# Patient Record
Sex: Female | Born: 1935 | Race: White | Hispanic: No | Smoking: Never smoker
Health system: Southern US, Community
[De-identification: ages and names within clinical notes are randomized; demographics above are authoritative.]

## PROBLEM LIST (undated history)

## (undated) DIAGNOSIS — Z1239 Encounter for other screening for malignant neoplasm of breast: Secondary | ICD-10-CM

## (undated) DIAGNOSIS — Z803 Family history of malignant neoplasm of breast: Secondary | ICD-10-CM

## (undated) DIAGNOSIS — Z1211 Encounter for screening for malignant neoplasm of colon: Secondary | ICD-10-CM

## (undated) DIAGNOSIS — E669 Obesity, unspecified: Secondary | ICD-10-CM

## (undated) DIAGNOSIS — Z1389 Encounter for screening for other disorder: Secondary | ICD-10-CM

## (undated) DIAGNOSIS — N6019 Diffuse cystic mastopathy of unspecified breast: Secondary | ICD-10-CM

## (undated) DIAGNOSIS — I10 Essential (primary) hypertension: Secondary | ICD-10-CM

## (undated) DIAGNOSIS — I4891 Unspecified atrial fibrillation: Secondary | ICD-10-CM

## (undated) HISTORY — DX: Obesity, unspecified: E66.9

## (undated) HISTORY — DX: Unspecified atrial fibrillation: I48.91

## (undated) HISTORY — DX: Encounter for other screening for malignant neoplasm of breast: Z12.39

## (undated) HISTORY — DX: Family history of malignant neoplasm of breast: Z80.3

## (undated) HISTORY — DX: Encounter for screening for malignant neoplasm of colon: Z12.11

## (undated) HISTORY — DX: Encounter for screening for other disorder: Z13.89

## (undated) HISTORY — DX: Essential (primary) hypertension: I10

## (undated) HISTORY — DX: Diffuse cystic mastopathy of unspecified breast: N60.19

---
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
## 1981-01-25 HISTORY — PX: VARICOSE VEIN SURGERY: SHX832

## 1990-01-25 HISTORY — PX: BREAST BIOPSY: SHX20

## 1994-01-25 HISTORY — PX: BREAST BIOPSY: SHX20

## 2002-01-25 HISTORY — PX: COLONOSCOPY W/ POLYPECTOMY: SHX1380

## 2003-01-26 HISTORY — PX: BREAST BIOPSY: SHX20

## 2004-04-14 ENCOUNTER — Ambulatory Visit: Payer: Self-pay | Admitting: General Surgery

## 2005-01-25 HISTORY — PX: COLONOSCOPY: SHX174

## 2005-04-27 ENCOUNTER — Ambulatory Visit: Payer: Self-pay | Admitting: General Surgery

## 2005-05-11 ENCOUNTER — Ambulatory Visit: Payer: Self-pay | Admitting: Gastroenterology

## 2006-05-10 ENCOUNTER — Ambulatory Visit: Payer: Self-pay | Admitting: General Surgery

## 2007-05-30 ENCOUNTER — Ambulatory Visit: Payer: Self-pay | Admitting: General Surgery

## 2008-06-04 ENCOUNTER — Ambulatory Visit: Payer: Self-pay | Admitting: General Surgery

## 2009-06-06 ENCOUNTER — Ambulatory Visit: Payer: Self-pay | Admitting: General Surgery

## 2010-06-08 ENCOUNTER — Ambulatory Visit: Payer: Self-pay | Admitting: General Surgery

## 2011-01-26 DIAGNOSIS — E669 Obesity, unspecified: Secondary | ICD-10-CM

## 2011-01-26 DIAGNOSIS — Z803 Family history of malignant neoplasm of breast: Secondary | ICD-10-CM

## 2011-01-26 DIAGNOSIS — Z1389 Encounter for screening for other disorder: Secondary | ICD-10-CM

## 2011-01-26 HISTORY — PX: COLONOSCOPY: SHX174

## 2011-01-26 HISTORY — DX: Family history of malignant neoplasm of breast: Z80.3

## 2011-01-26 HISTORY — DX: Obesity, unspecified: E66.9

## 2011-01-26 HISTORY — DX: Encounter for screening for other disorder: Z13.89

## 2011-04-19 ENCOUNTER — Ambulatory Visit: Payer: Self-pay | Admitting: Gastroenterology

## 2011-04-21 LAB — PATHOLOGY REPORT

## 2011-06-09 ENCOUNTER — Ambulatory Visit: Payer: Self-pay | Admitting: General Surgery

## 2011-06-18 DIAGNOSIS — Z1239 Encounter for other screening for malignant neoplasm of breast: Secondary | ICD-10-CM

## 2011-06-18 HISTORY — DX: Encounter for other screening for malignant neoplasm of breast: Z12.39

## 2011-06-21 DIAGNOSIS — Z1211 Encounter for screening for malignant neoplasm of colon: Secondary | ICD-10-CM

## 2011-06-21 HISTORY — DX: Encounter for screening for malignant neoplasm of colon: Z12.11

## 2012-04-08 ENCOUNTER — Encounter: Payer: Self-pay | Admitting: *Deleted

## 2012-04-08 DIAGNOSIS — Z803 Family history of malignant neoplasm of breast: Secondary | ICD-10-CM | POA: Insufficient documentation

## 2012-04-26 ENCOUNTER — Encounter: Payer: Self-pay | Admitting: General Surgery

## 2012-06-09 ENCOUNTER — Ambulatory Visit: Payer: Self-pay | Admitting: General Surgery

## 2012-06-14 ENCOUNTER — Encounter: Payer: Self-pay | Admitting: General Surgery

## 2012-06-29 ENCOUNTER — Ambulatory Visit (INDEPENDENT_AMBULATORY_CARE_PROVIDER_SITE_OTHER): Payer: Medicare Other | Admitting: General Surgery

## 2012-06-29 ENCOUNTER — Encounter: Payer: Self-pay | Admitting: General Surgery

## 2012-06-29 VITALS — BP 126/70 | HR 88 | Resp 16 | Ht 68.0 in | Wt 216.0 lb

## 2012-06-29 DIAGNOSIS — N6019 Diffuse cystic mastopathy of unspecified breast: Secondary | ICD-10-CM | POA: Insufficient documentation

## 2012-06-29 DIAGNOSIS — Z803 Family history of malignant neoplasm of breast: Secondary | ICD-10-CM

## 2012-06-29 NOTE — Progress Notes (Signed)
Patient ID: Vanessa Logan, female   DOB: 12/28/1926, 79 y.o.   MRN: [id number]  Chief Complaint  Patient presents with  . Follow-up    mammogram    HPI Vanessa Logan is a 79 y.o. female.  Patient here today for follow up mammogram with fibrocystic breast disease.  Noticed a "burning sensation" in left breast after mammogram lasting several minutes.  She has a known history of multiple left and right breast biopsies in the past all benign. Three sister with breast cancer two have passed away. HPI  Past Medical History  Diagnosis Date  . Unspecified essential hypertension   . Diffuse cystic mastopathy   . Breast screening, unspecified 06/18/11  . Special screening for malignant neoplasms, colon 06/21/2011  . Obesity, unspecified 2013  . Screening for obesity 2013  . Family history of malignant neoplasm of breast 2013    Past Surgical History  Procedure Laterality Date  . Colonoscopy  2007  . Colonoscopy w/ polypectomy  2004  . Varicose vein surgery  1983  . Appendectomy  1950  . Colonoscopy  2013    Dr Ricki Rodriguez  . Breast biopsy  1992    left and right  . Breast biopsy Right 1996  . Breast biopsy Right 2005    Family History  Problem Relation Age of Onset  . Breast cancer Sister     2 sisters with breast cancer  . Pancreatic cancer Sister   . Colon cancer Daughter     Social History History  Substance Use Topics  . Smoking status: Never Smoker   . Smokeless tobacco: Not on file  . Alcohol Use: No    Allergies  Allergen Reactions  . Neosporin (Neomycin-Bacitracin Zn-Polymyx) Itching and Swelling  . Keflex (Cephalexin) Rash    Current Outpatient Prescriptions  Medication Sig Dispense Refill  . aspirin 81 MG tablet Take 81 mg by mouth daily.      . hydrochlorothiazide (HYDRODIURIL) 50 MG tablet Take 50 mg by mouth daily.      Marland Kitchen KLOR-CON M20 20 MEQ tablet       . oxybutynin (DITROPAN-XL) 10 MG 24 hr tablet Take 20 mg by mouth daily.      . valsartan (DIOVAN) 80 MG  tablet Take 80 mg by mouth daily.       No current facility-administered medications for this visit.    Review of Systems Review of Systems  Constitutional: Negative.   Respiratory: Negative.   Cardiovascular: Negative.     Blood pressure 126/70, pulse 88, resp. rate 16, height 5\' 8"  (1.727 m), weight 216 lb (97.977 kg).  Physical Exam Physical Exam  Constitutional: She is oriented to person, place, and time. She appears well-developed and well-nourished.  Eyes: Conjunctivae are normal.  Cardiovascular: Normal rate and regular rhythm.   Pulses:      Dorsalis pedis pulses are 2+ on the right side, and 2+ on the left side.       Posterior tibial pulses are 2+ on the right side, and 2+ on the left side.  Mild lower extremity edema bilaterally  Pulmonary/Chest: Effort normal and breath sounds normal. Right breast exhibits no inverted nipple, no mass, no nipple discharge, no skin change and no tenderness. Left breast exhibits no inverted nipple, no mass, no nipple discharge, no skin change and no tenderness.  Abdominal: Soft. There is no hepatosplenomegaly or hepatomegaly. No hernia.  Lymphadenopathy:    She has no cervical adenopathy.    She has no axillary adenopathy.  Neurological: She is alert and oriented to person, place, and time.  Skin: Skin is warm and dry.    Data Reviewed Mammogram reviewed.  There is prominence of nodularity in right breast. BIRADS 3  Assessment    Multiple nodules right breast, family history of breast cancer and fibrocystic disease.  Exam is unremarkable    Plan    6 months follow up right mammogram        SANKAR,SEEPLAPUTHUR G 06/29/2012, 8:58 PM

## 2012-06-29 NOTE — Patient Instructions (Addendum)
Continue self breast exams. Call office for any new breast issues or concerns.  6 months for right breast mammogram

## 2013-01-08 ENCOUNTER — Ambulatory Visit: Payer: Self-pay | Admitting: General Surgery

## 2013-01-11 ENCOUNTER — Encounter: Payer: Self-pay | Admitting: General Surgery

## 2013-01-23 ENCOUNTER — Ambulatory Visit: Payer: Medicare Other | Admitting: General Surgery

## 2013-01-29 ENCOUNTER — Ambulatory Visit (INDEPENDENT_AMBULATORY_CARE_PROVIDER_SITE_OTHER): Payer: Medicare Other | Admitting: General Surgery

## 2013-01-29 ENCOUNTER — Encounter: Payer: Self-pay | Admitting: General Surgery

## 2013-01-29 VITALS — BP 144/78 | HR 72 | Resp 16 | Ht 68.0 in | Wt 213.0 lb

## 2013-01-29 DIAGNOSIS — N6019 Diffuse cystic mastopathy of unspecified breast: Secondary | ICD-10-CM

## 2013-01-29 DIAGNOSIS — Z803 Family history of malignant neoplasm of breast: Secondary | ICD-10-CM

## 2013-01-29 NOTE — Progress Notes (Signed)
Patient ID: Vanessa Logan, female   DOB: 01/18/1927, 80 y.o.   MRN: [id number]  Chief Complaint  Patient presents with  . Follow-up    mammogram    HPI Vanessa Logan is a 80 y.o. female who presents for a breast evaluation. The most recent mammogram was done on 01/09/13 at [facility]. Patient does perform regular self breast checks and gets regular mammograms done. Patient reports no new breast issues.  HPI  Past Medical History  Diagnosis Date  . Unspecified essential hypertension   . Diffuse cystic mastopathy   . Breast screening, unspecified 06/18/11  . Special screening for malignant neoplasms, colon 06/21/2011  . Obesity, unspecified 2013  . Screening for obesity 2013  . Family history of malignant neoplasm of breast 2013    Past Surgical History  Procedure Laterality Date  . Colonoscopy  2007  . Colonoscopy w/ polypectomy  2004  . Varicose vein surgery  1983  . Appendectomy  1950  . Colonoscopy  2013    Dr Ricki Rodriguez  . Breast biopsy  1992    left and right  . Breast biopsy Right 1996  . Breast biopsy Right 2005    Family History  Problem Relation Age of Onset  . Breast cancer Sister     2 sisters with breast cancer  . Pancreatic cancer Sister   . Colon cancer Daughter     Social History History  Substance Use Topics  . Smoking status: Never Smoker   . Smokeless tobacco: Not on file  . Alcohol Use: No    Allergies  Allergen Reactions  . Neosporin [Neomycin-Bacitracin Zn-Polymyx] Itching and Swelling  . Keflex [Cephalexin] Rash    Current Outpatient Prescriptions  Medication Sig Dispense Refill  . aspirin 81 MG tablet Take 81 mg by mouth daily.      . hydrochlorothiazide (HYDRODIURIL) 50 MG tablet Take 50 mg by mouth daily.      Marland Kitchen KLOR-CON M20 20 MEQ tablet       . oxybutynin (DITROPAN-XL) 10 MG 24 hr tablet Take 20 mg by mouth daily.      . valsartan (DIOVAN) 80 MG tablet Take 80 mg by mouth daily.       No current facility-administered medications for this  visit.    Review of Systems Review of Systems  Constitutional: Negative.   Respiratory: Negative.   Cardiovascular: Negative.     Blood pressure 144/78, pulse 72, resp. rate 16, height 5\' 8"  (1.727 m), weight 213 lb (96.616 kg).  Physical Exam Physical Exam  Constitutional: She is oriented to person, place, and time. She appears well-developed and well-nourished.  Eyes: Conjunctivae are normal. No scleral icterus.  Neck: Neck supple. No mass and no thyromegaly present.  Cardiovascular: Normal rate, regular rhythm and normal heart sounds.   Pulmonary/Chest: Breath sounds normal. Right breast exhibits no inverted nipple, no mass, no nipple discharge, no skin change and no tenderness. Left breast exhibits no inverted nipple, no mass, no nipple discharge, no skin change and no tenderness.  Lymphadenopathy:    She has no cervical adenopathy.    She has no axillary adenopathy.  Neurological: She is alert and oriented to person, place, and time.  Skin: Skin is warm and dry.    Data Reviewed Mammogram reviewed. Multiple nodular densities and a dilated duct in right breast -all stable from 6 mos ago.   Assessment    Stable exam. Histrory of FCD and prior biopsies    Plan    Follow up  appointment to be announced. She will need bil mammogram in 6 mos-not sure she needs diagnostic. Will discuss with radiologist.        Kieth BrightlySANKAR,SEEPLAPUTHUR G 01/30/2013, 7:45 PM

## 2013-01-29 NOTE — Patient Instructions (Signed)
Follow up appointment to be announced.  

## 2013-01-30 ENCOUNTER — Encounter: Payer: Self-pay | Admitting: General Surgery

## 2013-03-01 ENCOUNTER — Ambulatory Visit: Payer: Self-pay | Admitting: Internal Medicine

## 2013-03-04 ENCOUNTER — Emergency Department: Payer: Self-pay | Admitting: Emergency Medicine

## 2013-03-04 LAB — COMPREHENSIVE METABOLIC PANEL
ALK PHOS: 96 U/L
Albumin: 2.9 g/dL — ABNORMAL LOW (ref 3.4–5.0)
Anion Gap: 2 — ABNORMAL LOW (ref 7–16)
BILIRUBIN TOTAL: 0.6 mg/dL (ref 0.2–1.0)
BUN: 23 mg/dL — AB (ref 7–18)
CO2: 30 mmol/L (ref 21–32)
CREATININE: 1.18 mg/dL (ref 0.60–1.30)
Calcium, Total: 8.9 mg/dL (ref 8.5–10.1)
Chloride: 108 mmol/L — ABNORMAL HIGH (ref 98–107)
GFR CALC AF AMER: 49 — AB
GFR CALC NON AF AMER: 42 — AB
Glucose: 100 mg/dL — ABNORMAL HIGH (ref 65–99)
OSMOLALITY: 283 (ref 275–301)
POTASSIUM: 4 mmol/L (ref 3.5–5.1)
SGOT(AST): 23 U/L (ref 15–37)
SGPT (ALT): 23 U/L (ref 12–78)
Sodium: 140 mmol/L (ref 136–145)
Total Protein: 7.3 g/dL (ref 6.4–8.2)

## 2013-03-04 LAB — CBC WITH DIFFERENTIAL/PLATELET
BASOS ABS: 0.1 10*3/uL (ref 0.0–0.1)
Basophil %: 2.5 %
EOS ABS: 0.1 10*3/uL (ref 0.0–0.7)
EOS PCT: 2.1 %
HCT: 38.1 % (ref 35.0–47.0)
HGB: 12.4 g/dL (ref 12.0–16.0)
LYMPHS ABS: 1.2 10*3/uL (ref 1.0–3.6)
Lymphocyte %: 20.5 %
MCH: 26.6 pg (ref 26.0–34.0)
MCHC: 32.7 g/dL (ref 32.0–36.0)
MCV: 82 fL (ref 80–100)
MONO ABS: 0.6 x10 3/mm (ref 0.2–0.9)
MONOS PCT: 10.5 %
NEUTROS ABS: 3.9 10*3/uL (ref 1.4–6.5)
Neutrophil %: 64.4 %
PLATELETS: 182 10*3/uL (ref 150–440)
RBC: 4.67 10*6/uL (ref 3.80–5.20)
RDW: 14 % (ref 11.5–14.5)
WBC: 6 10*3/uL (ref 3.6–11.0)

## 2013-03-04 LAB — TROPONIN I

## 2013-09-06 ENCOUNTER — Ambulatory Visit: Payer: Self-pay | Admitting: General Surgery

## 2013-09-06 ENCOUNTER — Encounter: Payer: Self-pay | Admitting: General Surgery

## 2013-09-24 ENCOUNTER — Ambulatory Visit (INDEPENDENT_AMBULATORY_CARE_PROVIDER_SITE_OTHER): Payer: Medicare Other | Admitting: General Surgery

## 2013-09-24 ENCOUNTER — Encounter: Payer: Self-pay | Admitting: General Surgery

## 2013-09-24 VITALS — BP 132/68 | HR 70 | Resp 12 | Ht 67.0 in | Wt 217.0 lb

## 2013-09-24 DIAGNOSIS — Z803 Family history of malignant neoplasm of breast: Secondary | ICD-10-CM

## 2013-09-24 DIAGNOSIS — N6019 Diffuse cystic mastopathy of unspecified breast: Secondary | ICD-10-CM

## 2013-09-24 NOTE — Progress Notes (Signed)
.Patient ID: Vanessa Logan, female   DOB: Dec 01, 1926, 80 y.o.   MRN: [id number]  Chief Complaint  Patient presents with  . Follow-up    mammogram    HPI Vanessa Logan is a 80 y.o. female who presents for a breast evaluation. The most recent mammogram was done on 09/06/13 .  Patient does perform regular self breast checks and gets regular mammograms done.    HPI  Past Medical History  Diagnosis Date  . Unspecified essential hypertension   . Diffuse cystic mastopathy   . Breast screening, unspecified 06/18/11  . Special screening for malignant neoplasms, colon 06/21/2011  . Obesity, unspecified 2013  . Screening for obesity 2013  . Family history of malignant neoplasm of breast 2013    Past Surgical History  Procedure Laterality Date  . Colonoscopy  2007  . Colonoscopy w/ polypectomy  2004  . Varicose vein surgery  1983  . Appendectomy  1950  . Colonoscopy  2013    Dr Ricki Rodriguez  . Breast biopsy  1992    left and right  . Breast biopsy Right 1996  . Breast biopsy Right 2005    Family History  Problem Relation Age of Onset  . Breast cancer Sister     2 sisters with breast cancer  . Pancreatic cancer Sister   . Colon cancer Daughter     Social History History  Substance Use Topics  . Smoking status: Never Smoker   . Smokeless tobacco: Not on file  . Alcohol Use: No    Allergies  Allergen Reactions  . Neosporin [Neomycin-Bacitracin Zn-Polymyx] Itching and Swelling  . Keflex [Cephalexin] Rash    Current Outpatient Prescriptions  Medication Sig Dispense Refill  . aspirin 81 MG tablet Take 81 mg by mouth daily.      . hydrochlorothiazide (HYDRODIURIL) 50 MG tablet Take 50 mg by mouth daily.      Marland Kitchen KLOR-CON M20 20 MEQ tablet       . losartan (COZAAR) 50 MG tablet 50 mg daily.       . metoprolol succinate (TOPROL-XL) 25 MG 24 hr tablet Take 12.5 mg by mouth daily.       Marland Kitchen oxybutynin (DITROPAN-XL) 10 MG 24 hr tablet Take 20 mg by mouth daily.      . valsartan (DIOVAN)  80 MG tablet Take 80 mg by mouth daily.       No current facility-administered medications for this visit.    Review of Systems Review of Systems  Constitutional: Negative.   Respiratory: Negative.   Cardiovascular: Negative.     Blood pressure 132/68, pulse 70, resp. rate 12, height  (1.702 m), weight 217 lb (98.431 kg).  Physical Exam Physical Exam  Constitutional: She is oriented to person, place, and time. She appears well-nourished.  Eyes: Conjunctivae are normal. No scleral icterus.  Neck: Neck supple. No mass and no thyromegaly present.  Cardiovascular: Normal rate, regular rhythm and normal heart sounds.   Pulmonary/Chest: Effort normal and breath sounds normal. Right breast exhibits no inverted nipple, no mass, no nipple discharge, no skin change and no tenderness. Left breast exhibits no inverted nipple, no mass, no nipple discharge, no skin change and no tenderness.  Abdominal: Soft. Bowel sounds are normal. There is no tenderness.  Lymphadenopathy:    She has no cervical adenopathy.    She has no axillary adenopathy.  Neurological: She is alert and oriented to person, place, and time.  Skin: Skin is warm and dry.  Data Reviewed Mammogram reviewed and stable.  Assessment    Stable exam. History of FCD. FH of breast cancer.      Plan    The patient has been asked to return to the office in one year with  a bilateral screening  mammogram.       Bodhi Stenglein G 09/25/2013, 4:06 PM

## 2013-09-24 NOTE — Patient Instructions (Signed)
The patient has been asked to return to the office in one year with a bilateral diagnostic mammogram.Continue self breast exams. Call office for any new breast issues or concerns.  

## 2013-09-25 ENCOUNTER — Encounter: Payer: Self-pay | Admitting: General Surgery

## 2013-11-26 ENCOUNTER — Encounter: Payer: Self-pay | Admitting: General Surgery

## 2014-06-07 ENCOUNTER — Emergency Department: Payer: Medicare Other

## 2014-06-07 DIAGNOSIS — Y9389 Activity, other specified: Secondary | ICD-10-CM | POA: Diagnosis not present

## 2014-06-07 DIAGNOSIS — Z7982 Long term (current) use of aspirin: Secondary | ICD-10-CM | POA: Diagnosis not present

## 2014-06-07 DIAGNOSIS — S301XXA Contusion of abdominal wall, initial encounter: Secondary | ICD-10-CM | POA: Insufficient documentation

## 2014-06-07 DIAGNOSIS — Z79899 Other long term (current) drug therapy: Secondary | ICD-10-CM | POA: Insufficient documentation

## 2014-06-07 DIAGNOSIS — Y998 Other external cause status: Secondary | ICD-10-CM | POA: Diagnosis not present

## 2014-06-07 DIAGNOSIS — Y9289 Other specified places as the place of occurrence of the external cause: Secondary | ICD-10-CM | POA: Insufficient documentation

## 2014-06-07 DIAGNOSIS — W010XXA Fall on same level from slipping, tripping and stumbling without subsequent striking against object, initial encounter: Secondary | ICD-10-CM | POA: Diagnosis not present

## 2014-06-07 DIAGNOSIS — S3991XA Unspecified injury of abdomen, initial encounter: Secondary | ICD-10-CM | POA: Diagnosis present

## 2014-06-07 DIAGNOSIS — S299XXA Unspecified injury of thorax, initial encounter: Secondary | ICD-10-CM | POA: Diagnosis not present

## 2014-06-07 NOTE — ED Notes (Signed)
Pt lost balance and fell hitting left rib area on table, co pain to area more with movement. No head injury or neck  Pain.

## 2014-06-08 ENCOUNTER — Emergency Department: Payer: Medicare Other

## 2014-06-08 ENCOUNTER — Emergency Department
Admission: EM | Admit: 2014-06-08 | Discharge: 2014-06-08 | Disposition: A | Discharge status: Home / Self Care | Payer: Medicare Other | Attending: Emergency Medicine | Admitting: Emergency Medicine

## 2014-06-08 DIAGNOSIS — R0781 Pleurodynia: Secondary | ICD-10-CM

## 2014-06-08 DIAGNOSIS — S301XXA Contusion of abdominal wall, initial encounter: Secondary | ICD-10-CM | POA: Diagnosis not present

## 2014-06-08 DIAGNOSIS — W19XXXA Unspecified fall, initial encounter: Secondary | ICD-10-CM

## 2014-06-08 LAB — COMPREHENSIVE METABOLIC PANEL
ALBUMIN: 3.4 g/dL — AB (ref 3.5–5.0)
ALK PHOS: 94 U/L (ref 38–126)
ALT: 21 U/L (ref 14–54)
ANION GAP: 7 (ref 5–15)
AST: 23 U/L (ref 15–41)
BILIRUBIN TOTAL: 0.9 mg/dL (ref 0.3–1.2)
BUN: 34 mg/dL — AB (ref 6–20)
CO2: 29 mmol/L (ref 22–32)
Calcium: 8.9 mg/dL (ref 8.9–10.3)
Chloride: 104 mmol/L (ref 101–111)
Creatinine, Ser: 0.92 mg/dL (ref 0.44–1.00)
GFR calc non Af Amer: 55 mL/min — ABNORMAL LOW (ref >60)
GLUCOSE: 93 mg/dL (ref 65–99)
Potassium: 3.1 mmol/L — ABNORMAL LOW (ref 3.5–5.1)
SODIUM: 140 mmol/L (ref 135–145)
TOTAL PROTEIN: 7.5 g/dL (ref 6.5–8.1)

## 2014-06-08 LAB — PROTIME-INR
INR: 1.45
PROTHROMBIN TIME: 17.8 s — AB (ref 11.4–15.0)

## 2014-06-08 LAB — URINALYSIS COMPLETE WITH MICROSCOPIC (ARMC ONLY)
Bilirubin Urine: NEGATIVE
Glucose, UA: NEGATIVE mg/dL
Ketones, ur: NEGATIVE mg/dL
Leukocytes, UA: NEGATIVE
Nitrite: POSITIVE — AB
Protein, ur: NEGATIVE mg/dL
Specific Gravity, Urine: 1.01 (ref 1.005–1.030)
Squamous Epithelial / LPF: NONE SEEN
pH: 5 (ref 5.0–8.0)

## 2014-06-08 LAB — CBC WITH DIFFERENTIAL/PLATELET
BASOS ABS: 0 10*3/uL (ref 0–0.1)
BASOS PCT: 0 %
Eosinophils Absolute: 0.1 10*3/uL (ref 0–0.7)
Eosinophils Relative: 1 %
HCT: 39.3 % (ref 35.0–47.0)
Hemoglobin: 13.2 g/dL (ref 12.0–16.0)
LYMPHS ABS: 1.5 10*3/uL (ref 1.0–3.6)
Lymphocytes Relative: 21 %
MCH: 27 pg (ref 26.0–34.0)
MCHC: 33.5 g/dL (ref 32.0–36.0)
MCV: 80.8 fL (ref 80.0–100.0)
Monocytes Absolute: 0.8 10*3/uL (ref 0.2–0.9)
Monocytes Relative: 10 %
NEUTROS ABS: 5 10*3/uL (ref 1.4–6.5)
Neutrophils Relative %: 68 %
PLATELETS: 192 10*3/uL (ref 150–440)
RBC: 4.87 MIL/uL (ref 3.80–5.20)
RDW: 13.9 % (ref 11.5–14.5)
WBC: 7.4 10*3/uL (ref 3.6–11.0)

## 2014-06-08 MED ORDER — SODIUM CHLORIDE 0.9 % IV BOLUS (SEPSIS)
1000.0000 mL | Freq: Once | INTRAVENOUS | Status: AC
  Administered 2014-06-08: 1000 mL via INTRAVENOUS

## 2014-06-08 MED ORDER — HYDROCODONE-ACETAMINOPHEN 5-325 MG PO TABS: 0.5000 | ORAL_TABLET | Freq: Four times a day (QID) | ORAL | Status: DC | PRN

## 2014-06-08 MED ORDER — IOHEXOL 300 MG/ML  SOLN
100.0000 mL | Freq: Once | INTRAMUSCULAR | Status: AC | PRN
  Administered 2014-06-08: 100 mL via INTRAVENOUS

## 2014-06-08 NOTE — Discharge Instructions (Signed)
1. Take pain medicine as needed (Norco #15). 2. Make sure to take deep breaths throughout the day to keep your lungs expanded to prevent pneumonia. 3. Return to the ER for worsening symptoms, fever, productive cough, persistent vomiting, difficulty breathing or other concerns.  Fall Prevention and Home Safety Falls cause injuries and can affect all age groups. It is possible to prevent falls.  HOW TO PREVENT FALLS  Wear shoes with rubber soles that do not have an opening for your toes.  Keep the inside and outside of your house well lit.  Use night lights throughout your home.  Remove clutter from floors.  Clean up floor spills.  Remove throw rugs or fasten them to the floor with carpet tape.  Do not place electrical cords across pathways.  Put grab bars by your tub, shower, and toilet. Do not use towel bars as grab bars.  Put handrails on both sides of the stairway. Fix loose handrails.  Do not climb on stools or stepladders, if possible.  Do not wax your floors.  Repair uneven or unsafe sidewalks, walkways, or stairs.  Keep items you use a lot within reach.  Be aware of pets.  Keep emergency numbers next to the telephone.  Put smoke detectors in your home and near bedrooms. Ask your doctor what other things you can do to prevent falls. Document Released: 11/07/2008 Document Revised: 07/13/2011 Document Reviewed: 04/13/2011 [facility] Patient Information 2015 [place], [place]. This information is not intended to replace advice given to you by your health care provider. Make sure you discuss any questions you have with your health care provider.  Hematoma A hematoma is a collection of blood under the skin, in an organ, in a body space, in a joint space, or in other tissue. The blood can clot to form a lump that you can see and feel. The lump is often firm and may sometimes become sore and tender. Most hematomas get better in a few days to weeks. However, some hematomas may be  serious and require medical care. Hematomas can range in size from very small to very large. CAUSES  A hematoma can be caused by a blunt or penetrating injury. It can also be caused by spontaneous leakage from a blood vessel under the skin. Spontaneous leakage from a blood vessel is more likely to occur in older people, especially those taking blood thinners. Sometimes, a hematoma can develop after certain medical procedures. SIGNS AND SYMPTOMS   A firm lump on the body.  Possible pain and tenderness in the area.  Bruising.Blue, dark blue, purple-red, or yellowish skin may appear at the site of the hematoma if the hematoma is close to the surface of the skin. For hematomas in deeper tissues or body spaces, the signs and symptoms may be subtle. For example, an intra-abdominal hematoma may cause abdominal pain, weakness, fainting, and shortness of breath. An intracranial hematoma may cause a headache or symptoms such as weakness, trouble speaking, or a change in consciousness. DIAGNOSIS  A hematoma can usually be diagnosed based on your medical history and a physical exam. Imaging tests may be needed if your health care provider suspects a hematoma in deeper tissues or body spaces, such as the abdomen, head, or chest. These tests may include ultrasonography or a CT scan.  TREATMENT  Hematomas usually go away on their own over time. Rarely does the blood need to be drained out of the body. Large hematomas or those that may affect vital organs will sometimes  need surgical drainage or monitoring. HOME CARE INSTRUCTIONS   Apply ice to the injured area:   Put ice in a plastic bag.   Place a towel between your skin and the bag.   Leave the ice on for 20 minutes, 2-3 times a day for the first 1 to 2 days.   After the first 2 days, switch to using warm compresses on the hematoma.   Elevate the injured area to help decrease pain and swelling. Wrapping the area with an elastic bandage may also  be helpful. Compression helps to reduce swelling and promotes shrinking of the hematoma. Make sure the bandage is not wrapped too tight.   If your hematoma is on a lower extremity and is painful, crutches may be helpful for a couple days.   Only take over-the-counter or prescription medicines as directed by your health care provider. SEEK IMMEDIATE MEDICAL CARE IF:   You have increasing pain, or your pain is not controlled with medicine.   You have a fever.   You have worsening swelling or discoloration.   Your skin over the hematoma breaks or starts bleeding.   Your hematoma is in your chest or abdomen and you have weakness, shortness of breath, or a change in consciousness.  Your hematoma is on your scalp (caused by a fall or injury) and you have a worsening headache or a change in alertness or consciousness. MAKE SURE YOU:   Understand these instructions.  Will watch your condition.  Will get help right away if you are not doing well or get worse. Document Released: 08/26/2003 Document Revised: 09/13/2012 Document Reviewed: 06/21/2012 [facility]ahExitCare Patient Information 2015 [place], [place]. This information is not intended to replace advice given to you by your health care provider. Make sure you discuss any questions you have with your health care provider.  Chest Wall Pain Chest wall pain is pain in or around the bones and muscles of your chest. It may take up to 6 weeks to get better. It may take longer if you must stay physically active in your work and activities.  CAUSES  Chest wall pain may happen on its own. However, it may be caused by:  A viral illness like the flu.  Injury.  Coughing.  Exercise.  Arthritis.  Fibromyalgia.  Shingles. HOME CARE INSTRUCTIONS   Avoid overtiring physical activity. Try not to strain or perform activities that cause pain. This includes any activities using your chest or your abdominal and side muscles, especially if heavy  weights are used.  Put ice on the sore area.  Put ice in a plastic bag.  Place a towel between your skin and the bag.  Leave the ice on for 15-20 minutes per hour while awake for the first 2 days.  Only take over-the-counter or prescription medicines for pain, discomfort, or fever as directed by your caregiver. SEEK IMMEDIATE MEDICAL CARE IF:   Your pain increases, or you are very uncomfortable.  You have a fever.  Your chest pain becomes worse.  You have new, unexplained symptoms.  You have nausea or vomiting.  You feel sweaty or lightheaded.  You have a cough with phlegm (sputum), or you cough up blood. MAKE SURE YOU:   Understand these instructions.  Will watch your condition.  Will get help right away if you are not doing well or get worse. Document Released: 01/11/2005 Document Revised: 04/05/2011 Document Reviewed: 09/07/2010 [facility]linExitCare Patient Information 2015 [place]ExitCare, [place]. This information is not intended to replace advice given  to you by your health care provider. Make sure you discuss any questions you have with your health care provider.  Contusion A contusion is a deep bruise. Contusions happen when an injury causes bleeding under the skin. Signs of bruising include pain, puffiness (swelling), and discolored skin. The contusion may turn blue, purple, or yellow. HOME CARE   Put ice on the injured area.  Put ice in a plastic bag.  Place a towel between your skin and the bag.  Leave the ice on for 15-20 minutes, 03-04 times a day.  Only take medicine as told by your doctor.  Rest the injured area.  If possible, raise (elevate) the injured area to lessen puffiness. GET HELP RIGHT AWAY IF:   You have more bruising or puffiness.  You have pain that is getting worse.  Your puffiness or pain is not helped by medicine. MAKE SURE YOU:   Understand these instructions.  Will watch your condition.  Will get help right away if you are not doing well or  get worse. Document Released: 06/30/2007 Document Revised: 04/05/2011 Document Reviewed: 11/16/2010 [facility] Patient Information 2015 [place], [place]. This information is not intended to replace advice given to you by your health care provider. Make sure you discuss any questions you have with your health care provider.

## 2014-06-08 NOTE — ED Notes (Signed)
NAD noted at time of D/C. Pt denies questions or concerns. Pt taken to the lobby via wheelchair at this time.  

## 2014-06-08 NOTE — ED Notes (Signed)
Vital signs stable. Pt denies weakness and dizziness prior to fall. Pt states she was in a hurry to answer phone, did not use her cane, overbalanced and fell against her table. Pt presents w/ bruising to L flank and hip and pain in L knee. No bruising or contusion evident in L knee.

## 2014-06-08 NOTE — ED Notes (Signed)
Patient transported to CT 

## 2014-06-08 NOTE — ED Provider Notes (Signed)
[facility] Emergency Department Provider Note  ____________________________________________  Time seen: Approximately 5:44 AM  I have reviewed the triage vital signs and the nursing notes.   HISTORY  Chief Complaint Fall    HPI Vanessa Logan is a 81 y.o. female who presents status post fall prior to arrival. Patient relates mechanical fall-states she was in a hurry to answer her telephone, did not use her cane, lost her balance and fell first onto her knees then her left side struck a table. Patient denies head injury or LOC. Patient complains of 2/10 pain to left chest and flank. Patient is on Pradaxa. Denies headache, neck pain, dizziness, nausea, vomiting, diarrhea, shortness of breath, hematuria.   Past Medical History  Diagnosis Date  . Unspecified essential hypertension   . Diffuse cystic mastopathy   . Breast screening, unspecified 06/18/11  . Special screening for malignant neoplasms, colon 06/21/2011  . Obesity, unspecified 2013  . Screening for obesity 2013  . Family history of malignant neoplasm of breast 2013    Patient Active Problem List   Diagnosis Date Noted  . Fibrocystic breast disease 06/29/2012  . Family history of malignant neoplasm of breast     Past Surgical History  Procedure Laterality Date  . Colonoscopy  2007  . Colonoscopy w/ polypectomy  2004  . Varicose vein surgery  1983  . Appendectomy  1950  . Colonoscopy  2013    Dr Ricki Rodriguez  . Breast biopsy  1992    left and right  . Breast biopsy Right 1996  . Breast biopsy Right 2005    Current Outpatient Rx  Name  Route  Sig  Dispense  Refill  . aspirin 81 MG tablet   Oral   Take 81 mg by mouth daily.         . hydrochlorothiazide (HYDRODIURIL) 50 MG tablet   Oral   Take 50 mg by mouth daily.         Marland Kitchen HYDROcodone-acetaminophen (NORCO) 5-325 MG per tablet   Oral   Take 0.5 tablets by mouth every 6 (six) hours as needed for moderate pain.   15 tablet   0    . KLOR-CON M20 20 MEQ tablet               . losartan (COZAAR) 50 MG tablet      50 mg daily.          . metoprolol succinate (TOPROL-XL) 25 MG 24 hr tablet   Oral   Take 12.5 mg by mouth daily.          Marland Kitchen oxybutynin (DITROPAN-XL) 10 MG 24 hr tablet   Oral   Take 20 mg by mouth daily.         . valsartan (DIOVAN) 80 MG tablet   Oral   Take 80 mg by mouth daily.           Allergies Neosporin and Keflex  Family History  Problem Relation Age of Onset  . Breast cancer Sister     2 sisters with breast cancer  . Pancreatic cancer Sister   . Colon cancer Daughter     Social History History  Substance Use Topics  . Smoking status: Never Smoker   . Smokeless tobacco: Not on file  . Alcohol Use: No    Review of Systems Constitutional: No fever/chills Eyes: No visual changes. ENT: No sore throat. Cardiovascular: Positive for left lower chest pain. Respiratory: Denies shortness of breath. Gastrointestinal: No  abdominal pain.  No nausea, no vomiting.  No diarrhea.  No constipation. Genitourinary: Positive for left flank contusion. Negative for dysuria. Musculoskeletal: Negative for back pain. Skin: Negative for rash. Neurological: Negative for headaches, focal weakness or numbness.  10-point ROS otherwise negative.  ____________________________________________   PHYSICAL EXAM:  VITAL SIGNS: ED Triage Vitals  Enc Vitals Group     BP 06/07/14 2302 153/95 mmHg     Pulse Rate 06/07/14 2302 80     Resp 06/07/14 2302 18     Temp 06/07/14 2302 97.8 F (36.6 C)     Temp Source 06/07/14 2302 Oral     SpO2 06/07/14 2302 100 %     Weight 06/07/14 2302 212 lb (96.163 kg)     Height 06/07/14 2302 5\' 9"  (1.753 m)     Head Cir --      Peak Flow --      Pain Score 06/07/14 2303 0     Pain Loc --      Pain Edu? --      Excl. in GC? --     Constitutional: Alert and oriented. Well appearing and in no acute distress. Eyes: Conjunctivae are normal. PERRL.  EOMI. Head: Atraumatic. Nose: No congestion/rhinnorhea. Mouth/Throat: Mucous membranes are moist.  Oropharynx non-erythematous. Neck: No stridor. No cervical spine tenderness to palpation. Cardiovascular: Normal rate, regular rhythm. Grossly normal heart sounds.  Good peripheral circulation. Respiratory: Normal respiratory effort.  No retractions. Lungs CTAB. Left lateral lower ribs mildly tender to palpation. Gastrointestinal: Soft and nontender. No distention. No abdominal bruits. No CVA tenderness. Genitourinary: Palm-sized contusion/hematoma over left flank. Mildly tender to palpation. Musculoskeletal: No lower extremity tenderness nor edema.  No joint effusions. Neurologic:  Normal speech and language. No gross focal neurologic deficits are appreciated. Speech is normal. No gait instability. Skin:  Skin is warm, dry and intact. No rash noted. Psychiatric: Mood and affect are normal. Speech and behavior are normal.  ____________________________________________   LABS (all labs ordered are listed, but only abnormal results are displayed)  Labs Reviewed  COMPREHENSIVE METABOLIC PANEL - Abnormal; Notable for the following:    Potassium 3.1 (*)    BUN 34 (*)    Albumin 3.4 (*)    GFR calc non Af Amer 55 (*)    All other components within normal limits  PROTIME-INR - Abnormal; Notable for the following:    Prothrombin Time 17.8 (*)    All other components within normal limits  URINALYSIS COMPLETEWITH MICROSCOPIC ([facility])  - Abnormal; Notable for the following:    Color, Urine STRAW (*)    APPearance CLEAR (*)    Hgb urine dipstick 1+ (*)    Nitrite POSITIVE (*)    Bacteria, UA MANY (*)    All other components within normal limits  URINE CULTURE  CBC WITH DIFFERENTIAL/PLATELET   ____________________________________________  EKG  None ____________________________________________  RADIOLOGY  Chest 2 view (viewed by me, interpreted by Dr. Forde Radonchanging):  No displaced rib  fracture seen. Borderline cardiomegaly and mild peribronchial thickening. Lungs otherwise grossly clear.  CT chest/abdomen and pelvis interpreted by Dr. Oleta Mousermond:  1. No acute findings within the chest, abdomen or pelvis. Specifically, no evidence of acute injury. 2. Chronic findings as detailed above including small thyroid nodules, a large gallstone versus tumefactive sludge and a 3.9 cm right adnexal, presumed ovarian, cyst.  ____________________________________________   PROCEDURES  Procedure(s) performed: None  Critical Care performed: No  ____________________________________________   INITIAL IMPRESSION / ASSESSMENT AND PLAN /  ED COURSE  Pertinent labs & imaging results that were available during my care of the patient were reviewed by me and considered in my medical decision making (see chart for details).  81 year old female status post fall before midnight. Presents with left flank hematoma; on blood thinner. Declines analgesia at this time. Will obtain CT chest/abdomen/pelvis to evaluate intrathoracic/intra-abdominal bleeding.  ----------------------------------------- 7:11 AM on 06/08/2014 -----------------------------------------  Labs notable for mild hypokalemia which patient will supplement through diet. Urine notable for positive nitrites, 0-5 WBC. Asymptomatic patient; will add urine culture and start antibiotics if positive. Patient currently in CT scan.   ----------------------------------------- 8:03 AM on 06/08/2014 -----------------------------------------  Patient resting comfortably in no acute distress. Discussed CT findings with patient and son, including incidental findings of gallstones and ovarian cyst. Will prescribe mild analgesia. Strict return precautions given. Both verbalize understanding and agree with plan of care. ____________________________________________   FINAL CLINICAL IMPRESSION(S) / ED DIAGNOSES  Final diagnoses:  Fall,  initial encounter  Contusion, flank, initial encounter  Rib pain on left side      Irean HongJade J Eliyanna Ault, MD 06/08/14 [phone]

## 2014-08-05 ENCOUNTER — Other Ambulatory Visit: Payer: Self-pay

## 2014-08-05 DIAGNOSIS — Z1231 Encounter for screening mammogram for malignant neoplasm of breast: Secondary | ICD-10-CM

## 2014-09-09 ENCOUNTER — Other Ambulatory Visit: Payer: Self-pay | Admitting: General Surgery

## 2014-09-09 ENCOUNTER — Ambulatory Visit
Admission: RE | Admit: 2014-09-09 | Discharge: 2014-09-09 | Disposition: A | Discharge status: Home / Self Care | Payer: Medicare Other | Source: Ambulatory Visit | Attending: General Surgery | Admitting: General Surgery

## 2014-09-09 DIAGNOSIS — Z1231 Encounter for screening mammogram for malignant neoplasm of breast: Secondary | ICD-10-CM

## 2014-09-19 ENCOUNTER — Ambulatory Visit (INDEPENDENT_AMBULATORY_CARE_PROVIDER_SITE_OTHER): Payer: Medicare Other | Admitting: General Surgery

## 2014-09-19 ENCOUNTER — Encounter: Payer: Self-pay | Admitting: General Surgery

## 2014-09-19 VITALS — BP 132/88 | HR 78 | Resp 14 | Ht 67.0 in | Wt 202.0 lb

## 2014-09-19 DIAGNOSIS — N6019 Diffuse cystic mastopathy of unspecified breast: Secondary | ICD-10-CM | POA: Diagnosis not present

## 2014-09-19 DIAGNOSIS — Z803 Family history of malignant neoplasm of breast: Secondary | ICD-10-CM

## 2014-09-19 NOTE — Patient Instructions (Addendum)
Follow up in one year  Continue self breast exams. Call office for any new breast issues or concerns.  

## 2014-09-19 NOTE — Progress Notes (Signed)
Patient ID: Vanessa Logan, female   DOB: 1926/12/30, 81 y.o.   MRN: [id number]  Chief Complaint  Patient presents with  . Follow-up    mammogram    HPI Vanessa Logan is a 81 y.o. female who presents for a breast evaluation. The most recent mammogram was done on 09/09/14. Patient does perform regular self breast checks and gets regular mammograms done. She denies any new breast problems.    HPI  Past Medical History  Diagnosis Date  . Unspecified essential hypertension   . Diffuse cystic mastopathy   . Breast screening, unspecified 06/18/11  . Special screening for malignant neoplasms, colon 06/21/2011  . Obesity, unspecified 2013  . Screening for obesity 2013  . Family history of malignant neoplasm of breast 2013  . Atrial fibrillation     Past Surgical History  Procedure Laterality Date  . Colonoscopy  2007  . Colonoscopy w/ polypectomy  2004  . Varicose vein surgery  1983  . Appendectomy  1950  . Colonoscopy  2013    Dr Ricki Rodriguez  . Breast biopsy Bilateral 1992    neg  . Breast biopsy Right 1996    neg  . Breast biopsy Right 2005    neg    Family History  Problem Relation Age of Onset  . Breast cancer Sister 51  . Pancreatic cancer Sister   . Colon cancer Daughter   . Breast cancer Sister 65    Social History Social History  Substance Use Topics  . Smoking status: Never Smoker   . Smokeless tobacco: Never Used  . Alcohol Use: No    Allergies  Allergen Reactions  . Neosporin [Neomycin-Bacitracin Zn-Polymyx] Itching and Swelling  . Keflex [Cephalexin] Rash    Current Outpatient Prescriptions  Medication Sig Dispense Refill  . dabigatran (PRADAXA) 150 MG CAPS capsule Take 1 capsule by mouth 2 (two) times daily.    . hydrochlorothiazide (HYDRODIURIL) 50 MG tablet Take 50 mg by mouth daily.    Marland Kitchen losartan (COZAAR) 50 MG tablet Take 1 tablet by mouth daily.    . metoprolol succinate (TOPROL-XL) 25 MG 24 hr tablet Take 12.5 mg by mouth daily.    Marland Kitchen oxybutynin  (DITROPAN-XL) 10 MG 24 hr tablet Take 10 mg by mouth 2 (two) times daily.    . potassium chloride SA (K-DUR,KLOR-CON) 20 MEQ tablet Take 20 mEq by mouth 4 (four) times daily.      No current facility-administered medications for this visit.    Review of Systems Review of Systems  Constitutional: Negative.   Respiratory: Negative.   Cardiovascular: Negative.     Blood pressure 132/88, pulse 78, resp. rate 14, height 5\' 7"  (1.702 m), weight 202 lb (91.627 kg).  Physical Exam Physical Exam  Constitutional: She is oriented to person, place, and time. She appears well-developed and well-nourished.  Eyes: Conjunctivae are normal. No scleral icterus.  Neck: Neck supple.  Cardiovascular: Normal rate, regular rhythm and normal heart sounds.   Pulmonary/Chest: Effort normal and breath sounds normal. Right breast exhibits no inverted nipple, no mass, no nipple discharge, no skin change and no tenderness. Left breast exhibits no inverted nipple, no mass, no nipple discharge, no skin change and no tenderness.  Lymphadenopathy:    She has no cervical adenopathy.    She has no axillary adenopathy.  Neurological: She is alert and oriented to person, place, and time.  Skin: Skin is warm and dry.  Psychiatric: She has a normal mood and affect.  Data Reviewed Mammogram stable  Assessment    Fibrocystic disease by prior biopsy and family history of breast cancer.     Plan    Follow up with office visit in one year. Will not do mammogram unless indicated, given many yrs of stability and her age. Pt agreeable with this plan.     PCP: Huntley Estelle G 09/19/2014, 1:49 PM

## 2015-09-17 ENCOUNTER — Encounter: Payer: Self-pay | Admitting: *Deleted

## 2015-09-23 ENCOUNTER — Encounter: Payer: Self-pay | Admitting: General Surgery

## 2015-09-23 ENCOUNTER — Ambulatory Visit (INDEPENDENT_AMBULATORY_CARE_PROVIDER_SITE_OTHER): Payer: Medicare Other | Admitting: General Surgery

## 2015-09-23 VITALS — BP 176/92 | HR 86 | Resp 20 | Ht 66.0 in | Wt 193.0 lb

## 2015-09-23 DIAGNOSIS — N6019 Diffuse cystic mastopathy of unspecified breast: Secondary | ICD-10-CM

## 2015-09-23 DIAGNOSIS — Z803 Family history of malignant neoplasm of breast: Secondary | ICD-10-CM

## 2015-09-23 NOTE — Progress Notes (Signed)
Patient ID: Vanessa Logan, female   DOB: 08-01-1926, 82 y.o.   MRN: [id number]  Chief Complaint  Patient presents with  . Follow-up    breast check    HPI Vanessa SeraRuby O Bard is a 82 y.o. female here today for her one year breast check.Patient states she is doing well. No new health issues. I have reviewed the history of present illness with the patient.  HPI  Past Medical History:  Diagnosis Date  . Atrial fibrillation (HCC)   . Breast screening, unspecified 06/18/11  . Diffuse cystic mastopathy   . Family history of malignant neoplasm of breast 2013  . Obesity, unspecified 2013  . Screening for obesity 2013  . Special screening for malignant neoplasms, colon 06/21/2011  . Unspecified essential hypertension     Past Surgical History:  Procedure Laterality Date  . APPENDECTOMY  1950  . BREAST BIOPSY Bilateral 1992   neg  . BREAST BIOPSY Right 1996   neg  . BREAST BIOPSY Right 2005   neg  . COLONOSCOPY  2007  . COLONOSCOPY  2013   Dr Ricki RodriguezSkulski  . COLONOSCOPY W/ POLYPECTOMY  2004  . VARICOSE VEIN SURGERY  1983    Family History  Problem Relation Age of Onset  . Breast cancer Sister 4860  . Pancreatic cancer Sister   . Colon cancer Daughter   . Breast cancer Sister 1260    Social History Social History  Substance Use Topics  . Smoking status: Never Smoker  . Smokeless tobacco: Never Used  . Alcohol use No    Allergies  Allergen Reactions  . Neosporin [Neomycin-Bacitracin Zn-Polymyx] Itching and Swelling  . Keflex [Cephalexin] Rash    Current Outpatient Prescriptions  Medication Sig Dispense Refill  . hydrochlorothiazide (HYDRODIURIL) 50 MG tablet Take 50 mg by mouth daily.    Marland Kitchen. losartan (COZAAR) 50 MG tablet Take 1 tablet by mouth daily.    . metoprolol succinate (TOPROL-XL) 25 MG 24 hr tablet Take 12.5 mg by mouth daily.    Marland Kitchen. oxybutynin (DITROPAN-XL) 10 MG 24 hr tablet Take 10 mg by mouth 2 (two) times daily.    . potassium chloride SA (K-DUR,KLOR-CON) 20 MEQ tablet  Take 20 mEq by mouth 4 (four) times daily.     . dabigatran (PRADAXA) 150 MG CAPS capsule Take 1 capsule by mouth 2 (two) times daily.     No current facility-administered medications for this visit.     Review of Systems Review of Systems  Blood pressure (!) 176/92, pulse 86, resp. rate 20, height 5\' 6"  (1.676 m), weight 193 lb (87.5 kg).  Physical Exam Physical Exam  Constitutional: She is oriented to person, place, and time. She appears well-developed and well-nourished.  Eyes: Conjunctivae are normal. No scleral icterus.  Neck: Neck supple.  Cardiovascular: Normal rate, regular rhythm and normal heart sounds.   Pulmonary/Chest: Effort normal and breath sounds normal. Right breast exhibits no inverted nipple, no mass, no nipple discharge, no skin change and no tenderness. Left breast exhibits no inverted nipple, no mass, no nipple discharge, no skin change and no tenderness.  Abdominal: Soft. Normal appearance and bowel sounds are normal. There is no hepatomegaly. There is no tenderness.  Lymphadenopathy:    She has no cervical adenopathy.    She has no axillary adenopathy.  Neurological: She is alert and oriented to person, place, and time.  Skin: Skin is warm and dry.    Data Reviewed Prior notes reviewed   Assessment  Fibrocystic disease by prior biopsy and family history of breast cancer.     Plan    Patient to return in one year. Mammogram to be based on symptoms and findings on exam    This information has been scribed by Ples Specter CMA.   Catalyna Reilly G 09/23/2015, 12:13 PM

## 2015-09-23 NOTE — Patient Instructions (Signed)
Return in one year breast check  

## 2016-02-24 ENCOUNTER — Ambulatory Visit
Admission: RE | Admit: 2016-02-24 | Discharge: 2016-02-24 | Disposition: A | Discharge status: Home / Self Care | Payer: Medicare Other | Source: Ambulatory Visit | Attending: Internal Medicine | Admitting: Internal Medicine

## 2016-02-24 ENCOUNTER — Other Ambulatory Visit: Payer: Self-pay | Admitting: Internal Medicine

## 2016-02-24 DIAGNOSIS — M79604 Pain in right leg: Secondary | ICD-10-CM | POA: Insufficient documentation

## 2016-02-24 DIAGNOSIS — M7989 Other specified soft tissue disorders: Secondary | ICD-10-CM | POA: Diagnosis not present

## 2016-06-28 ENCOUNTER — Emergency Department: Payer: Medicare Other

## 2016-06-28 ENCOUNTER — Emergency Department
Admission: EM | Admit: 2016-06-28 | Discharge: 2016-06-28 | Disposition: A | Discharge status: Home / Self Care | Payer: Medicare Other | Attending: Emergency Medicine | Admitting: Emergency Medicine

## 2016-06-28 DIAGNOSIS — Z79899 Other long term (current) drug therapy: Secondary | ICD-10-CM | POA: Diagnosis not present

## 2016-06-28 DIAGNOSIS — R42 Dizziness and giddiness: Secondary | ICD-10-CM | POA: Insufficient documentation

## 2016-06-28 DIAGNOSIS — J029 Acute pharyngitis, unspecified: Secondary | ICD-10-CM | POA: Diagnosis present

## 2016-06-28 DIAGNOSIS — Z7901 Long term (current) use of anticoagulants: Secondary | ICD-10-CM | POA: Diagnosis not present

## 2016-06-28 DIAGNOSIS — I10 Essential (primary) hypertension: Secondary | ICD-10-CM | POA: Diagnosis not present

## 2016-06-28 LAB — COMPREHENSIVE METABOLIC PANEL
ALBUMIN: 3.5 g/dL (ref 3.5–5.0)
ALT: 15 U/L (ref 14–54)
ANION GAP: 7 (ref 5–15)
AST: 19 U/L (ref 15–41)
Alkaline Phosphatase: 97 U/L (ref 38–126)
BUN: 26 mg/dL — ABNORMAL HIGH (ref 6–20)
CALCIUM: 9 mg/dL (ref 8.9–10.3)
CHLORIDE: 99 mmol/L — AB (ref 101–111)
CO2: 28 mmol/L (ref 22–32)
Creatinine, Ser: 0.89 mg/dL (ref 0.44–1.00)
GFR calc non Af Amer: 56 mL/min — ABNORMAL LOW (ref >60)
Glucose, Bld: 104 mg/dL — ABNORMAL HIGH (ref 65–99)
POTASSIUM: 3.7 mmol/L (ref 3.5–5.1)
SODIUM: 134 mmol/L — AB (ref 135–145)
Total Bilirubin: 1.2 mg/dL (ref 0.3–1.2)
Total Protein: 7.4 g/dL (ref 6.5–8.1)

## 2016-06-28 LAB — URINALYSIS, COMPLETE (UACMP) WITH MICROSCOPIC
BILIRUBIN URINE: NEGATIVE
Bacteria, UA: NONE SEEN
GLUCOSE, UA: NEGATIVE mg/dL
HGB URINE DIPSTICK: NEGATIVE
Ketones, ur: NEGATIVE mg/dL
LEUKOCYTES UA: NEGATIVE
NITRITE: NEGATIVE
PROTEIN: NEGATIVE mg/dL
RBC / HPF: NONE SEEN RBC/hpf (ref 0–5)
Specific Gravity, Urine: 1.003 — ABNORMAL LOW (ref 1.005–1.030)
pH: 5 (ref 5.0–8.0)

## 2016-06-28 LAB — CBC
HCT: 37.6 % (ref 35.0–47.0)
Hemoglobin: 12.7 g/dL (ref 12.0–16.0)
MCH: 27.5 pg (ref 26.0–34.0)
MCHC: 33.6 g/dL (ref 32.0–36.0)
MCV: 81.6 fL (ref 80.0–100.0)
PLATELETS: 188 10*3/uL (ref 150–440)
RBC: 4.61 MIL/uL (ref 3.80–5.20)
RDW: 13.5 % (ref 11.5–14.5)
WBC: 6.1 10*3/uL (ref 3.6–11.0)

## 2016-06-28 LAB — TROPONIN I: Troponin I: <0.03 ng/mL (ref <0.03)

## 2016-06-28 LAB — POCT RAPID STREP A: STREPTOCOCCUS, GROUP A SCREEN (DIRECT): NEGATIVE

## 2016-06-28 NOTE — ED Provider Notes (Signed)
[facility] Emergency Department Provider Note   Time seen: 2:30 AM  I have reviewed the triage vital signs and the nursing notes.   HISTORY  Chief Complaint Sore Throat    HPI Vanessa Logan is a 83 y.o. female with below list of chronic medical conditions presents to the emergency department with sore throat and dry mouth times one week after completing course of Cipro for urinary tract infection. Patient states that she woke up this morning with "burning in her throat". Patient states that when she awoke "I felt like I was drunk". Patient states this resolved "quickly". Patient denies any headache at the time no weakness numbness. Patient denies any visual changes. Patient states that his throat discomfort has resolved. Patient states her current pain score 0 out of 10. Patient denies any fever.   Past Medical History:  Diagnosis Date  . Atrial fibrillation (HCC)   . Breast screening, unspecified 06/18/11  . Diffuse cystic mastopathy   . Family history of malignant neoplasm of breast 2013  . Obesity, unspecified 2013  . Screening for obesity 2013  . Special screening for malignant neoplasms, colon 06/21/2011  . Unspecified essential hypertension     Patient Active Problem List   Diagnosis Date Noted  . Fibrocystic breast disease 06/29/2012  . Family history of malignant neoplasm of breast     Past Surgical History:  Procedure Laterality Date  . APPENDECTOMY  1950  . BREAST BIOPSY Bilateral 1992   neg  . BREAST BIOPSY Right 1996   neg  . BREAST BIOPSY Right 2005   neg  . COLONOSCOPY  2007  . COLONOSCOPY  2013   Dr Ricki Rodriguez  . COLONOSCOPY W/ POLYPECTOMY  2004  . VARICOSE VEIN SURGERY  1983    Prior to Admission medications   Medication Sig Start Date End Date Taking? Authorizing Provider  dabigatran (PRADAXA) 150 MG CAPS capsule Take 1 capsule by mouth 2 (two) times daily. 04/04/14 04/04/15  [provider]  hydrochlorothiazide  (HYDRODIURIL) 50 MG tablet Take 50 mg by mouth daily.    [provider]  losartan (COZAAR) 50 MG tablet Take 1 tablet by mouth daily. 08/26/14   [provider]  metoprolol succinate (TOPROL-XL) 25 MG 24 hr tablet Take 12.5 mg by mouth daily.    [provider]  oxybutynin (DITROPAN-XL) 10 MG 24 hr tablet Take 10 mg by mouth 2 (two) times daily. 08/26/14   [provider]  potassium chloride SA (K-DUR,KLOR-CON) 20 MEQ tablet Take 20 mEq by mouth 4 (four) times daily.  03/05/14   [provider]    Allergies Neosporin [neomycin-bacitracin zn-polymyx] and Keflex [cephalexin]  Family History  Problem Relation Age of Onset  . Breast cancer Sister 32  . Pancreatic cancer Sister   . Colon cancer Daughter   . Breast cancer Sister 31    Social History Social History  Substance Use Topics  . Smoking status: Never Smoker  . Smokeless tobacco: Never Used  . Alcohol use No    Review of Systems Constitutional: No fever/chills Eyes: No visual changes. [id number] for sore throat. Cardiovascular: Denies chest pain. Respiratory: Denies shortness of breath. Gastrointestinal: No abdominal pain.  No nausea, no vomiting.  No diarrhea.  No constipation. Genitourinary: Negative for dysuria. Musculoskeletal: Negative for neck pain.  Negative for back pain. Integumentary: Negative for rash. Neurological: Negative for headaches, focal weakness or numbness.  ____________________________________________   PHYSICAL EXAM:  VITAL SIGNS: ED Triage Vitals  Enc Vitals Group     BP 06/28/16 0142 (!) 143/76     Pulse Rate 06/28/16 0142 72     Resp 06/28/16 0142 18     Temp 06/28/16 0142 98.1 F (36.7 C)     Temp Source 06/28/16 0142 Oral     SpO2 06/28/16 0142 98 %     Weight 06/28/16 0143 85.3 kg (188 lb)     Height 06/28/16 0143 1.727 m (5\' 8" )     Head Circumference --      Peak Flow --      Pain Score 06/28/16 0142 0     Pain Loc --      Pain Edu?  --      Excl. in GC? --     Constitutional: Alert and oriented. Well appearing and in no acute distress. Eyes: Conjunctivae are normal.  Head: Atraumatic. Nose: No congestion/rhinnorhea. Mouth/Throat: Mucous membranes are moist.  Oropharynx non-erythematous. Scant exudate noted right posterior oropharynx Neck: No stridor.  Cardiovascular: Normal rate, regular rhythm. Good peripheral circulation. Grossly normal heart sounds. Respiratory: Normal respiratory effort.  No retractions. Lungs CTAB. Gastrointestinal: Soft and nontender. No distention.  Musculoskeletal: No lower extremity tenderness nor edema. No gross deformities of extremities. Neurologic:  Normal speech and language. No gross focal neurologic deficits are appreciated.  Skin:  Skin is warm, dry and intact. No rash noted. Psychiatric: Mood and affect are normal. Speech and behavior are normal.  ____________________________________________   LABS (all labs ordered are listed, but only abnormal results are displayed)  Labs Reviewed  COMPREHENSIVE METABOLIC PANEL - Abnormal; Notable for the following:       Result Value   Sodium 134 (*)    Chloride 99 (*)    Glucose, Bld 104 (*)    BUN 26 (*)    GFR calc non Af Amer 56 (*)    All other components within normal limits  URINALYSIS, COMPLETE (UACMP) WITH MICROSCOPIC - Abnormal; Notable for the following:    Color, Urine STRAW (*)    APPearance CLEAR (*)    Specific Gravity, Urine 1.003 (*)    Squamous Epithelial / LPF 0-5 (*)    All other components within normal limits  CBC  TROPONIN I  POCT RAPID STREP A   ____________________________________________  EKG  ED ECG REPORT I, [place] Boss Danielsen, the attending physician, personally viewed and interpreted this ECG.   Date: 06/28/2016  EKG Time: 1:44 AM  Rate: 73  Rhythm: Normal sinus rhythm  Axis: Normal  Intervals: Normal  ST&T Change: None  ____________________________________________  RADIOLOGY I, [place]  N Gaylan Fauver, personally viewed and evaluated these images (plain radiographs) as part of my medical decision making, as well as reviewing the written report by the radiologist.  Ct Head Wo Contrast  Result Date: 06/28/2016 CLINICAL DATA:  Dizziness EXAM: CT HEAD WITHOUT CONTRAST TECHNIQUE: Contiguous axial images were obtained from the base of the skull through the vertex without intravenous contrast. COMPARISON:  Head CT 03/01/2013 FINDINGS: Brain: No mass lesion, intraparenchymal hemorrhage or extra-axial collection. No evidence of acute cortical infarct. Brain parenchyma and CSF-containing spaces are normal for age. Vascular: No hyperdense vessel or unexpected calcification. Skull: Normal visualized skull base, calvarium and extracranial soft tissues. Sinuses/Orbits: No sinus fluid levels or advanced mucosal thickening. No mastoid effusion. Normal orbits. IMPRESSION: Normal CT of the brain for age. Electronically Signed   By: Deatra Robinson M.D.   On: 06/28/2016 02:43      Procedures  ____________________________________________   INITIAL IMPRESSION / ASSESSMENT AND PLAN / ED COURSE  Pertinent labs & imaging results that were available during my care of the patient were reviewed by me and considered in my medical decision making (see chart for details).  83 year old female presenting with sore throat 1 week after completing course of ciprofloxacin for urinary tract infection. Patient also admits to feeling disoriented on awakening this morning. Laboratory data unremarkable including urinalysis. CT scan revealed no acute abnormality. Rapid strep performed of the throat negative culture pending. Patient with no complaints at present. Patient advised to follow-up with Dr. Judithann SheenSparks primary care provider for further outpatient evaluation      ____________________________________________  FINAL CLINICAL IMPRESSION(S) / ED DIAGNOSES  Final diagnoses:  Sore throat     MEDICATIONS GIVEN DURING  THIS VISIT:  Medications - No data to display   NEW OUTPATIENT MEDICATIONS STARTED DURING THIS VISIT:  New Prescriptions   No medications on file    Modified Medications   No medications on file    Discontinued Medications   No medications on file     Note:  This document was prepared using Dragon voice recognition software and may include unintentional dictation errors.    Darci CurrentBrown, [place] N, MD 06/28/16 [id number]

## 2016-06-28 NOTE — ED Triage Notes (Signed)
Patient with sore throat and dry mouth. Symptoms started a week ago. Has finished a course of Cipro. States she has had throat burning and tonight it woke her up. When she woke up she "felt drunk as a skunk". States that feeling didn't last very long and feels completely normal now. Takes potassium every day.

## 2016-09-09 ENCOUNTER — Ambulatory Visit (INDEPENDENT_AMBULATORY_CARE_PROVIDER_SITE_OTHER): Payer: Medicare Other | Admitting: General Surgery

## 2016-09-09 ENCOUNTER — Encounter: Payer: Self-pay | Admitting: General Surgery

## 2016-09-09 VITALS — BP 110/72 | HR 76 | Resp 14 | Ht 68.0 in | Wt 183.0 lb

## 2016-09-09 DIAGNOSIS — Z803 Family history of malignant neoplasm of breast: Secondary | ICD-10-CM | POA: Diagnosis not present

## 2016-09-09 NOTE — Patient Instructions (Addendum)
Follow-up with primary care provider for yearly breast checks. Call with any questions or concerns.

## 2016-09-09 NOTE — Progress Notes (Signed)
Patient ID: Vanessa Logan, female   DOB: 1926-12-31, 83 y.o.   MRN: [id number]  Chief Complaint  Patient presents with  . Follow-up    HPI Vanessa Logan is a 83 y.o. female.  Her for one year follow up breast evalatuion. No breast or bowel complaints.  She is here with her step daughter, Agustin Cree.  HPI  Past Medical History:  Diagnosis Date  . Atrial fibrillation (HCC)   . Breast screening, unspecified 06/18/11  . Diffuse cystic mastopathy   . Family history of malignant neoplasm of breast 2013  . Obesity, unspecified 2013  . Screening for obesity 2013  . Special screening for malignant neoplasms, colon 06/21/2011  . Unspecified essential hypertension     Past Surgical History:  Procedure Laterality Date  . APPENDECTOMY  1950  . BREAST BIOPSY Bilateral 1992   neg  . BREAST BIOPSY Right 1996   neg  . BREAST BIOPSY Right 2005   neg  . COLONOSCOPY  2007  . COLONOSCOPY  2013   Dr Ricki Rodriguez  . COLONOSCOPY W/ POLYPECTOMY  2004  . VARICOSE VEIN SURGERY  1983    Family History  Problem Relation Age of Onset  . Breast cancer Sister 24  . Pancreatic cancer Sister   . Colon cancer Daughter   . Breast cancer Sister 77    Social History Social History  Substance Use Topics  . Smoking status: Never Smoker  . Smokeless tobacco: Never Used  . Alcohol use No    Allergies  Allergen Reactions  . Neosporin [Neomycin-Bacitracin Zn-Polymyx] Itching and Swelling  . Keflex [Cephalexin] Rash    Current Outpatient Prescriptions  Medication Sig Dispense Refill  . apixaban (ELIQUIS) 5 MG TABS tablet Take 5 mg by mouth 2 (two) times daily.    . hydrochlorothiazide (HYDRODIURIL) 50 MG tablet Take 50 mg by mouth daily.    Marland Kitchen losartan (COZAAR) 50 MG tablet Take 1 tablet by mouth daily.    . metoprolol succinate (TOPROL-XL) 25 MG 24 hr tablet Take 12.5 mg by mouth daily.    Marland Kitchen oxybutynin (DITROPAN-XL) 10 MG 24 hr tablet Take 10 mg by mouth 2 (two) times daily.    . potassium chloride SA  (K-DUR,KLOR-CON) 20 MEQ tablet Take 20 mEq by mouth 2 (two) times daily.      No current facility-administered medications for this visit.     Review of Systems Review of Systems  Constitutional: Negative.   Respiratory: Negative.   Cardiovascular: Negative.     Blood pressure 110/72, pulse 76, resp. rate 14, height 5\' 8"  (1.727 m), weight 183 lb (83 kg).  Physical Exam Physical Exam  Constitutional: She is oriented to person, place, and time. She appears well-developed and well-nourished.  Eyes: Conjunctivae are normal. No scleral icterus.  Neck: Neck supple.  Cardiovascular: Normal rate, regular rhythm and normal heart sounds.   Pulmonary/Chest: Effort normal and breath sounds normal. Right breast exhibits no inverted nipple, no mass, no nipple discharge, no skin change and no tenderness. Left breast exhibits no inverted nipple, no mass, no nipple discharge, no skin change and no tenderness. Breasts are symmetrical.  Abdominal: Soft. Bowel sounds are normal. There is no tenderness.  Lymphadenopathy:    She has no cervical adenopathy.    She has no axillary adenopathy.  Neurological: She is alert and oriented to person, place, and time.  Skin: Skin is warm and dry.    Data Reviewed Prior note reviewed   Assessment    Family  hx of breast cancer - stable exam today, no palpable masses or lymphadenopathy. Due to pt's age, mammograms are no longer indicated.     Plan    Follow-up with primary care provider for yearly breast checks       HPI, Physical Exam, Assessment and Plan have been scribed under the direction and in the presence of Kathreen CosierS. G. Abagayle Klutts, MD Dorathy DaftMarsha Hatch, RN I have completed the exam and reviewed the above documentation for accuracy and completeness.  I agree with the above.  [employer] has been used and any errors in dictation or transcription are unintentional.  Haskel Dewalt G. Evette CristalSankar, M.D., F.A.C.S.  Gerlene BurdockSANKAR,Cleatus Gabriel G 09/13/2016, 8:01 AM

## 2017-07-04 ENCOUNTER — Inpatient Hospital Stay
Admission: EM | Admit: 2017-07-04 | Discharge: 2017-07-06 | DRG: 641 | Disposition: A | Discharge status: Home / Self Care | Payer: Medicare Other | Attending: Internal Medicine | Admitting: Internal Medicine

## 2017-07-04 ENCOUNTER — Emergency Department: Payer: Medicare Other

## 2017-07-04 DIAGNOSIS — R42 Dizziness and giddiness: Secondary | ICD-10-CM | POA: Diagnosis present

## 2017-07-04 DIAGNOSIS — E860 Dehydration: Principal | ICD-10-CM | POA: Diagnosis present

## 2017-07-04 DIAGNOSIS — I480 Paroxysmal atrial fibrillation: Secondary | ICD-10-CM | POA: Diagnosis not present

## 2017-07-04 DIAGNOSIS — I10 Essential (primary) hypertension: Secondary | ICD-10-CM | POA: Diagnosis not present

## 2017-07-04 DIAGNOSIS — Z7901 Long term (current) use of anticoagulants: Secondary | ICD-10-CM | POA: Diagnosis not present

## 2017-07-04 DIAGNOSIS — Z881 Allergy status to other antibiotic agents status: Secondary | ICD-10-CM | POA: Diagnosis not present

## 2017-07-04 DIAGNOSIS — Z800 Family history of malignant neoplasm of digestive organs: Secondary | ICD-10-CM

## 2017-07-04 DIAGNOSIS — N3000 Acute cystitis without hematuria: Secondary | ICD-10-CM | POA: Diagnosis not present

## 2017-07-04 DIAGNOSIS — Z803 Family history of malignant neoplasm of breast: Secondary | ICD-10-CM | POA: Diagnosis not present

## 2017-07-04 DIAGNOSIS — Z9049 Acquired absence of other specified parts of digestive tract: Secondary | ICD-10-CM

## 2017-07-04 DIAGNOSIS — Z79899 Other long term (current) drug therapy: Secondary | ICD-10-CM

## 2017-07-04 DIAGNOSIS — Z883 Allergy status to other anti-infective agents status: Secondary | ICD-10-CM

## 2017-07-04 DIAGNOSIS — N390 Urinary tract infection, site not specified: Secondary | ICD-10-CM | POA: Diagnosis present

## 2017-07-04 LAB — CBC
HEMATOCRIT: 37 % (ref 35.0–47.0)
Hemoglobin: 12.8 g/dL (ref 12.0–16.0)
MCH: 28 pg (ref 26.0–34.0)
MCHC: 34.7 g/dL (ref 32.0–36.0)
MCV: 80.7 fL (ref 80.0–100.0)
Platelets: 193 10*3/uL (ref 150–440)
RBC: 4.59 MIL/uL (ref 3.80–5.20)
RDW: 14.2 % (ref 11.5–14.5)
WBC: 5 10*3/uL (ref 3.6–11.0)

## 2017-07-04 LAB — URINALYSIS, COMPLETE (UACMP) WITH MICROSCOPIC
BILIRUBIN URINE: NEGATIVE
Glucose, UA: NEGATIVE mg/dL
Hgb urine dipstick: NEGATIVE
KETONES UR: NEGATIVE mg/dL
LEUKOCYTES UA: NEGATIVE
Nitrite: POSITIVE — AB
PROTEIN: NEGATIVE mg/dL
SPECIFIC GRAVITY, URINE: 1.008 (ref 1.005–1.030)
SQUAMOUS EPITHELIAL / LPF: NONE SEEN (ref 0–5)
pH: 5 (ref 5.0–8.0)

## 2017-07-04 LAB — URINE DRUG SCREEN, QUALITATIVE (ARMC ONLY)
Amphetamines, Ur Screen: NOT DETECTED
BARBITURATES, UR SCREEN: NOT DETECTED
BENZODIAZEPINE, UR SCRN: NOT DETECTED
Cannabinoid 50 Ng, Ur ~~LOC~~: NOT DETECTED
Cocaine Metabolite,Ur ~~LOC~~: NOT DETECTED
MDMA (Ecstasy)Ur Screen: NOT DETECTED
Methadone Scn, Ur: NOT DETECTED
Opiate, Ur Screen: NOT DETECTED
Phencyclidine (PCP) Ur S: NOT DETECTED
TRICYCLIC, UR SCREEN: NOT DETECTED

## 2017-07-04 LAB — TROPONIN I

## 2017-07-04 LAB — BASIC METABOLIC PANEL
Anion gap: 7 (ref 5–15)
BUN: 25 mg/dL — ABNORMAL HIGH (ref 6–20)
CHLORIDE: 103 mmol/L (ref 101–111)
CO2: 25 mmol/L (ref 22–32)
Calcium: 8.9 mg/dL (ref 8.9–10.3)
Creatinine, Ser: 0.81 mg/dL (ref 0.44–1.00)
GFR calc Af Amer: >60 mL/min (ref >60)
GFR calc non Af Amer: >60 mL/min (ref >60)
GLUCOSE: 112 mg/dL — AB (ref 65–99)
POTASSIUM: 3.7 mmol/L (ref 3.5–5.1)
SODIUM: 135 mmol/L (ref 135–145)

## 2017-07-04 MED ORDER — HYDROCODONE-ACETAMINOPHEN 5-325 MG PO TABS: 1.0000 | ORAL_TABLET | ORAL | Status: DC | PRN

## 2017-07-04 MED ORDER — BISACODYL 5 MG PO TBEC: 5.0000 mg | DELAYED_RELEASE_TABLET | Freq: Every day | ORAL | Status: DC | PRN

## 2017-07-04 MED ORDER — ACETAMINOPHEN 325 MG PO TABS: 650.0000 mg | ORAL_TABLET | Freq: Four times a day (QID) | ORAL | Status: DC | PRN

## 2017-07-04 MED ORDER — SODIUM CHLORIDE 0.9 % IV SOLN
Freq: Once | INTRAVENOUS | Status: AC
  Administered 2017-07-05: 01:00:00 via INTRAVENOUS

## 2017-07-04 MED ORDER — DOCUSATE SODIUM 100 MG PO CAPS
100.0000 mg | ORAL_CAPSULE | Freq: Two times a day (BID) | ORAL | Status: DC
  Administered 2017-07-06: 10:00:00 100 mg via ORAL
  Filled 2017-07-04 (×3): qty 1

## 2017-07-04 MED ORDER — SODIUM CHLORIDE 0.9 % IV SOLN
1.0000 g | Freq: Once | INTRAVENOUS | Status: AC
  Administered 2017-07-04: 1 g via INTRAVENOUS
  Filled 2017-07-04: qty 10

## 2017-07-04 MED ORDER — MECLIZINE HCL 25 MG PO TABS
12.5000 mg | ORAL_TABLET | Freq: Once | ORAL | Status: AC
  Administered 2017-07-04: 12.5 mg via ORAL
  Filled 2017-07-04: qty 1

## 2017-07-04 MED ORDER — SODIUM CHLORIDE 0.9 % IV BOLUS
500.0000 mL | Freq: Once | INTRAVENOUS | Status: AC
  Administered 2017-07-04: 500 mL via INTRAVENOUS

## 2017-07-04 MED ORDER — METOPROLOL SUCCINATE ER 25 MG PO TB24
12.5000 mg | ORAL_TABLET | Freq: Every day | ORAL | Status: DC
  Administered 2017-07-05: 12.5 mg via ORAL
  Filled 2017-07-04 (×3): qty 1

## 2017-07-04 MED ORDER — HYDROCHLOROTHIAZIDE 25 MG PO TABS: 50.0000 mg | ORAL_TABLET | Freq: Every day | ORAL | Status: DC

## 2017-07-04 MED ORDER — TRAZODONE HCL 50 MG PO TABS
25.0000 mg | ORAL_TABLET | Freq: Every evening | ORAL | Status: DC | PRN
  Administered 2017-07-05: 25 mg via ORAL
  Filled 2017-07-04: qty 1

## 2017-07-04 MED ORDER — GADOBENATE DIMEGLUMINE 529 MG/ML IV SOLN: 15.0000 mL | Freq: Once | INTRAVENOUS | Status: DC | PRN

## 2017-07-04 MED ORDER — ACETAMINOPHEN 650 MG RE SUPP: 650.0000 mg | Freq: Four times a day (QID) | RECTAL | Status: DC | PRN

## 2017-07-04 MED ORDER — POTASSIUM CHLORIDE CRYS ER 20 MEQ PO TBCR
20.0000 meq | EXTENDED_RELEASE_TABLET | Freq: Two times a day (BID) | ORAL | Status: DC
  Administered 2017-07-05 – 2017-07-06 (×3): 20 meq via ORAL
  Filled 2017-07-04 (×3): qty 1

## 2017-07-04 MED ORDER — LOSARTAN POTASSIUM 50 MG PO TABS
50.0000 mg | ORAL_TABLET | Freq: Every day | ORAL | Status: DC
  Administered 2017-07-05 – 2017-07-06 (×2): 50 mg via ORAL
  Filled 2017-07-04 (×2): qty 1

## 2017-07-04 MED ORDER — APIXABAN 5 MG PO TABS
5.0000 mg | ORAL_TABLET | Freq: Two times a day (BID) | ORAL | Status: DC
  Administered 2017-07-05 – 2017-07-06 (×3): 5 mg via ORAL
  Filled 2017-07-04 (×3): qty 1

## 2017-07-04 MED ORDER — SODIUM CHLORIDE 0.9 % IV SOLN
1.0000 g | INTRAVENOUS | Status: DC
  Filled 2017-07-04: qty 10

## 2017-07-04 MED ORDER — ONDANSETRON HCL 4 MG PO TABS: 4.0000 mg | ORAL_TABLET | Freq: Four times a day (QID) | ORAL | Status: DC | PRN

## 2017-07-04 MED ORDER — ONDANSETRON HCL 4 MG/2ML IJ SOLN: 4.0000 mg | Freq: Four times a day (QID) | INTRAMUSCULAR | Status: DC | PRN

## 2017-07-04 NOTE — H&P (Signed)
[facility] - [place] at [facility]   PATIENT NAME: Vanessa Logan    MR#:  [id number]  DATE OF BIRTH:  26-Jun-1926  DATE OF ADMISSION:  07/04/2017  PRIMARY CARE PHYSICIAN: Marguarite Arbour, MD   REQUESTING/REFERRING PHYSICIAN:   CHIEF COMPLAINT:   Chief Complaint  Patient presents with  . Dizziness    HISTORY OF PRESENT ILLNESS: Vanessa Logan  is a 84 y.o. female with a known history of hypertension, urinary incontinence, atrial fibrillation, on Eliquis. Patient was brought to emergency room for dizziness, generalized weakness and nausea, started acutely around noon today.  Her symptoms are worse when she is up, ambulating.  She describes the dizziness as severe weakness, she feels drained.  Patient states that she had similar symptoms few months ago when she was taking oxybutynin for urinary incontinence.  She denies any headache, focal weakness, numbness or tingling.  No chest pain, palpitations, no fever/chills. She started this new medication, Myrbetriq just 2 days ago, for urinary incontinence.  Neither oxybutynin or Myrbetriq helped with her urinary incontinence. Blood test done in emergency room, including CBC and CMP are essentially unremarkable.  UA is positive for UTI.  EKG shows normal sinus rhythm with rate of 90; no acute ST-T changes.  Troponin level is less than 0.03. Brain CT and MRI, reviewed by myself, are negative for any acute intracranial abnormality.  PAST MEDICAL HISTORY:   Past Medical History:  Diagnosis Date  . Atrial fibrillation (HCC)   . Breast screening, unspecified 06/18/11  . Diffuse cystic mastopathy   . Family history of malignant neoplasm of breast 2013  . Obesity, unspecified 2013  . Screening for obesity 2013  . Special screening for malignant neoplasms, colon 06/21/2011  . Unspecified essential hypertension     PAST SURGICAL HISTORY:  Past Surgical History:  Procedure Laterality Date  . APPENDECTOMY  1950  . BREAST BIOPSY  Bilateral 1992   neg  . BREAST BIOPSY Right 1996   neg  . BREAST BIOPSY Right 2005   neg  . COLONOSCOPY  2007  . COLONOSCOPY  2013   Dr Ricki Rodriguez  . COLONOSCOPY W/ POLYPECTOMY  2004  . VARICOSE VEIN SURGERY  1983    SOCIAL HISTORY:  Social History   Tobacco Use  . Smoking status: Never Smoker  . Smokeless tobacco: Never Used  Substance Use Topics  . Alcohol use: No    Alcohol/week: 0.0 oz    FAMILY HISTORY:  Family History  Problem Relation Age of Onset  . Breast cancer Sister 73  . Pancreatic cancer Sister   . Colon cancer Daughter   . Breast cancer Sister 37    DRUG ALLERGIES:  Allergies  Allergen Reactions  . Neosporin [Neomycin-Bacitracin Zn-Polymyx] Itching and Swelling  . Keflex [Cephalexin] Rash    REVIEW OF SYSTEMS:   CONSTITUTIONAL: No fever, the patient complains of fatigue and generalized weakness.  EYES: No blurred or double vision.  EARS, NOSE, AND THROAT: No tinnitus or ear pain.  RESPIRATORY: No cough, shortness of breath, wheezing or hemoptysis.  CARDIOVASCULAR: No chest pain, orthopnea, edema.  GASTROINTESTINAL: Positive for nausea and vomiting once.  No diarrhea or abdominal pain.  GENITOURINARY: No dysuria, hematuria.  ENDOCRINE: No polyuria, nocturia,  HEMATOLOGY: No bleeding SKIN: No rash or lesion. MUSCULOSKELETAL: No joint pain at this time.   NEUROLOGIC: No tingling, numbness, weakness.  PSYCHIATRY: No anxiety or depression.   MEDICATIONS AT HOME:  Prior to Admission medications   Medication Sig Start  Date End Date Taking? Authorizing Provider  apixaban (ELIQUIS) 5 MG TABS tablet Take 5 mg by mouth 2 (two) times daily.    [provider]  hydrochlorothiazide (HYDRODIURIL) 50 MG tablet Take 50 mg by mouth daily.    [provider]  losartan (COZAAR) 50 MG tablet Take 1 tablet by mouth daily. 08/26/14   [provider]  metoprolol succinate (TOPROL-XL) 25 MG 24 hr tablet Take 12.5 mg by mouth daily.     [provider]  oxybutynin (DITROPAN-XL) 10 MG 24 hr tablet Take 10 mg by mouth 2 (two) times daily. 08/26/14   [provider]  potassium chloride SA (K-DUR,KLOR-CON) 20 MEQ tablet Take 20 mEq by mouth 2 (two) times daily.  03/05/14   [provider]      PHYSICAL EXAMINATION:   VITAL SIGNS: Blood pressure (!) 151/75, pulse 73, temperature 98.2 F (36.8 C), temperature source Oral, resp. rate 18, height 5\' 8"  (1.727 m), weight 85.3 kg (188 lb), SpO2 96 %.  GENERAL:  84 y.o.-year-old patient lying in the bed with no acute distress.  She looks tired but nontoxic.  She is asymptomatic while in bed at rest. EYES: Pupils equal, round, reactive to light and accommodation. No scleral icterus. Extraocular muscles intact.  HEENT: Head atraumatic, normocephalic. Oropharynx and nasopharynx clear.  NECK:  Supple, no jugular venous distention. No thyroid enlargement, no tenderness.  LUNGS: Reduced breath sounds bilaterally, no wheezing, rales,rhonchi or crepitation. No use of accessory muscles of respiration.  CARDIOVASCULAR: S1, S2 normal. No S3/S4.  ABDOMEN: Soft, nontender, nondistended. Bowel sounds present. No organomegaly or mass.  EXTREMITIES: No pedal edema, cyanosis, or clubbing.  NEUROLOGIC: Cranial nerves II through XII are intact. Muscle strength 5/5 in all extremities. Sensation intact. Gait not checked, due to patient's generalized weakness.  PSYCHIATRIC: The patient is alert and oriented x 3.  SKIN: No obvious rash, lesion, or ulcer.   LABORATORY PANEL:   CBC Recent Labs  Lab 07/04/17 1708  WBC 5.0  HGB 12.8  HCT 37.0  PLT 193  MCV 80.7  MCH 28.0  MCHC 34.7  RDW 14.2   ------------------------------------------------------------------------------------------------------------------  Chemistries  Recent Labs  Lab 07/04/17 1708  NA 135  K 3.7  CL 103  CO2 25  GLUCOSE 112*  BUN 25*  CREATININE 0.81  CALCIUM 8.9    ------------------------------------------------------------------------------------------------------------------ estimated creatinine clearance is 53.9 mL/min (by C-G formula based on SCr of 0.81 mg/dL). ------------------------------------------------------------------------------------------------------------------ No results for input(s): TSH, T4TOTAL, T3FREE, THYROIDAB in the last 72 hours.  Invalid input(s): FREET3   Coagulation profile No results for input(s): INR, PROTIME in the last 168 hours. ------------------------------------------------------------------------------------------------------------------- No results for input(s): DDIMER in the last 72 hours. -------------------------------------------------------------------------------------------------------------------  Cardiac Enzymes Recent Labs  Lab 07/04/17 1930  TROPONINI <0.03   ------------------------------------------------------------------------------------------------------------------ Invalid input(s): POCBNP  ---------------------------------------------------------------------------------------------------------------  Urinalysis    Component Value Date/Time   COLORURINE YELLOW (A) 07/04/2017 1955   APPEARANCEUR CLEAR (A) 07/04/2017 1955   LABSPEC 1.008 07/04/2017 1955   PHURINE 5.0 07/04/2017 1955   GLUCOSEU NEGATIVE 07/04/2017 1955   HGBUR NEGATIVE 07/04/2017 1955   BILIRUBINUR NEGATIVE 07/04/2017 1955   KETONESUR NEGATIVE 07/04/2017 1955   PROTEINUR NEGATIVE 07/04/2017 1955   NITRITE POSITIVE (A) 07/04/2017 1955   LEUKOCYTESUR NEGATIVE 07/04/2017 1955     RADIOLOGY: Ct Head Wo Contrast  Result Date: 07/04/2017 CLINICAL DATA:  Ataxia.  Altered level of consciousness. EXAM: CT HEAD WITHOUT CONTRAST TECHNIQUE: Contiguous axial images were obtained from the base of the  skull through the vertex without intravenous contrast. COMPARISON:  CT head 06/28/2016 FINDINGS: Brain: Mild atrophy.  Negative for hydrocephalus. Negative for acute infarct. Negative for hemorrhage or mass. No midline shift. Vascular: Negative for hyperdense vessel Skull: Negative Sinuses/Orbits: Paranasal sinuses clear.  Bilateral cataract removal Other: None IMPRESSION: No acute intracranial abnormality. Electronically Signed   By: Marlan Palauharles  Clark M.D.   On: 07/04/2017 19:47   Mr Brain Wo Contrast  Result Date: 07/04/2017 CLINICAL DATA:  Dizziness and vomiting after lunch today, recurrent dizziness. History of hypertension. EXAM: MRI HEAD WITHOUT CONTRAST TECHNIQUE: Multiplanar, multiecho pulse sequences of the brain and surrounding structures were obtained without intravenous contrast. COMPARISON:  CT head July 04, 2017 FINDINGS: INTRACRANIAL CONTENTS: No reduced diffusion to suggest acute ischemia. No susceptibility artifact to suggest hemorrhage. The ventricles and sulci are normal for patient's age. Pain supratentorial white matter FLAIR T2 hyperintensities compatible mild chronic small vessel ischemic changes, less expected for age. No suspicious parenchymal signal, masses, mass effect. No abnormal extra-axial fluid collections. No extra-axial masses. VASCULAR: Normal major intracranial vascular flow voids present at skull base. SKULL AND UPPER CERVICAL SPINE: No abnormal sellar expansion. No suspicious calvarial bone marrow signal. Craniocervical junction maintained. SINUSES/ORBITS: The mastoid air-cells and included paranasal sinuses are well-aerated.The included ocular globes and orbital contents are non-suspicious. Status post bilateral ocular lens implants. OTHER: None. IMPRESSION: 1. Normal noncontrast MRI of the head for age. Electronically Signed   By: Awilda Metroourtnay  Bloomer M.D.   On: 07/04/2017 22:17    EKG: Orders placed or performed during the hospital encounter of 07/04/17  . ED EKG  . ED EKG    IMPRESSION AND PLAN:   1.  Dizziness and generalized weakness, likely related to Myrbetriq and oxybutynin  use.  Will will avoid this class of medications and this patient.  Continue to monitor clinically closely.  Will have PT and OT evaluate the patient. 2.  Acute UTI, will start IV antibiotics, waiting for urine culture result. 3.  Urinary incontinence.  Will D/C hydrochlorothiazide.  Continue to use pads, as needed. 4.  Paroxysmal atrial fibrillation, currently rate controlled, in sinus rhythm.  Continue treatment with beta-blocker and Eliquis.  5.  Hypertension, stable, restart home medications.  All the records are reviewed and case discussed with ED provider. Management plans discussed with the patient, family and they are in agreement.  CODE STATUS:    TOTAL TIME TAKING CARE OF THIS PATIENT: 45 minutes.    Cammy CopaAngela Langston Summerfield M.D on 07/04/2017 at 10:49 PM  Between 7am to 6pm - Pager - [phone]  After 6pm go to [email or web address] - password [email or web address] [facility]  [place] [facility] [place] [facility]  Office  [phone]  CC: Primary care physician; Marguarite ArbourSparks, Jeffrey D, MD

## 2017-07-04 NOTE — ED Notes (Signed)
Report called to RN pt admitted to [id number]

## 2017-07-04 NOTE — ED Provider Notes (Signed)
[facility] Emergency Department Provider Note    First MD Initiated Contact with Patient 07/04/17 1850     (approximate)  I have reviewed the triage vital signs and the nursing notes.   HISTORY  Chief Complaint Dizziness    HPI Vanessa Logan is a 84 y.o. female the history of A. fib on Eliquis presents the ER with chief complaint of feeling dizzy shortly after lunch.  Said that she was feeling very dizzy with difficulty walking.  Felt that she was hungry as she had not eaten anything but she ate something and vomited it up.  Denies any numbness or tingling.  Anytime she moves her symptoms or worsen.  Does have some nausea.  Does not feel at her baseline.  Last time she was feeling completely normal was around noon.  Denies any headache.  No chest pain or shortness of breath.    Past Medical History:  Diagnosis Date  . Atrial fibrillation (HCC)   . Breast screening, unspecified 06/18/11  . Diffuse cystic mastopathy   . Family history of malignant neoplasm of breast 2013  . Obesity, unspecified 2013  . Screening for obesity 2013  . Special screening for malignant neoplasms, colon 06/21/2011  . Unspecified essential hypertension    Family History  Problem Relation Age of Onset  . Breast cancer Sister 16  . Pancreatic cancer Sister   . Colon cancer Daughter   . Breast cancer Sister 79   Past Surgical History:  Procedure Laterality Date  . APPENDECTOMY  1950  . BREAST BIOPSY Bilateral 1992   neg  . BREAST BIOPSY Right 1996   neg  . BREAST BIOPSY Right 2005   neg  . COLONOSCOPY  2007  . COLONOSCOPY  2013   Dr Ricki Rodriguez  . COLONOSCOPY W/ POLYPECTOMY  2004  . VARICOSE VEIN SURGERY  1983   Patient Active Problem List   Diagnosis Date Noted  . Fibrocystic breast disease 06/29/2012  . Family history of malignant neoplasm of breast       Prior to Admission medications   Medication Sig Start Date End Date Taking? Authorizing Provider  apixaban  (ELIQUIS) 5 MG TABS tablet Take 5 mg by mouth 2 (two) times daily.    [provider]  hydrochlorothiazide (HYDRODIURIL) 50 MG tablet Take 50 mg by mouth daily.    [provider]  losartan (COZAAR) 50 MG tablet Take 1 tablet by mouth daily. 08/26/14   [provider]  metoprolol succinate (TOPROL-XL) 25 MG 24 hr tablet Take 12.5 mg by mouth daily.    [provider]  oxybutynin (DITROPAN-XL) 10 MG 24 hr tablet Take 10 mg by mouth 2 (two) times daily. 08/26/14   [provider]  potassium chloride SA (K-DUR,KLOR-CON) 20 MEQ tablet Take 20 mEq by mouth 2 (two) times daily.  03/05/14   [provider]    Allergies Neosporin [neomycin-bacitracin zn-polymyx] and Keflex [cephalexin]    Social History Social History   Tobacco Use  . Smoking status: Never Smoker  . Smokeless tobacco: Never Used  Substance Use Topics  . Alcohol use: No    Alcohol/week: 0.0 oz  . Drug use: No    Review of Systems Patient denies headaches, rhinorrhea, blurry vision, numbness, shortness of breath, chest pain, edema, cough, abdominal pain, nausea, vomiting, diarrhea, dysuria, fevers, rashes or hallucinations unless otherwise stated above in HPI. ____________________________________________   PHYSICAL EXAM:  VITAL SIGNS: Vitals:   07/04/17 1701  BP: Marland Kitchen)  141/96  Pulse: 92  Resp: 18  Temp: 98.2 F (36.8 C)  SpO2: 94%    Constitutional: Alert and oriented.  Eyes: Conjunctivae are normal.  Head: Atraumatic. Nose: No congestion/rhinnorhea. Mouth/Throat: Mucous membranes are moist.   Neck: No stridor. Painless ROM.  Cardiovascular: Normal rate, regular rhythm. Grossly normal heart sounds.  Good peripheral circulation. Respiratory: Normal respiratory effort.  No retractions. Lungs CTAB. Gastrointestinal: Soft and nontender. No distention. No abdominal bruits. No CVA tenderness. Genitourinary:  Musculoskeletal: No lower extremity tenderness nor edema.   No joint effusions. Neurologic:  CN- intact.  No facial droop, Normal FNF.  Unable to follow gaze to right, no nystagmus noted, Normal heel to shin.  Sensation intact bilaterally. Normal speech and language.  Skin:  Skin is warm, dry and intact. No rash noted. Psychiatric: Mood and affect are normal. Speech and behavior are normal.  ____________________________________________   LABS (all labs ordered are listed, but only abnormal results are displayed)  Results for orders placed or performed during the hospital encounter of 07/04/17 (from the past 24 hour(s))  Basic metabolic panel     Status: Abnormal   Collection Time: 07/04/17  5:08 PM  Result Value Ref Range   Sodium 135 135 - 145 mmol/L   Potassium 3.7 3.5 - 5.1 mmol/L   Chloride 103 101 - 111 mmol/L   CO2 25 22 - 32 mmol/L   Glucose, Bld 112 (H) 65 - 99 mg/dL   BUN 25 (H) 6 - 20 mg/dL   Creatinine, Ser 4.130.81 0.44 - 1.00 mg/dL   Calcium 8.9 8.9 - 24.410.3 mg/dL   GFR calc non Af Amer >60 >60 mL/min   GFR calc Af Amer >60 >60 mL/min   Anion gap 7 5 - 15  CBC     Status: None   Collection Time: 07/04/17  5:08 PM  Result Value Ref Range   WBC 5.0 3.6 - 11.0 K/uL   RBC 4.59 3.80 - 5.20 MIL/uL   Hemoglobin 12.8 12.0 - 16.0 g/dL   HCT 01.037.0 27.235.0 - 53.647.0 %   MCV 80.7 80.0 - 100.0 fL   MCH 28.0 26.0 - 34.0 pg   MCHC 34.7 32.0 - 36.0 g/dL   RDW 64.414.2 03.411.5 - 74.214.5 %   Platelets 193 150 - 440 K/uL   ____________________________________________  EKG My review and personal interpretation at Time: dizziness   Indication: dizziness  Rate: 90  Rhythm: sinus with occasional PAC Axis: normal Other: no stemi, nonspecific st abn, poor r wave progression ____________________________________________  RADIOLOGY  I personally reviewed all radiographic images ordered to evaluate for the above acute complaints and reviewed radiology reports and findings.  These findings were personally discussed with the patient.  Please see medical record for  radiology report.  ____________________________________________   PROCEDURES  Procedure(s) performed:  Procedures    Critical Care performed: no ____________________________________________   INITIAL IMPRESSION / ASSESSMENT AND PLAN / ED COURSE  Pertinent labs & imaging results that were available during my care of the patient were reviewed by me and considered in my medical decision making (see chart for details).   DDX: bppv, iph, cva, mass, labyrinthitis, uti, orthostasis  Ellar Kyra LeylandO Lastra is a 84 y.o. who presents to the ED with symptoms as described above.  Patient had extensive work-up to evaluate for causes of her symptoms the patient's differential was significant and broad.  No evidence of CVA on CT and based on her symptoms and risk factors MRI ordered to evaluate  for mass.  No evidence of acute CVA.  Blood work is otherwise reassuring the patient was finally able to provide urine and did have nitrite positive UTI therefore patient given IV antibiotics will be admitted to the hospital for further medical management.      As part of my medical decision making, I reviewed the following data within the electronic MEDICAL RECORD NUMBER Nursing notes reviewed and incorporated, Labs reviewed, notes from prior ED visits.  ____________________________________________   FINAL CLINICAL IMPRESSION(S) / ED DIAGNOSES  Final diagnoses:  Dizziness  Acute cystitis without hematuria      NEW MEDICATIONS STARTED DURING THIS VISIT:  New Prescriptions   No medications on file     Note:  This document was prepared using Dragon voice recognition software and may include unintentional dictation errors.    Willy Eddy, MD 07/05/17 [phone]

## 2017-07-04 NOTE — ED Triage Notes (Signed)
Pt states that after lunch today, approx 1500 she felt dizzy and had emesis X1. Felt better, then developed dizziness again. Currently feels dizzy with standing. PCP advised patient to come here for evaluation. Pt alert and oriented X4, active, cooperative, pt in NAD. RR even and unlabored, color WNL.

## 2017-07-05 ENCOUNTER — Other Ambulatory Visit: Payer: Self-pay

## 2017-07-05 DIAGNOSIS — E860 Dehydration: Secondary | ICD-10-CM | POA: Diagnosis not present

## 2017-07-05 DIAGNOSIS — R42 Dizziness and giddiness: Secondary | ICD-10-CM | POA: Diagnosis not present

## 2017-07-05 LAB — CBC
HCT: 34.5 % — ABNORMAL LOW (ref 35.0–47.0)
Hemoglobin: 11.8 g/dL — ABNORMAL LOW (ref 12.0–16.0)
MCH: 28 pg (ref 26.0–34.0)
MCHC: 34.2 g/dL (ref 32.0–36.0)
MCV: 82 fL (ref 80.0–100.0)
PLATELETS: 170 10*3/uL (ref 150–440)
RBC: 4.21 MIL/uL (ref 3.80–5.20)
RDW: 14.2 % (ref 11.5–14.5)
WBC: 5.3 10*3/uL (ref 3.6–11.0)

## 2017-07-05 LAB — BASIC METABOLIC PANEL
Anion gap: 6 (ref 5–15)
BUN: 20 mg/dL (ref 6–20)
CALCIUM: 8.4 mg/dL — AB (ref 8.9–10.3)
CO2: 24 mmol/L (ref 22–32)
CREATININE: 0.76 mg/dL (ref 0.44–1.00)
Chloride: 111 mmol/L (ref 101–111)
GFR calc non Af Amer: >60 mL/min (ref >60)
Glucose, Bld: 99 mg/dL (ref 65–99)
Potassium: 3 mmol/L — ABNORMAL LOW (ref 3.5–5.1)
Sodium: 141 mmol/L (ref 135–145)

## 2017-07-05 LAB — GLUCOSE, CAPILLARY: GLUCOSE-CAPILLARY: 82 mg/dL (ref 65–99)

## 2017-07-05 MED ORDER — NITROFURANTOIN MONOHYD MACRO 100 MG PO CAPS
100.0000 mg | ORAL_CAPSULE | Freq: Two times a day (BID) | ORAL | Status: DC
  Administered 2017-07-06: 100 mg via ORAL
  Filled 2017-07-05 (×3): qty 1

## 2017-07-05 NOTE — Evaluation (Signed)
Physical Therapy Evaluation Patient Details Name: Vanessa Logan MRN: [id number] DOB: 1926-05-09 Today's Date: 07/05/2017   History of Present Illness  84 y.o. female with a known history of hypertension, urinary incontinence, atrial fibrillation, on Eliquis.  Clinical Impression  Pt did well with PT exam and though she was initially slow with mobility and ambulation she responded well with cuing/gait training apart from exam.  Overall she feels that she is close to her baseline once she warmed up and got more comfortable with everything.  She did not have any LOBs but was reliant on the walker and showed some hesitancy.  Overall pt is safe, feels good about being able to stay active at home and do exercises that she has gotten from PT in the past.  No dizziness or other significant symptoms with activity, safe to return home with the help that she has.    Follow Up Recommendations No PT follow up    Equipment Recommendations       Recommendations for Other Services       Precautions / Restrictions Precautions Precautions: Fall Restrictions Weight Bearing Restrictions: No      Mobility  Bed Mobility Overal bed mobility: Independent             General bed mobility comments: Pt able to get herlself to EOB w/o issue  Transfers Overall transfer level: Modified independent Equipment used: Rolling walker (2 wheeled)             General transfer comment: Cues for hand placement and general sequencing  Ambulation/Gait Ambulation/Gait assistance: Supervision Ambulation Distance (Feet): 200 Feet Assistive device: Rolling walker (2 wheeled)       General Gait Details: Pt initially very slow with ambulation (also with significant R knee valgus) she did improve cadence and confidence with increased time and ultimately was able to circumambulate the nurses station safely   Stairs            Wheelchair Mobility    Modified Rankin (Stroke Patients Only)        Balance Overall balance assessment: Modified Independent                                           Pertinent Vitals/Pain Pain Assessment: (chronic arthritic pain, spine, b/l knees R>L)    Home Living Family/patient expects to be discharged to:: Private residence Living Arrangements: Alone Available Help at Discharge: Family;Available PRN/intermittently(children call 2x/day, step-daughter lives close/helps)   Home Access: Ramped entrance       Home Equipment: Walker - 4 wheels;Walker - standard;Cane - single point      Prior Function Level of Independence: Independent with assistive device(s)         Comments: Pt still drives and runs some errands, has been needing to use ADs more consistently     Hand Dominance        Extremity/Trunk Assessment   Upper Extremity Assessment Upper Extremity Assessment: Generalized weakness(age appropriate limitations)    Lower Extremity Assessment Lower Extremity Assessment: Generalized weakness(age appropriate limitations)       Communication   Communication: No difficulties  Cognition Arousal/Alertness: Awake/alert Behavior During Therapy: WFL for tasks assessed/performed Overall Cognitive Status: Within Functional Limits for tasks assessed  General Comments      Exercises     Assessment/Plan    PT Assessment Patent does not need any further PT services  PT Problem List Decreased strength;Decreased knowledge of use of DME;Decreased activity tolerance       PT Treatment Interventions      PT Goals (Current goals can be found in the Care Plan section)  Acute Rehab PT Goals Patient Stated Goal: go home tomorrow PT Goal Formulation: All assessment and education complete, DC therapy    Frequency     Barriers to discharge        Co-evaluation               AM-PAC PT "6 Clicks" Daily Activity  Outcome Measure Difficulty turning  over in bed (including adjusting bedclothes, sheets and blankets)?: None Difficulty moving from lying on back to sitting on the side of the bed? : None Difficulty sitting down on and standing up from a chair with arms (e.g., wheelchair, bedside commode, etc,.)?: A Little Help needed moving to and from a bed to chair (including a wheelchair)?: None Help needed walking in hospital room?: None Help needed climbing 3-5 steps with a railing? : A Little 6 Click Score: 22    End of Session Equipment Utilized During Treatment: Gait belt Activity Tolerance: Patient limited by fatigue Patient left: with chair alarm set;with call bell/phone within reach   PT Visit Diagnosis: Muscle weakness (generalized) (M62.81);Difficulty in walking, not elsewhere classified (R26.2)    Time: [id number] PT Time Calculation (min) (ACUTE ONLY): 25 min   Charges:   PT Evaluation $PT Eval Low Complexity: 1 Low PT Treatments $Gait Training: 8-22 mins        Malachi ProGalen R Roseanna Koplin, DPT 07/05/2017, 9:58 AM

## 2017-07-05 NOTE — Evaluation (Signed)
Occupational Therapy Evaluation Patient Details Name: Vanessa Logan MRN: [id number] DOB: [birth year] Today's Date: 07/05/2017    History of Present Illness 84 y.o. female with a known history of hypertension, urinary incontinence, atrial fibrillation, on Eliquis.   Clinical Impression   Pt seen for OT evaluation this date. Prior to hospital admission, pt was modified indep w/ RW, able to perform errands, drive some.  Pt lives by herself but has family checking in on her frequently.  Currently pt near baseline for functional mobility and self care skills. No dizziness or nausea. Pt/family educated in UTI prevention and symptom identification to support self mgt of health and minimize risks of UTIs in the future as well as minimize falls risk. No skilled OT follow up recommended at this time. Will sign off. Please re-consult if additional needs arise.    Follow Up Recommendations  No OT follow up    Equipment Recommendations  None recommended by OT    Recommendations for Other Services       Precautions / Restrictions Precautions Precautions: Fall Restrictions Weight Bearing Restrictions: No      Mobility Bed Mobility               General bed mobility comments: deferred, up in recliner  Transfers Overall transfer level: Modified independent Equipment used: Rolling walker (2 wheeled)                  Balance Overall balance assessment: Modified Independent                                         ADL either performed or assessed with clinical judgement   ADL Overall ADL's : At baseline                                             Vision Patient Visual Report: No change from baseline       Perception     Praxis      Pertinent Vitals/Pain Pain Assessment: No/denies pain(chronic arthritic pain, spine, b/l knees R>L)     Hand Dominance     Extremity/Trunk Assessment Upper Extremity Assessment Upper Extremity  Assessment: Generalized weakness(age appropriate limitations)   Lower Extremity Assessment Lower Extremity Assessment: Generalized weakness(age appropriate limitations)       Communication Communication Communication: No difficulties   Cognition Arousal/Alertness: Awake/alert Behavior During Therapy: WFL for tasks assessed/performed Overall Cognitive Status: Within Functional Limits for tasks assessed                                     General Comments       Exercises Other Exercises Other Exercises: Pt/family educated in UTI risks and prevention, falls prevention strategies. Verbalized understanding.   Shoulder Instructions      Home Living Family/patient expects to be discharged to:: Private residence Living Arrangements: Alone Available Help at Discharge: Family;Available PRN/intermittently(children call 2x/day, step-daughter lives close/helps)   Home Access: Ramped entrance                     Home Equipment: Walker - 4 wheels;Walker - standard;Cane - single point          Prior Functioning/Environment Level  of Independence: Independent with assistive device(s)        Comments: Pt still drives and runs some errands, has been needing to use ADs more consistently        OT Problem List:        OT Treatment/Interventions:      OT Goals(Current goals can be found in the care plan section) Acute Rehab OT Goals Patient Stated Goal: go home tomorrow OT Goal Formulation: All assessment and education complete, DC therapy  OT Frequency:     Barriers to D/C:            Co-evaluation              AM-PAC PT "6 Clicks" Daily Activity     Outcome Measure Help from another person eating meals?: None Help from another person taking care of personal grooming?: None Help from another person toileting, which includes using toliet, bedpan, or urinal?: None Help from another person bathing (including washing, rinsing, drying)?:  None Help from another person to put on and taking off regular upper body clothing?: None Help from another person to put on and taking off regular lower body clothing?: None 6 Click Score: 24   End of Session    Activity Tolerance: Patient tolerated treatment well Patient left: in chair;with call bell/phone within reach;with chair alarm set;with family/visitor present  OT Visit Diagnosis: Other abnormalities of gait and mobility (R26.89);Muscle weakness (generalized) (M62.81)                Time: [id number] OT Time Calculation (min): 10 min Charges:  OT General Charges $OT Visit: 1 Visit OT Evaluation $OT Eval Low Complexity: 1 Low  Richrd PrimeJamie Stiller, MPH, MS, OTR/L ascom [phone] 07/05/17, 4:05 PM

## 2017-07-05 NOTE — Progress Notes (Addendum)
[facility] - [place] [facility]   PATIENT NAME: Vanessa Logan    MR#:  [id number]  DATE OF BIRTH:  December 05, 1926  SUBJECTIVE:   Came in with dizziness and back pain feels a lot better. Out in the chair. Ate breakfast. Denies any dizziness. Denies ringing in the ER OR your discharge. No headache. REVIEW OF SYSTEMS:   Review of Systems  Constitutional: Negative for chills, fever and weight loss.  HENT: Negative for ear discharge, ear pain and nosebleeds.   Eyes: Negative for blurred vision, pain and discharge.  Respiratory: Negative for sputum production, shortness of breath, wheezing and stridor.   Cardiovascular: Negative for chest pain, palpitations, orthopnea and PND.  Gastrointestinal: Negative for abdominal pain, diarrhea, nausea and vomiting.  Genitourinary: Negative for frequency and urgency.  Musculoskeletal: Positive for back pain. Negative for joint pain.  Neurological: Positive for weakness. Negative for sensory change, speech change and focal weakness.  Psychiatric/Behavioral: Negative for depression and hallucinations. The patient is not nervous/anxious.    Tolerating Diet:yes Tolerating PT: no PT needed  DRUG ALLERGIES:   Allergies  Allergen Reactions  . Neosporin [Neomycin-Bacitracin Zn-Polymyx] Itching and Swelling  . Keflex [Cephalexin] Rash    VITALS:  Blood pressure 113/60, pulse 93, temperature 98.5 F (36.9 C), temperature source Oral, resp. rate 16, height 5\' 8"  (1.727 m), weight 83.1 kg (183 lb 1.6 oz), SpO2 95 %.  PHYSICAL EXAMINATION:   Physical Exam  GENERAL:  84 y.o.-year-old patient lying in the bed with no acute distress.  EYES: Pupils equal, round, reactive to light and accommodation. No scleral icterus. Extraocular muscles intact.  HEENT: Head atraumatic, normocephalic. Oropharynx and nasopharynx clear.  NECK:  Supple, no jugular venous distention. No thyroid enlargement, no tenderness.  LUNGS: Normal breath sounds  bilaterally, no wheezing, rales, rhonchi. No use of accessory muscles of respiration.  CARDIOVASCULAR: S1, S2 normal. No murmurs, rubs, or gallops.  ABDOMEN: Soft, nontender, nondistended. Bowel sounds present. No organomegaly or mass.  EXTREMITIES: No cyanosis, clubbing or edema b/l.    NEUROLOGIC: Cranial nerves II through XII are intact. No focal Motor or sensory deficits b/l.   PSYCHIATRIC:  patient is alert and oriented x 3.  SKIN: No obvious rash, lesion, or ulcer.   LABORATORY PANEL:  CBC Recent Labs  Lab 07/05/17 0431  WBC 5.3  HGB 11.8*  HCT 34.5*  PLT 170    Chemistries  Recent Labs  Lab 07/05/17 0431  NA 141  K 3.0*  CL 111  CO2 24  GLUCOSE 99  BUN 20  CREATININE 0.76  CALCIUM 8.4*   Cardiac Enzymes Recent Labs  Lab 07/04/17 1930  TROPONINI <0.03   RADIOLOGY:  Ct Head Wo Contrast  Result Date: 07/04/2017 CLINICAL DATA:  Ataxia.  Altered level of consciousness. EXAM: CT HEAD WITHOUT CONTRAST TECHNIQUE: Contiguous axial images were obtained from the base of the skull through the vertex without intravenous contrast. COMPARISON:  CT head 06/28/2016 FINDINGS: Brain: Mild atrophy. Negative for hydrocephalus. Negative for acute infarct. Negative for hemorrhage or mass. No midline shift. Vascular: Negative for hyperdense vessel Skull: Negative Sinuses/Orbits: Paranasal sinuses clear.  Bilateral cataract removal Other: None IMPRESSION: No acute intracranial abnormality. Electronically Signed   By: Marlan Palauharles  Clark M.D.   On: 07/04/2017 19:47   Mr Brain Wo Contrast  Result Date: 07/04/2017 CLINICAL DATA:  Dizziness and vomiting after lunch today, recurrent dizziness. History of hypertension. EXAM: MRI HEAD WITHOUT CONTRAST TECHNIQUE: Multiplanar, multiecho pulse sequences of the brain and  surrounding structures were obtained without intravenous contrast. COMPARISON:  CT head July 04, 2017 FINDINGS: INTRACRANIAL CONTENTS: No reduced diffusion to suggest acute ischemia. No  susceptibility artifact to suggest hemorrhage. The ventricles and sulci are normal for patient's age. Pain supratentorial white matter FLAIR T2 hyperintensities compatible mild chronic small vessel ischemic changes, less expected for age. No suspicious parenchymal signal, masses, mass effect. No abnormal extra-axial fluid collections. No extra-axial masses. VASCULAR: Normal major intracranial vascular flow voids present at skull base. SKULL AND UPPER CERVICAL SPINE: No abnormal sellar expansion. No suspicious calvarial bone marrow signal. Craniocervical junction maintained. SINUSES/ORBITS: The mastoid air-cells and included paranasal sinuses are well-aerated.The included ocular globes and orbital contents are non-suspicious. Status post bilateral ocular lens implants. OTHER: None. IMPRESSION: 1. Normal noncontrast MRI of the head for age. Electronically Signed   By: Awilda Metro M.D.   On: 07/04/2017 22:17   ASSESSMENT AND PLAN:  Vanessa Logan  is a 84 y.o. female with a known history of hypertension, urinary incontinence, atrial fibrillation, on Eliquis. Patient was brought to emergency room for dizziness, generalized weakness and nausea, started acutely around noon today.  Her symptoms are worse when she is up, ambulating.  She describes the dizziness as severe weakness, she feels drained. She denies any headache, focal weakness, numbness or tingling.  No chest pain, palpitations, no fever/chills  1.  Dizziness and generalized weakness -exact exact etiology unclear how her symptoms are resolved. - MRI of the brain negative for stroke. -Could be clinical dehydration with UTI since patient was out in her driveway washing her car must have been exhausted with that and had not eaten till late afternoon.\ -Received IV fluids,feels a lot better. -Ambulated with physical therapy no needs recommended -advised ENT outpatient evaluation if her dizziness process. Patient has not had the symptoms in the  past.  2.  Acute UTI, will start IV rocephin waiting for urine culture result.  3.  Urinary incontinence.  Will D/C hydrochlorothiazide.  Continue to use pads, as needed.  4.  Paroxysmal atrial fibrillation, currently rate controlled, in sinus rhythm.   -Continue treatment with beta-blocker and Eliquis.   5.  Hypertension, stable, restart home medications.  Patient seems to be improving. Physical therapy's outpatient no needs. If continues to improve will discharge her tomorrow. Patient agreeable.  Case discussed with Care Management/Social Worker. Management plans discussed with the patient, family and they are in agreement.  CODE STATUS: full  DVT Prophylaxis: eliquis  TOTAL TIME TAKING CARE OF THIS PATIENT: *30* minutes.  >50% time spent on counselling and coordination of care  POSSIBLE D/C IN *1* DAYS, DEPENDING ON CLINICAL CONDITION.  Note: This dictation was prepared with Dragon dictation along with smaller phrase technology. Any transcriptional errors that result from this process are unintentional.  Vanessa Logan M.D on 07/05/2017 at 8:54 AM  Between 7am to 6pm - Pager - [phone]  After 6pm go to [email or web address] - Social research officer, government  [facility]  Office  [phone]  CC: Primary care physician; Marguarite Arbour, MDPatient ID: Vanessa Logan, female   DOB: 1926/01/29, 84 y.o.   MRN: [id number]

## 2017-07-06 DIAGNOSIS — R42 Dizziness and giddiness: Secondary | ICD-10-CM | POA: Diagnosis not present

## 2017-07-06 DIAGNOSIS — E860 Dehydration: Secondary | ICD-10-CM | POA: Diagnosis not present

## 2017-07-06 LAB — POTASSIUM: Potassium: 3.6 mmol/L (ref 3.5–5.1)

## 2017-07-06 LAB — GLUCOSE, CAPILLARY: GLUCOSE-CAPILLARY: 81 mg/dL (ref 65–99)

## 2017-07-06 MED ORDER — NITROFURANTOIN MONOHYD MACRO 100 MG PO CAPS: 100.0000 mg | ORAL_CAPSULE | Freq: Two times a day (BID) | ORAL | 0 refills | Status: DC

## 2017-07-06 NOTE — Progress Notes (Signed)
Received Md order to discharge patient to home reviewed homes , meds, follow up appointments, prescriptions and discharge instructions with patient and patient verbalized understanding

## 2017-07-06 NOTE — Discharge Summary (Signed)
[facility] - [place] at [facility]   PATIENT NAME: Vanessa Logan    MR#:  [id number]  DATE OF BIRTH:  [birth year]  DATE OF ADMISSION:  07/04/2017 ADMITTING PHYSICIAN: Cammy CopaAngela Maier, MD  DATE OF DISCHARGE: 07/06/2017  PRIMARY CARE PHYSICIAN: Marguarite ArbourSparks, Jeffrey D, MD    ADMISSION DIAGNOSIS:  Dizziness  DISCHARGE DIAGNOSIS:  Dizziness--now resolved UTI weakness  SECONDARY DIAGNOSIS:   Past Medical History:  Diagnosis Date  . Atrial fibrillation (HCC)   . Breast screening, unspecified 06/18/11  . Diffuse cystic mastopathy   . Family history of malignant neoplasm of breast 2013  . Obesity, unspecified 2013  . Screening for obesity 2013  . Special screening for malignant neoplasms, colon 06/21/2011  . Unspecified essential hypertension     HOSPITAL COURSE:   RubySmithis a89 y.o.femalewith a known history ofhypertension, urinary incontinence, atrial fibrillation, on Eliquis. Patient was brought to emergency room for dizziness, generalized weakness and nausea,started acutely around noon today. Her symptoms are worse when she is up, ambulating. She describes the dizziness as severe weakness,she feels drained. She denies any headache,focal weakness, numbness or tingling.No chest pain, palpitations,no fever/chills  1.Dizziness and generalized weakness -exact exact etiology unclear how her symptoms are resolved. - MRI of the brain negative for stroke. -Could be clinical dehydration with UTI since patient was out in her driveway washing her car must have been exhausted with that and had not eaten till late afternoon.\ -Received IV fluids,feels a lot better. -Ambulated with physical therapy no needs recommended -advised ENT outpatient evaluation if her dizziness process. Patient has not had the symptoms in the past.  2.Acute UTI,recieved IV rocephin---change to po NFT. Urine culture was not sent from the ER  3.Urinary  incontinence. -cont mybertiq and oxytocin  4.Paroxysmal atrial fibrillation,currently rate controlled, in sinus rhythm. -Continue treatment with beta-blocker and Eliquis.   5.Hypertension, stable, restart home medications.  Patient seems tohave improved. Physical therapy's outpatient no needs.  D/c home Spoke withdter Vanessa LeatherwoodKatherine over the phone.   CONSULTS OBTAINED:    DRUG ALLERGIES:   Allergies  Allergen Reactions  . Neosporin [Neomycin-Bacitracin Zn-Polymyx] Itching and Swelling  . Keflex [Cephalexin] Rash    DISCHARGE MEDICATIONS:   Allergies as of 07/06/2017      Reactions   Neosporin [neomycin-bacitracin Zn-polymyx] Itching, Swelling   Keflex [cephalexin] Rash      Medication List    TAKE these medications   ELIQUIS 5 MG Tabs tablet Generic drug:  apixaban Take 5 mg by mouth 2 (two) times daily.   hydrochlorothiazide 50 MG tablet Commonly known as:  HYDRODIURIL Take 50 mg by mouth daily.   losartan 50 MG tablet Commonly known as:  COZAAR Take 1 tablet by mouth daily.   metoprolol succinate 25 MG 24 hr tablet Commonly known as:  TOPROL-XL Take 12.5 mg by mouth at bedtime.   MYRBETRIQ 50 MG Tb24 tablet Generic drug:  mirabegron ER Take 50 mg by mouth daily.   nitrofurantoin (macrocrystal-monohydrate) 100 MG capsule Commonly known as:  MACROBID Take 1 capsule (100 mg total) by mouth every 12 (twelve) hours.   oxybutynin 10 MG 24 hr tablet Commonly known as:  DITROPAN-XL Take 10 mg by mouth 2 (two) times daily.   potassium chloride SA 20 MEQ tablet Commonly known as:  K-DUR,KLOR-CON Take 40 mEq by mouth 2 (two) times daily.       If you experience worsening of your admission symptoms, develop shortness of breath, life threatening emergency, suicidal or homicidal thoughts you  must seek medical attention immediately by calling 911 or calling your MD immediately  if symptoms less severe.  You Must read complete instructions/literature  along with all the possible adverse reactions/side effects for all the Medicines you take and that have been prescribed to you. Take any new Medicines after you have completely understood and accept all the possible adverse reactions/side effects.   Please note  You were cared for by a hospitalist during your hospital stay. If you have any questions about your discharge medications or the care you received while you were in the hospital after you are discharged, you can call the unit and asked to speak with the hospitalist on call if the hospitalist that took care of you is not available. Once you are discharged, your primary care physician will handle any further medical issues. Please note that NO REFILLS for any discharge medications will be authorized once you are discharged, as it is imperative that you return to your primary care physician (or establish a relationship with a primary care physician if you do not have one) for your aftercare needs so that they can reassess your need for medications and monitor your lab values. Today   SUBJECTIVE   Doing well No dizziness  VITAL SIGNS:  Blood pressure (!) 126/52, pulse (!) 105, temperature 97.9 F (36.6 C), temperature source Oral, resp. rate 20, height 5\' 8"  (1.727 m), weight 87.2 kg (192 lb 3.9 oz), SpO2 96 %.  I/O:    Intake/Output Summary (Last 24 hours) at 07/06/2017 0858 Last data filed at 07/05/2017 1024 Gross per 24 hour  Intake 120 ml  Output -  Net 120 ml    PHYSICAL EXAMINATION:  GENERAL:  84 y.o.-year-old patient lying in the bed with no acute distress.  EYES: Pupils equal, round, reactive to light and accommodation. No scleral icterus. Extraocular muscles intact.  HEENT: Head atraumatic, normocephalic. Oropharynx and nasopharynx clear.  NECK:  Supple, no jugular venous distention. No thyroid enlargement, no tenderness.  LUNGS: Normal breath sounds bilaterally, no wheezing, rales,rhonchi or crepitation. No use of  accessory muscles of respiration.  CARDIOVASCULAR: S1, S2 normal. No murmurs, rubs, or gallops.  ABDOMEN: Soft, non-tender, non-distended. Bowel sounds present. No organomegaly or mass.  EXTREMITIES: No pedal edema, cyanosis, or clubbing.  NEUROLOGIC: Cranial nerves II through XII are intact. Muscle strength 5/5 in all extremities. Sensation intact. Gait not checked.  PSYCHIATRIC: The patient is alert and oriented x 3.  SKIN: No obvious rash, lesion, or ulcer.   DATA REVIEW:   CBC  Recent Labs  Lab 07/05/17 0431  WBC 5.3  HGB 11.8*  HCT 34.5*  PLT 170    Chemistries  Recent Labs  Lab 07/05/17 0431  NA 141  K 3.0*  CL 111  CO2 24  GLUCOSE 99  BUN 20  CREATININE 0.76  CALCIUM 8.4*    Microbiology Results   No results found for this or any previous visit (from the past 240 hour(s)).  RADIOLOGY:  Ct Head Wo Contrast  Result Date: 07/04/2017 CLINICAL DATA:  Ataxia.  Altered level of consciousness. EXAM: CT HEAD WITHOUT CONTRAST TECHNIQUE: Contiguous axial images were obtained from the base of the skull through the vertex without intravenous contrast. COMPARISON:  CT head 06/28/2016 FINDINGS: Brain: Mild atrophy. Negative for hydrocephalus. Negative for acute infarct. Negative for hemorrhage or mass. No midline shift. Vascular: Negative for hyperdense vessel Skull: Negative Sinuses/Orbits: Paranasal sinuses clear.  Bilateral cataract removal Other: None IMPRESSION: No acute intracranial abnormality. Electronically  Signed   By: Marlan Palau M.D.   On: 07/04/2017 19:47   Mr Brain Wo Contrast  Result Date: 07/04/2017 CLINICAL DATA:  Dizziness and vomiting after lunch today, recurrent dizziness. History of hypertension. EXAM: MRI HEAD WITHOUT CONTRAST TECHNIQUE: Multiplanar, multiecho pulse sequences of the brain and surrounding structures were obtained without intravenous contrast. COMPARISON:  CT head July 04, 2017 FINDINGS: INTRACRANIAL CONTENTS: No reduced diffusion to  suggest acute ischemia. No susceptibility artifact to suggest hemorrhage. The ventricles and sulci are normal for patient's age. Pain supratentorial white matter FLAIR T2 hyperintensities compatible mild chronic small vessel ischemic changes, less expected for age. No suspicious parenchymal signal, masses, mass effect. No abnormal extra-axial fluid collections. No extra-axial masses. VASCULAR: Normal major intracranial vascular flow voids present at skull base. SKULL AND UPPER CERVICAL SPINE: No abnormal sellar expansion. No suspicious calvarial bone marrow signal. Craniocervical junction maintained. SINUSES/ORBITS: The mastoid air-cells and included paranasal sinuses are well-aerated.The included ocular globes and orbital contents are non-suspicious. Status post bilateral ocular lens implants. OTHER: None. IMPRESSION: 1. Normal noncontrast MRI of the head for age. Electronically Signed   By: Awilda Metro M.D.   On: 07/04/2017 22:17     Management plans discussed with the patient, family and they are in agreement.  CODE STATUS:     Code Status Orders  (From admission, onward)        Start     Ordered   07/04/17 2351  Full code  Continuous     07/04/17 2351    Code Status History    This patient has a current code status but no historical code status.      TOTAL TIME TAKING CARE OF THIS PATIENT: *40* minutes.    Enedina Finner M.D on 07/06/2017 at 8:58 AM  Between 7am to 6pm - Pager - [phone] After 6pm go to [email or web address] - Social research officer, government  [facility]  Office  [phone]  CC: Primary care physician; Marguarite Arbour, MD

## 2017-11-24 ENCOUNTER — Emergency Department
Admission: EM | Admit: 2017-11-24 | Discharge: 2017-11-24 | Disposition: A | Discharge status: Home / Self Care | Payer: Medicare Other | Attending: Emergency Medicine | Admitting: Emergency Medicine

## 2017-11-24 DIAGNOSIS — Z79899 Other long term (current) drug therapy: Secondary | ICD-10-CM | POA: Diagnosis not present

## 2017-11-24 DIAGNOSIS — R42 Dizziness and giddiness: Secondary | ICD-10-CM | POA: Insufficient documentation

## 2017-11-24 DIAGNOSIS — Z7901 Long term (current) use of anticoagulants: Secondary | ICD-10-CM | POA: Insufficient documentation

## 2017-11-24 DIAGNOSIS — I10 Essential (primary) hypertension: Secondary | ICD-10-CM | POA: Diagnosis not present

## 2017-11-24 DIAGNOSIS — I4891 Unspecified atrial fibrillation: Secondary | ICD-10-CM | POA: Diagnosis not present

## 2017-11-24 LAB — BASIC METABOLIC PANEL
ANION GAP: 9 (ref 5–15)
BUN: 33 mg/dL — ABNORMAL HIGH (ref 8–23)
CHLORIDE: 106 mmol/L (ref 98–111)
CO2: 24 mmol/L (ref 22–32)
Calcium: 8.9 mg/dL (ref 8.9–10.3)
Creatinine, Ser: 0.93 mg/dL (ref 0.44–1.00)
GFR calc non Af Amer: 53 mL/min — ABNORMAL LOW (ref >60)
Glucose, Bld: 102 mg/dL — ABNORMAL HIGH (ref 70–99)
POTASSIUM: 4 mmol/L (ref 3.5–5.1)
Sodium: 139 mmol/L (ref 135–145)

## 2017-11-24 LAB — CBC
HCT: 40.5 % (ref 36.0–46.0)
HEMOGLOBIN: 12.7 g/dL (ref 12.0–15.0)
MCH: 26.8 pg (ref 26.0–34.0)
MCHC: 31.4 g/dL (ref 30.0–36.0)
MCV: 85.6 fL (ref 80.0–100.0)
NRBC: 0 % (ref 0.0–0.2)
Platelets: 182 10*3/uL (ref 150–400)
RBC: 4.73 MIL/uL (ref 3.87–5.11)
RDW: 13.2 % (ref 11.5–15.5)
WBC: 5 10*3/uL (ref 4.0–10.5)

## 2017-11-24 LAB — URINALYSIS, ROUTINE W REFLEX MICROSCOPIC
Bilirubin Urine: NEGATIVE
Glucose, UA: NEGATIVE mg/dL
Hgb urine dipstick: NEGATIVE
KETONES UR: NEGATIVE mg/dL
LEUKOCYTES UA: NEGATIVE
NITRITE: NEGATIVE
Protein, ur: NEGATIVE mg/dL
SPECIFIC GRAVITY, URINE: 1.008 (ref 1.005–1.030)
pH: 5 (ref 5.0–8.0)

## 2017-11-24 LAB — TROPONIN I
Troponin I: <0.03 ng/mL (ref <0.03)
Troponin I: <0.03 ng/mL (ref <0.03)

## 2017-11-24 NOTE — ED Triage Notes (Signed)
Pt states she felt dizzy today that lasted about 15 minutes. States it felt like room was spinning around. A&O, in wheelchair. States feeling better but still doesn't feel right. Speech clear. States only change is that she had her heart racing so increased metoprolol last week.

## 2017-11-24 NOTE — Discharge Instructions (Addendum)
Please follow-up with your doctor later on this coming week.  Please return here if you have another attack of vertigo that lasts longer than half an hour.

## 2017-11-24 NOTE — ED Provider Notes (Signed)
[facility] Emergency Department Provider Note   ____________________________________________   First MD Initiated Contact with Patient 11/24/17 1623     (approximate)  I have reviewed the triage vital signs and the nursing notes.   HISTORY  Chief Complaint Dizziness   HPI Vanessa Logan is a 84 y.o. female patient reports about 15 minutes of vertigo spinning.  This resolved completely.  She still feels a little nauseated.  She had this once many years ago.  Has A. fib.  She is taking Eliquis.   Past Medical History:  Diagnosis Date  . Atrial fibrillation (HCC)   . Breast screening, unspecified 06/18/11  . Diffuse cystic mastopathy   . Family history of malignant neoplasm of breast 2013  . Obesity, unspecified 2013  . Screening for obesity 2013  . Special screening for malignant neoplasms, colon 06/21/2011  . Unspecified essential hypertension     Patient Active Problem List   Diagnosis Date Noted  . Acute UTI 07/04/2017  . Fibrocystic breast disease 06/29/2012  . Family history of malignant neoplasm of breast     Past Surgical History:  Procedure Laterality Date  . APPENDECTOMY  1950  . BREAST BIOPSY Bilateral 1992   neg  . BREAST BIOPSY Right 1996   neg  . BREAST BIOPSY Right 2005   neg  . COLONOSCOPY  2007  . COLONOSCOPY  2013   Dr Ricki Rodriguez  . COLONOSCOPY W/ POLYPECTOMY  2004  . VARICOSE VEIN SURGERY  1983    Prior to Admission medications   Medication Sig Start Date End Date Taking? Authorizing Provider  apixaban (ELIQUIS) 5 MG TABS tablet Take 5 mg by mouth 2 (two) times daily.    [provider]  hydrochlorothiazide (HYDRODIURIL) 50 MG tablet Take 50 mg by mouth daily.    [provider]  losartan (COZAAR) 50 MG tablet Take 1 tablet by mouth daily. 08/26/14   [provider]  metoprolol succinate (TOPROL-XL) 25 MG 24 hr tablet Take 12.5 mg by mouth at bedtime.     [provider]  MYRBETRIQ 50  MG TB24 tablet Take 50 mg by mouth daily.    [provider]  nitrofurantoin, macrocrystal-monohydrate, (MACROBID) 100 MG capsule Take 1 capsule (100 mg total) by mouth every 12 (twelve) hours. 07/06/17   Enedina Finner, MD  oxybutynin (DITROPAN-XL) 10 MG 24 hr tablet Take 10 mg by mouth 2 (two) times daily. 08/26/14   [provider]  potassium chloride SA (K-DUR,KLOR-CON) 20 MEQ tablet Take 40 mEq by mouth 2 (two) times daily.  03/05/14   [provider]    Allergies Neosporin [neomycin-bacitracin zn-polymyx] and Keflex [cephalexin]  Family History  Problem Relation Age of Onset  . Breast cancer Sister 55  . Pancreatic cancer Sister   . Colon cancer Daughter   . Breast cancer Sister 27    Social History Social History   Tobacco Use  . Smoking status: Never Smoker  . Smokeless tobacco: Never Used  Substance Use Topics  . Alcohol use: No    Alcohol/week: 0.0 standard drinks  . Drug use: No    Review of Systems  Constitutional: No fever/chills Eyes: No visual changes. ENT: No sore throat. Cardiovascular: Denies chest pain. Respiratory: Denies shortness of breath. Gastrointestinal: No abdominal pain.   nausea, no vomiting.  No diarrhea.  No constipation. Genitourinary: Negative for dysuria. Musculoskeletal: Negative for back pain. Skin: Negative for rash. Neurological: Negative for headaches, focal weakness  ____________________________________________  PHYSICAL EXAM:  VITAL SIGNS: ED Triage Vitals  Enc Vitals Group     BP 11/24/17 1548 132/65     Pulse Rate 11/24/17 1548 72     Resp 11/24/17 1548 16     Temp 11/24/17 1548 98.4 F (36.9 C)     Temp Source 11/24/17 1548 Oral     SpO2 11/24/17 1548 96 %     Weight 11/24/17 1546 181 lb (82.1 kg)     Height 11/24/17 1546 5\' 7"  (1.702 m)     Head Circumference --      Peak Flow --      Pain Score 11/24/17 1546 0     Pain Loc --      Pain Edu? --      Excl. in GC? --    Constitutional:  Alert and oriented. Well appearing and in no acute distress. Eyes: Conjunctivae are normal. PERRL. EOMI. Head: Atraumatic. Nose: No congestion/rhinnorhea. Mouth/Throat: Mucous membranes are moist.  Oropharynx non-erythematous. Neck: No stridor Cardiovascular: Normal rate, regular rhythm. Grossly normal heart sounds.  Good peripheral circulation. Respiratory: Normal respiratory effort.  No retractions. Lungs CTAB. Gastrointestinal: Soft and nontender. No distention. No abdominal bruits. No CVA tenderness. Musculoskeletal: No lower extremity tenderness nor edema.  No joint effusions. Neurologic:  Normal speech and language. No gross focal neurologic deficits are appreciated.  Renal nerves II through XII are intact.  Visual fields were not checked.  Cerebellar finger-nose rapid alternating movements and hands and heel-to-shin are all normal.  Motor strength is 5/5 throughout sensation is intact. Skin:  Skin is warm, dry and intact. No rash noted. Psychiatric: Mood and affect are normal. Speech and behavior are normal.  ____________________________________________   LABS (all labs ordered are listed, but only abnormal results are displayed)  Labs Reviewed  BASIC METABOLIC PANEL - Abnormal; Notable for the following components:      Result Value   Glucose, Bld 102 (*)    BUN 33 (*)    GFR calc non Af Amer 53 (*)    All other components within normal limits  URINALYSIS, ROUTINE W REFLEX MICROSCOPIC - Abnormal; Notable for the following components:   Color, Urine STRAW (*)    APPearance CLEAR (*)    All other components within normal limits  CBC  TROPONIN I  TROPONIN I   ____________________________________________  EKG  EKG read interpreted by me shows normal sinus rhythm rate of 73 left axis first-degree AV block nonspecific intraventricular block decreased R wave progression in the precordial leads otherwise no acute  changes ____________________________________________  RADIOLOGY  ED MD interpretation:    Official radiology report(s): No results found.  ____________________________________________   PROCEDURES  Procedure(s) performed:  Procedures  Critical Care performed:   ____________________________________________   INITIAL IMPRESSION / ASSESSMENT AND PLAN / ED COURSE  Patient's nausea goes away with Zofran.  Patient's observed for some time and feels well.  I will let her go.  This appears to been an attack of vertigo.  Patient is taking Eliquis.  She has no headache or any other symptoms at all and these vertigo resolve spontaneously.  Will return for any further problems      ____________________________________________   FINAL CLINICAL IMPRESSION(S) / ED DIAGNOSES  Final diagnoses:  Vertigo     ED Discharge Orders    None       Note:  This document was prepared using Dragon voice recognition software and may include unintentional dictation errors.    Dorothea Glassman  F, MD 11/24/17 2120

## 2017-11-24 NOTE — ED Notes (Signed)
Patient denies pain and is resting comfortably.  

## 2017-12-01 ENCOUNTER — Other Ambulatory Visit: Payer: Self-pay | Admitting: Internal Medicine

## 2017-12-01 DIAGNOSIS — R42 Dizziness and giddiness: Secondary | ICD-10-CM

## 2017-12-07 ENCOUNTER — Ambulatory Visit
Admission: RE | Admit: 2017-12-07 | Discharge: 2017-12-07 | Disposition: A | Discharge status: Home / Self Care | Payer: Medicare Other | Source: Ambulatory Visit | Attending: Internal Medicine | Admitting: Internal Medicine

## 2017-12-07 DIAGNOSIS — R42 Dizziness and giddiness: Secondary | ICD-10-CM | POA: Diagnosis not present

## 2020-04-05 ENCOUNTER — Emergency Department: Payer: Medicare Other

## 2020-04-05 ENCOUNTER — Emergency Department
Admission: EM | Admit: 2020-04-05 | Discharge: 2020-04-05 | Disposition: A | Discharge status: Home / Self Care | Payer: Medicare Other | Attending: Emergency Medicine | Admitting: Emergency Medicine

## 2020-04-05 ENCOUNTER — Other Ambulatory Visit: Payer: Self-pay

## 2020-04-05 ENCOUNTER — Encounter: Payer: Self-pay | Admitting: Intensive Care

## 2020-04-05 DIAGNOSIS — S52572A Other intraarticular fracture of lower end of left radius, initial encounter for closed fracture: Secondary | ICD-10-CM | POA: Insufficient documentation

## 2020-04-05 DIAGNOSIS — S62109A Fracture of unspecified carpal bone, unspecified wrist, initial encounter for closed fracture: Secondary | ICD-10-CM

## 2020-04-05 DIAGNOSIS — Z79899 Other long term (current) drug therapy: Secondary | ICD-10-CM | POA: Diagnosis not present

## 2020-04-05 DIAGNOSIS — T148XXA Other injury of unspecified body region, initial encounter: Secondary | ICD-10-CM

## 2020-04-05 DIAGNOSIS — W010XXA Fall on same level from slipping, tripping and stumbling without subsequent striking against object, initial encounter: Secondary | ICD-10-CM | POA: Diagnosis not present

## 2020-04-05 DIAGNOSIS — S0083XA Contusion of other part of head, initial encounter: Secondary | ICD-10-CM | POA: Diagnosis not present

## 2020-04-05 DIAGNOSIS — S5290XA Unspecified fracture of unspecified forearm, initial encounter for closed fracture: Secondary | ICD-10-CM

## 2020-04-05 DIAGNOSIS — S6992XA Unspecified injury of left wrist, hand and finger(s), initial encounter: Secondary | ICD-10-CM | POA: Diagnosis present

## 2020-04-05 MED ORDER — OXYCODONE HCL 5 MG PO TABS: 2.5000 mg | ORAL_TABLET | Freq: Three times a day (TID) | ORAL | 0 refills | Status: DC | PRN

## 2020-04-05 MED ORDER — BUPIVACAINE HCL 0.5 % IJ SOLN
10.0000 mL | Freq: Once | INTRAMUSCULAR | Status: AC
  Administered 2020-04-05: 10 mL
  Filled 2020-04-05: qty 10

## 2020-04-05 MED ORDER — FENTANYL CITRATE (PF) 100 MCG/2ML IJ SOLN
25.0000 ug | Freq: Once | INTRAMUSCULAR | Status: AC
  Administered 2020-04-05: 25 ug via INTRAVENOUS
  Filled 2020-04-05: qty 2

## 2020-04-05 MED ORDER — ACETAMINOPHEN 500 MG PO TABS
1000.0000 mg | ORAL_TABLET | Freq: Once | ORAL | Status: AC
  Administered 2020-04-05: 1000 mg via ORAL
  Filled 2020-04-05: qty 2

## 2020-04-05 MED ORDER — ACETAMINOPHEN 325 MG PO TABS: 650.0000 mg | ORAL_TABLET | Freq: Four times a day (QID) | ORAL | 0 refills | Status: DC | PRN

## 2020-04-05 MED ORDER — LIDOCAINE HCL (PF) 1 % IJ SOLN
INTRAMUSCULAR | Status: AC
  Administered 2020-04-05: 5 mL
  Filled 2020-04-05: qty 5

## 2020-04-05 MED ORDER — ONDANSETRON 4 MG PO TBDP: 4.0000 mg | ORAL_TABLET | Freq: Three times a day (TID) | ORAL | 0 refills | Status: DC | PRN

## 2020-04-05 MED ORDER — LIDOCAINE HCL 2 % IJ SOLN
5.0000 mL | Freq: Once | INTRAMUSCULAR | Status: DC
  Filled 2020-04-05: qty 5

## 2020-04-05 NOTE — ED Notes (Signed)
Pt with CT. 

## 2020-04-05 NOTE — ED Notes (Signed)
Pt's left wrist placed in finger trap for traction by EDP. Pt denies pain at this time.

## 2020-04-05 NOTE — Discharge Instructions (Addendum)
Follow-up with Dr. Joice Lofts of Orthopedics early this coming week for your wrist  Elevate your wrist as often as possible, above the level of your heart  You can apply ice over the wrist splint for next 48 hours  I would hold your blood thinner tonight and restart tomorrow

## 2020-04-05 NOTE — ED Provider Notes (Signed)
[facility] Emergency Department Provider Note  ____________________________________________   Event Date/Time   First MD Initiated Contact with Patient 04/05/20 1525     (approximate)  I have reviewed the triage vital signs and the nursing notes.   HISTORY  Chief Complaint Fall    HPI Vanessa Logan is a 87 y.o. female with past medical history of A. fib on anticoagulation, hypertension, here with pain after fall.  The patient states that she was using her Rollator today when she accidentally lost balance, falling forward.  She struck her left forehead and braced herself with her left wrist.  She experienced immediate onset of moderate, aching, throbbing, left wrist pain and deformity.  No open wounds.  She did strike her left head although she did not notice this until the swelling had sudden.  No loss of consciousness.  No neck pain.  No upper or lower extremity numbness or weakness.  She was well prior to the incident.  No hip pain.  She has been able to ambulate since the episode.        Past Medical History:  Diagnosis Date  . Atrial fibrillation (HCC)   . Breast screening, unspecified 06/18/11  . Diffuse cystic mastopathy   . Family history of malignant neoplasm of breast 2013  . Obesity, unspecified 2013  . Screening for obesity 2013  . Special screening for malignant neoplasms, colon 06/21/2011  . Unspecified essential hypertension     Patient Active Problem List   Diagnosis Date Noted  . Acute UTI 07/04/2017  . Fibrocystic breast disease 06/29/2012  . Family history of malignant neoplasm of breast     Past Surgical History:  Procedure Laterality Date  . APPENDECTOMY  1950  . BREAST BIOPSY Bilateral 1992   neg  . BREAST BIOPSY Right 1996   neg  . BREAST BIOPSY Right 2005   neg  . COLONOSCOPY  2007  . COLONOSCOPY  2013   Dr Ricki RodriguezSkulski  . COLONOSCOPY W/ POLYPECTOMY  2004  . VARICOSE VEIN SURGERY  1983    Prior to Admission  medications   Medication Sig Start Date End Date Taking? Authorizing Provider  acetaminophen (TYLENOL) 325 MG tablet Take 2 tablets (650 mg total) by mouth every 6 (six) hours as needed for moderate pain. 04/05/20 04/05/21 Yes Shaune PollackIsaacs, Daisi Kentner, MD  ondansetron (ZOFRAN ODT) 4 MG disintegrating tablet Take 1 tablet (4 mg total) by mouth every 8 (eight) hours as needed for nausea or vomiting. 04/05/20  Yes Shaune PollackIsaacs, Yahshua Thibault, MD  oxyCODONE (ROXICODONE) 5 MG immediate release tablet Take 0.5 tablets (2.5 mg total) by mouth every 8 (eight) hours as needed for severe pain. 04/05/20 04/05/21 Yes Shaune PollackIsaacs, Chestina Komatsu, MD  apixaban (ELIQUIS) 5 MG TABS tablet Take 5 mg by mouth 2 (two) times daily.    [provider]  hydrochlorothiazide (HYDRODIURIL) 50 MG tablet Take 50 mg by mouth daily.    [provider]  losartan (COZAAR) 50 MG tablet Take 1 tablet by mouth daily. 08/26/14   [provider]  metoprolol succinate (TOPROL-XL) 25 MG 24 hr tablet Take 12.5 mg by mouth at bedtime.     [provider]  MYRBETRIQ 50 MG TB24 tablet Take 50 mg by mouth daily.    [provider]  nitrofurantoin, macrocrystal-monohydrate, (MACROBID) 100 MG capsule Take 1 capsule (100 mg total) by mouth every 12 (twelve) hours. 07/06/17   Enedina FinnerPatel, Sona, MD  oxybutynin (DITROPAN-XL) 10 MG 24 hr tablet Take 10 mg by  mouth 2 (two) times daily. 08/26/14   [provider]  potassium chloride SA (K-DUR,KLOR-CON) 20 MEQ tablet Take 40 mEq by mouth 2 (two) times daily.  03/05/14   [provider]    Allergies Neosporin [neomycin-bacitracin zn-polymyx] and Keflex [cephalexin]  Family History  Problem Relation Age of Onset  . Breast cancer Sister 18  . Pancreatic cancer Sister   . Colon cancer Daughter   . Breast cancer Sister 53    Social History Social History   Tobacco Use  . Smoking status: Never Smoker  . Smokeless tobacco: Never Used  Substance Use Topics  . Alcohol use: No     Alcohol/week: 0.0 standard drinks  . Drug use: No    Review of Systems  Review of Systems  Constitutional: Negative for fatigue and fever.  HENT: Positive for facial swelling. Negative for congestion and sore throat.   Eyes: Negative for visual disturbance.  Respiratory: Negative for cough and shortness of breath.   Cardiovascular: Negative for chest pain.  Gastrointestinal: Negative for abdominal pain, diarrhea, nausea and vomiting.  Genitourinary: Negative for flank pain.  Musculoskeletal: Positive for arthralgias and myalgias. Negative for back pain and neck pain.  Skin: Negative for rash and wound.  Neurological: Negative for weakness.  All other systems reviewed and are negative.    ____________________________________________  PHYSICAL EXAM:      VITAL SIGNS: ED Triage Vitals  Enc Vitals Group     BP 04/05/20 1402 122/79     Pulse Rate 04/05/20 1402 84     Resp 04/05/20 1402 16     Temp 04/05/20 1402 97.6 F (36.4 C)     Temp Source 04/05/20 1402 Oral     SpO2 04/05/20 1402 96 %     Weight 04/05/20 1403 162 lb (73.5 kg)     Height 04/05/20 1403 5\' 8"  (1.727 m)     Head Circumference --      Peak Flow --      Pain Score 04/05/20 1403 4     Pain Loc --      Pain Edu? --      Excl. in GC? --      Physical Exam Vitals and nursing note reviewed.  Constitutional:      General: She is not in acute distress.    Appearance: She is well-developed.  HENT:     Head: Normocephalic and atraumatic.     Comments: Large hematoma to the left brow.  Moderate surrounding ecchymoses.  No deformity.  No step-offs.  No facial instability Eyes:     Conjunctiva/sclera: Conjunctivae normal.  Cardiovascular:     Rate and Rhythm: Normal rate and regular rhythm.     Heart sounds: Normal heart sounds.  Pulmonary:     Effort: Pulmonary effort is normal. No respiratory distress.     Breath sounds: No wheezing.  Abdominal:     General: There is no distension.  Musculoskeletal:      Cervical back: Neck supple.  Skin:    General: Skin is warm.     Capillary Refill: Capillary refill takes less than 2 seconds.     Findings: No rash.  Neurological:     Mental Status: She is alert and oriented to person, place, and time.     Motor: No abnormal muscle tone.      UPPER EXTREMITY EXAM: LEFT  INSPECTION & PALPATION: Obvious deformity to L wrist with radial and ulnar deviation of hand. Moderate hematoma.   SENSORY:  Sensation is intact to light touch in:  Superficial radial nerve distribution (dorsal first web space) Median nerve distribution (tip of index finger)   Ulnar nerve distribution (tip of small finger)     MOTOR:  + Motor posterior interosseous nerve (thumb IP extension) + Anterior interosseous nerve (thumb IP flexion, index finger DIP flexion) + Radial nerve (wrist extension) + Median nerve (palpable firing thenar mass) + Ulnar nerve (palpable firing of first dorsal interosseous muscle)  VASCULAR: 2+ radial pulse Brisk capillary refill < 2 sec, fingers warm and well-perfused   ____________________________________________   LABS (all labs ordered are listed, but only abnormal results are displayed)  Labs Reviewed - No data to display  ____________________________________________  EKG: None ________________________________________  RADIOLOGY All imaging, including plain films, CT scans, and ultrasounds, independently reviewed by me, and interpretations confirmed via formal radiology reads.  ED MD interpretation:   XR Wrist Left:Distal radius/ulna fx CT Head/C Spine: No acute abnormality CT Face: Hemtoma, no FX  Official radiology report(s): DG Wrist 2 Views Left  Result Date: 04/05/2020 CLINICAL DATA:  Reduction of left wrist fracture EXAM: LEFT WRIST - 1 VIEW COMPARISON:  04/05/2020 at 6:22 p.m. FINDINGS: Single lateral view of the left wrist was obtained at the ordering physician's request. There has been significant reduction of the  distal left radial fracture, with no residual dorsal translation. Only minimal residual dorsal angulation measuring less than 10 degrees. Diffuse soft tissue edema. IMPRESSION: 1. Significant reduction of the distal left radial fracture, with only mild residual dorsal angulation at the fracture site. Electronically Signed   By: Sharlet Salina M.D.   On: 04/05/2020 18:46   DG Wrist 2 Views Left  Result Date: 04/05/2020 CLINICAL DATA:  Reduction of left wrist fracture EXAM: LEFT WRIST - 1  VIEW COMPARISON:  04/05/2020 FINDINGS: Single lateral view of the left wrist was obtained at the ordering physician's request. The dorsal displacement of the comminuted distal left radial fracture has been reduced, with slight residual dorsal angulation measuring approximately 24 degrees. Diffuse soft tissue edema. IMPRESSION: 1. Reduction of the distal left radial fracture, with decreased dorsal translation and residual dorsal angulation as above. Electronically Signed   By: Sharlet Salina M.D.   On: 04/05/2020 18:45   DG Wrist 2 Views Left  Result Date: 04/05/2020 CLINICAL DATA:  Post reduction, LEFT wrist in splint. EXAM: LEFT WRIST - 2 VIEW COMPARISON:  April 05, 2020 FINDINGS: Overlying casting material limits bony detail. The foreshortened distal radius due to impacted comminuted intra-articular fracture with similar appearance. Dorsal tilt of distal radial articular surface also similar in the setting of apex volar angulation at the fracture site. Posterior displacement of distal radius associated with dorsal tilt is similar accounting for differences in projection. IMPRESSION: Casting material overlying the distal radius limiting bony detail without gross change of a comminuted, angulated and impacted intra-articular fracture of the distal LEFT radius. There is still moderate displacement in addition to angulation of the distal radius. Electronically Signed   By: Donzetta Kohut M.D.   On: 04/05/2020 17:40   DG  Wrist Complete Left  Result Date: 04/05/2020 CLINICAL DATA:  Pain after fall EXAM: LEFT WRIST - COMPLETE 3+ VIEW COMPARISON:  None. FINDINGS: There is a comminuted angulated fracture of the distal radius which appears to extend in the radiocarpal joint. There is a fracture of the ulnar styloid. The carpal bones are normal. There are degenerative changes at the base of the first metacarpal. No other abnormalities. IMPRESSION: Comminuted  angulated fracture of the distal radius extending into the radiocarpal joint. Ulnar styloid fracture. Electronically Signed   By: Gerome Sam III M.D   On: 04/05/2020 14:48   CT Head Wo Contrast  Result Date: 04/05/2020 CLINICAL DATA:  Trip and fall, hematoma left forehead, left inner EXAM: CT HEAD WITHOUT CONTRAST CT MAXILLOFACIAL WITHOUT CONTRAST CT CERVICAL SPINE WITHOUT CONTRAST TECHNIQUE: Multidetector CT imaging of the head, cervical spine, and maxillofacial structures were performed using the standard protocol without intravenous contrast. Multiplanar CT image reconstructions of the cervical spine and maxillofacial structures were also generated. COMPARISON:  12/07/2017 FINDINGS: CT HEAD FINDINGS Brain: No evidence of acute infarction, hemorrhage, hydrocephalus, extra-axial collection or mass lesion/mass effect. Mild periventricular and deep white matter hypodensity. Vascular: No hyperdense vessel or unexpected calcification. CT FACIAL BONES FINDINGS Skull: Normal. Negative for fracture or focal lesion. Facial bones: No displaced fractures or dislocations. Sinuses/Orbits: No acute finding. Other: Soft tissue hematoma over left forehead (series 2, image 12). CT CERVICAL SPINE FINDINGS Alignment: Normal. Skull base and vertebrae: No acute fracture. No primary bone lesion or focal pathologic process. Soft tissues and spinal canal: No prevertebral fluid or swelling. No visible canal hematoma. Disc levels: Mild to moderate disc space height loss and osteophytosis of the  lower cervical spine. Upper chest: Negative. Other: None. IMPRESSION: 1. No acute intracranial pathology. Mild small-vessel white matter disease. 2. No displaced fracture or dislocation of the facial bones. 3. Soft tissue hematoma over left forehead. 4. No fracture or static subluxation of the cervical spine. Electronically Signed   By: Lauralyn Primes M.D.   On: 04/05/2020 16:26   CT Cervical Spine Wo Contrast  Result Date: 04/05/2020 CLINICAL DATA:  Trip and fall, hematoma left forehead, left inner EXAM: CT HEAD WITHOUT CONTRAST CT MAXILLOFACIAL WITHOUT CONTRAST CT CERVICAL SPINE WITHOUT CONTRAST TECHNIQUE: Multidetector CT imaging of the head, cervical spine, and maxillofacial structures were performed using the standard protocol without intravenous contrast. Multiplanar CT image reconstructions of the cervical spine and maxillofacial structures were also generated. COMPARISON:  12/07/2017 FINDINGS: CT HEAD FINDINGS Brain: No evidence of acute infarction, hemorrhage, hydrocephalus, extra-axial collection or mass lesion/mass effect. Mild periventricular and deep white matter hypodensity. Vascular: No hyperdense vessel or unexpected calcification. CT FACIAL BONES FINDINGS Skull: Normal. Negative for fracture or focal lesion. Facial bones: No displaced fractures or dislocations. Sinuses/Orbits: No acute finding. Other: Soft tissue hematoma over left forehead (series 2, image 12). CT CERVICAL SPINE FINDINGS Alignment: Normal. Skull base and vertebrae: No acute fracture. No primary bone lesion or focal pathologic process. Soft tissues and spinal canal: No prevertebral fluid or swelling. No visible canal hematoma. Disc levels: Mild to moderate disc space height loss and osteophytosis of the lower cervical spine. Upper chest: Negative. Other: None. IMPRESSION: 1. No acute intracranial pathology. Mild small-vessel white matter disease. 2. No displaced fracture or dislocation of the facial bones. 3. Soft tissue hematoma  over left forehead. 4. No fracture or static subluxation of the cervical spine. Electronically Signed   By: Lauralyn Primes M.D.   On: 04/05/2020 16:26   CT Maxillofacial Wo Contrast  Result Date: 04/05/2020 CLINICAL DATA:  Trip and fall, hematoma left forehead, left inner EXAM: CT HEAD WITHOUT CONTRAST CT MAXILLOFACIAL WITHOUT CONTRAST CT CERVICAL SPINE WITHOUT CONTRAST TECHNIQUE: Multidetector CT imaging of the head, cervical spine, and maxillofacial structures were performed using the standard protocol without intravenous contrast. Multiplanar CT image reconstructions of the cervical spine and maxillofacial structures were also generated. COMPARISON:  12/07/2017 FINDINGS:  CT HEAD FINDINGS Brain: No evidence of acute infarction, hemorrhage, hydrocephalus, extra-axial collection or mass lesion/mass effect. Mild periventricular and deep white matter hypodensity. Vascular: No hyperdense vessel or unexpected calcification. CT FACIAL BONES FINDINGS Skull: Normal. Negative for fracture or focal lesion. Facial bones: No displaced fractures or dislocations. Sinuses/Orbits: No acute finding. Other: Soft tissue hematoma over left forehead (series 2, image 12). CT CERVICAL SPINE FINDINGS Alignment: Normal. Skull base and vertebrae: No acute fracture. No primary bone lesion or focal pathologic process. Soft tissues and spinal canal: No prevertebral fluid or swelling. No visible canal hematoma. Disc levels: Mild to moderate disc space height loss and osteophytosis of the lower cervical spine. Upper chest: Negative. Other: None. IMPRESSION: 1. No acute intracranial pathology. Mild small-vessel white matter disease. 2. No displaced fracture or dislocation of the facial bones. 3. Soft tissue hematoma over left forehead. 4. No fracture or static subluxation of the cervical spine. Electronically Signed   By: Lauralyn Primes M.D.   On: 04/05/2020 16:26    ____________________________________________  PROCEDURES   Procedure(s)  performed (including Critical Care):  .Ortho Injury Treatment  Date/Time: 04/05/2020 8:30 PM Performed by: Shaune Pollack, MD Authorized by: Shaune Pollack, MD   Consent:    Consent obtained:  Verbal   Consent given by:  Patient   Risks discussed:  Fracture, irreducible dislocation, nerve damage, recurrent dislocation, restricted joint movement, stiffness and vascular damage   Alternatives discussed:  Alternative treatmentInjury location: Left wrist. Pre-procedure neurovascular assessment: neurovascularly intact Pre-procedure distal perfusion: normal Pre-procedure neurological function: normal Pre-procedure range of motion: reduced Anesthesia: hematoma block  Anesthesia: Local anesthesia used: yes Local Anesthetic: lidocaine 1% without epinephrine and bupivacaine 0.5% without epinephrine Anesthetic total: 10 mL  Patient sedated: NoImmobilization: splint Splint type: Sugar tong. Splint Applied by: ED Provider Supplies used: Ortho-Glass Post-procedure neurovascular assessment: post-procedure neurovascularly intact Post-procedure distal perfusion: normal Post-procedure neurological function: normal Post-procedure range of motion: normal Comments: Initial reduction performed after hematoma block with 10 pounds of traction.  Patient had persistent angulation.  Case was discussed with Dr. Dartha Lodge.  Following this, I manually reduced the fracture with significant improvement in alignment.  Pain improved.  Distal pulses and sensation intact after reduction.     ____________________________________________  INITIAL IMPRESSION / MDM / ASSESSMENT AND PLAN / ED COURSE  As part of my medical decision making, I reviewed the following data within the electronic MEDICAL RECORD NUMBER Nursing notes reviewed and incorporated, Old chart reviewed, Notes from prior ED visits, and [place] Controlled Substance Database       *RAMANI RIVA was evaluated in Emergency Department on 04/05/2020 for the  symptoms described in the history of present illness. She was evaluated in the context of the global COVID-19 pandemic, which necessitated consideration that the patient might be at risk for infection with the SARS-CoV-2 virus that causes COVID-19. Institutional protocols and algorithms that pertain to the evaluation of patients at risk for COVID-19 are in a state of rapid change based on information released by regulatory bodies including the CDC and federal and state organizations. These policies and algorithms were followed during the patient's care in the ED.  Some ED evaluations and interventions may be delayed as a result of limited staffing during the pandemic.*     Medical Decision Making: 87 year old female here with left wrist pain and hematoma of the forehead after mechanical fall.  She was well prior to the falls.  Regarding her forehead hematoma, CT scans reviewed and showed no acute intracranial  or facial abnormality.  She is alert and at her baseline.  Regarding her wrist, she has a displaced distal radius and ulnar fracture.  This was reduced after informed consent with performance of a hematoma block.  She had persistent angulation after a traction reduction, so I manually reduce this.  The case was discussed with Dr. Joice Lofts who will follow up with the patient in the next week.  Will give her a very brief course of analgesics and I discussed the risks and benefits of analgesia as well as having someone at home with her.  She will stay with her daughter for the next several days.  They are comfortable with this plan.  ____________________________________________  FINAL CLINICAL IMPRESSION(S) / ED DIAGNOSES  Final diagnoses:  Radius fracture  Wrist fracture  Facial hematoma, initial encounter     MEDICATIONS GIVEN DURING THIS VISIT:  Medications  lidocaine (XYLOCAINE) 2 % (with pres) injection 100 mg (100 mg Intradermal Not Given 04/05/20 1630)  bupivacaine (MARCAINE) 0.5 % (with  pres) injection 10 mL (10 mLs Infiltration Given by Other 04/05/20 1618)  fentaNYL (SUBLIMAZE) injection 25 mcg (25 mcg Intravenous Given 04/05/20 1618)  lidocaine (PF) (XYLOCAINE) 1 % injection (5 mLs  Given by Other 04/05/20 1618)  acetaminophen (TYLENOL) tablet 1,000 mg (1,000 mg Oral Given 04/05/20 1728)  fentaNYL (SUBLIMAZE) injection 25 mcg (25 mcg Intravenous Given 04/05/20 1812)     ED Discharge Orders         Ordered    oxyCODONE (ROXICODONE) 5 MG immediate release tablet  Every 8 hours PRN        04/05/20 1852    acetaminophen (TYLENOL) 325 MG tablet  Every 6 hours PRN        04/05/20 1852    ondansetron (ZOFRAN ODT) 4 MG disintegrating tablet  Every 8 hours PRN        04/05/20 1852           Note:  This document was prepared using Dragon voice recognition software and may include unintentional dictation errors.   Shaune Pollack, MD 04/05/20 2032

## 2020-04-05 NOTE — ED Notes (Addendum)
Splint applied to left wrist by EDP. Left arm elevated, sling provided.

## 2020-04-05 NOTE — ED Triage Notes (Signed)
Patient arrived by EMS from home where she lives alone. A&O x4 upon arrival. Reports tripping with her Rolator and falling. Hematoma noted to left forehead and left wrist trauma/pain. Takes blood thinner daily. Denies LOC

## 2020-04-05 NOTE — ED Notes (Signed)
Sensation decreased in left hand due to block, capillary refill <2 seconds, movement hindered by splint.

## 2020-04-05 NOTE — ED Notes (Addendum)
Splint reapplied, sling reapplied. Pt assisted to toilet.

## 2020-04-05 NOTE — ED Notes (Signed)
Ice applied to left eye

## 2020-04-11 ENCOUNTER — Other Ambulatory Visit: Payer: Self-pay | Admitting: Orthopedic Surgery

## 2020-04-11 ENCOUNTER — Other Ambulatory Visit
Admission: RE | Admit: 2020-04-11 | Discharge: 2020-04-11 | Disposition: A | Discharge status: Home / Self Care | Payer: Medicare Other | Source: Ambulatory Visit | Attending: Orthopedic Surgery | Admitting: Orthopedic Surgery

## 2020-04-11 ENCOUNTER — Other Ambulatory Visit: Payer: Self-pay

## 2020-04-11 DIAGNOSIS — Z20822 Contact with and (suspected) exposure to covid-19: Secondary | ICD-10-CM | POA: Insufficient documentation

## 2020-04-11 DIAGNOSIS — Z01812 Encounter for preprocedural laboratory examination: Secondary | ICD-10-CM | POA: Insufficient documentation

## 2020-04-12 LAB — SARS CORONAVIRUS 2 (TAT 6-24 HRS): SARS Coronavirus 2: NEGATIVE

## 2020-04-15 ENCOUNTER — Ambulatory Visit
Admission: RE | Admit: 2020-04-15 | Discharge: 2020-04-15 | Disposition: A | Discharge status: Home / Self Care | Payer: Medicare Other | Attending: Orthopedic Surgery | Admitting: Orthopedic Surgery

## 2020-04-15 ENCOUNTER — Ambulatory Visit: Payer: Medicare Other | Admitting: Anesthesiology

## 2020-04-15 ENCOUNTER — Other Ambulatory Visit: Payer: Self-pay

## 2020-04-15 ENCOUNTER — Ambulatory Visit: Payer: Medicare Other

## 2020-04-15 ENCOUNTER — Encounter
Admission: RE | Disposition: A | Discharge status: Home / Self Care | Payer: Self-pay | Source: Home / Self Care | Attending: Orthopedic Surgery

## 2020-04-15 ENCOUNTER — Encounter: Payer: Self-pay | Admitting: Orthopedic Surgery

## 2020-04-15 DIAGNOSIS — S52572A Other intraarticular fracture of lower end of left radius, initial encounter for closed fracture: Secondary | ICD-10-CM | POA: Insufficient documentation

## 2020-04-15 DIAGNOSIS — I10 Essential (primary) hypertension: Secondary | ICD-10-CM | POA: Insufficient documentation

## 2020-04-15 DIAGNOSIS — Z79899 Other long term (current) drug therapy: Secondary | ICD-10-CM | POA: Diagnosis not present

## 2020-04-15 DIAGNOSIS — Z881 Allergy status to other antibiotic agents status: Secondary | ICD-10-CM | POA: Diagnosis not present

## 2020-04-15 DIAGNOSIS — I480 Paroxysmal atrial fibrillation: Secondary | ICD-10-CM | POA: Diagnosis not present

## 2020-04-15 DIAGNOSIS — Z7901 Long term (current) use of anticoagulants: Secondary | ICD-10-CM | POA: Insufficient documentation

## 2020-04-15 DIAGNOSIS — Z8379 Family history of other diseases of the digestive system: Secondary | ICD-10-CM | POA: Insufficient documentation

## 2020-04-15 DIAGNOSIS — Z800 Family history of malignant neoplasm of digestive organs: Secondary | ICD-10-CM | POA: Insufficient documentation

## 2020-04-15 DIAGNOSIS — W1839XA Other fall on same level, initial encounter: Secondary | ICD-10-CM | POA: Insufficient documentation

## 2020-04-15 DIAGNOSIS — Z8781 Personal history of (healed) traumatic fracture: Secondary | ICD-10-CM

## 2020-04-15 DIAGNOSIS — Z803 Family history of malignant neoplasm of breast: Secondary | ICD-10-CM | POA: Insufficient documentation

## 2020-04-15 HISTORY — PX: ORIF WRIST FRACTURE: SHX2133

## 2020-04-15 SURGERY — OPEN REDUCTION INTERNAL FIXATION (ORIF) WRIST FRACTURE
Anesthesia: General | Site: Wrist | Laterality: Left

## 2020-04-15 MED ORDER — DEXAMETHASONE SODIUM PHOSPHATE 10 MG/ML IJ SOLN
INTRAMUSCULAR | Status: DC | PRN
  Administered 2020-04-15: 5 mg via INTRAVENOUS

## 2020-04-15 MED ORDER — LACTATED RINGERS IV SOLN: INTRAVENOUS | Status: DC

## 2020-04-15 MED ORDER — PROPOFOL 10 MG/ML IV BOLUS
INTRAVENOUS | Status: DC | PRN
  Administered 2020-04-15: 100 mg via INTRAVENOUS

## 2020-04-15 MED ORDER — LIDOCAINE HCL (CARDIAC) PF 100 MG/5ML IV SOSY
PREFILLED_SYRINGE | INTRAVENOUS | Status: DC | PRN
  Administered 2020-04-15: 80 mg via INTRAVENOUS

## 2020-04-15 MED ORDER — CHLORHEXIDINE GLUCONATE 0.12 % MT SOLN
OROMUCOSAL | Status: AC
  Administered 2020-04-15: 15 mL via OROMUCOSAL
  Filled 2020-04-15: qty 15

## 2020-04-15 MED ORDER — ORAL CARE MOUTH RINSE: 15.0000 mL | Freq: Once | OROMUCOSAL | Status: AC

## 2020-04-15 MED ORDER — FAMOTIDINE 20 MG PO TABS: 20.0000 mg | ORAL_TABLET | Freq: Once | ORAL | Status: AC

## 2020-04-15 MED ORDER — FENTANYL CITRATE (PF) 100 MCG/2ML IJ SOLN
INTRAMUSCULAR | Status: AC
  Filled 2020-04-15: qty 2

## 2020-04-15 MED ORDER — ONDANSETRON HCL 4 MG/2ML IJ SOLN
INTRAMUSCULAR | Status: AC
  Filled 2020-04-15: qty 2

## 2020-04-15 MED ORDER — DEXAMETHASONE SODIUM PHOSPHATE 10 MG/ML IJ SOLN
INTRAMUSCULAR | Status: AC
  Filled 2020-04-15: qty 1

## 2020-04-15 MED ORDER — FENTANYL CITRATE (PF) 100 MCG/2ML IJ SOLN
INTRAMUSCULAR | Status: DC | PRN
  Administered 2020-04-15 (×3): 25 ug via INTRAVENOUS

## 2020-04-15 MED ORDER — CEFAZOLIN SODIUM-DEXTROSE 2-4 GM/100ML-% IV SOLN
INTRAVENOUS | Status: AC
  Filled 2020-04-15: qty 100

## 2020-04-15 MED ORDER — GLYCOPYRROLATE 0.2 MG/ML IJ SOLN
INTRAMUSCULAR | Status: AC
  Filled 2020-04-15: qty 1

## 2020-04-15 MED ORDER — FENTANYL CITRATE (PF) 100 MCG/2ML IJ SOLN
25.0000 ug | INTRAMUSCULAR | Status: DC | PRN
  Administered 2020-04-15 (×4): 25 ug via INTRAVENOUS

## 2020-04-15 MED ORDER — ONDANSETRON HCL 4 MG/2ML IJ SOLN
4.0000 mg | Freq: Once | INTRAMUSCULAR | Status: AC | PRN
  Administered 2020-04-15: 4 mg via INTRAVENOUS

## 2020-04-15 MED ORDER — CHLORHEXIDINE GLUCONATE 0.12 % MT SOLN: 15.0000 mL | Freq: Once | OROMUCOSAL | Status: AC

## 2020-04-15 MED ORDER — FAMOTIDINE 20 MG PO TABS
ORAL_TABLET | ORAL | Status: AC
  Administered 2020-04-15: 20 mg via ORAL
  Filled 2020-04-15: qty 1

## 2020-04-15 MED ORDER — CEFAZOLIN SODIUM-DEXTROSE 2-4 GM/100ML-% IV SOLN
2.0000 g | INTRAVENOUS | Status: AC
  Administered 2020-04-15: 2 g via INTRAVENOUS

## 2020-04-15 MED ORDER — GLYCOPYRROLATE 0.2 MG/ML IJ SOLN
INTRAMUSCULAR | Status: DC | PRN
  Administered 2020-04-15: .1 mg via INTRAVENOUS

## 2020-04-15 MED ORDER — ACETAMINOPHEN 10 MG/ML IV SOLN
INTRAVENOUS | Status: DC | PRN
  Administered 2020-04-15: 1000 mg via INTRAVENOUS

## 2020-04-15 MED ORDER — DEXMEDETOMIDINE (PRECEDEX) IN NS 20 MCG/5ML (4 MCG/ML) IV SYRINGE
PREFILLED_SYRINGE | INTRAVENOUS | Status: DC | PRN
  Administered 2020-04-15 (×3): 4 ug via INTRAVENOUS

## 2020-04-15 MED ORDER — ACETAMINOPHEN 10 MG/ML IV SOLN
INTRAVENOUS | Status: AC
  Filled 2020-04-15: qty 100

## 2020-04-15 MED ORDER — PROPOFOL 10 MG/ML IV BOLUS
INTRAVENOUS | Status: AC
  Filled 2020-04-15: qty 20

## 2020-04-15 MED ORDER — ONDANSETRON HCL 4 MG/2ML IJ SOLN
INTRAMUSCULAR | Status: DC | PRN
  Administered 2020-04-15: 4 mg via INTRAVENOUS

## 2020-04-15 SURGICAL SUPPLY — 45 items
APL PRP STRL LF DISP 70% ISPRP (MISCELLANEOUS) ×1
BNDG CMPR STD VLCR NS LF 5.8X4 (GAUZE/BANDAGES/DRESSINGS) ×1
BNDG ELASTIC 4X5.8 VLCR NS LF (GAUZE/BANDAGES/DRESSINGS) ×2 IMPLANT
BNDG ELASTIC 4X5.8 VLCR STR LF (GAUZE/BANDAGES/DRESSINGS) ×2 IMPLANT
CANISTER SUCT 1200ML W/VALVE (MISCELLANEOUS) ×2 IMPLANT
CHLORAPREP W/TINT 26 (MISCELLANEOUS) ×2 IMPLANT
COVER WAND RF STERILE (DRAPES) ×2 IMPLANT
CUFF TOURN SGL QUICK 18X4 (TOURNIQUET CUFF) ×2 IMPLANT
DRAPE FLUOR MINI C-ARM 54X84 (DRAPES) ×2 IMPLANT
ELECT CAUTERY BLADE 6.4 (BLADE) ×2 IMPLANT
ELECT REM PT RETURN 9FT ADLT (ELECTROSURGICAL) ×2
ELECTRODE REM PT RTRN 9FT ADLT (ELECTROSURGICAL) ×1 IMPLANT
GAUZE SPONGE 4X4 12PLY STRL (GAUZE/BANDAGES/DRESSINGS) ×2 IMPLANT
GAUZE XEROFORM 1X8 LF (GAUZE/BANDAGES/DRESSINGS) ×2 IMPLANT
GLOVE SURG SYN 9.0  PF PI (GLOVE) ×1
GLOVE SURG SYN 9.0 PF PI (GLOVE) ×1 IMPLANT
GOWN SRG 2XL LVL 4 RGLN SLV (GOWNS) ×1 IMPLANT
GOWN STRL NON-REIN 2XL LVL4 (GOWNS) ×2
GOWN STRL REUS W/ TWL LRG LVL3 (GOWN DISPOSABLE) ×1 IMPLANT
GOWN STRL REUS W/TWL LRG LVL3 (GOWN DISPOSABLE) ×2
K-WIRE 1.6 (WIRE) ×2
K-WIRE FX5X1.6XNS BN SS (WIRE) ×1
KIT TURNOVER KIT A (KITS) ×2 IMPLANT
KWIRE FX5X1.6XNS BN SS (WIRE) ×1 IMPLANT
MANIFOLD NEPTUNE II (INSTRUMENTS) ×2 IMPLANT
NEEDLE FILTER BLUNT 18X 1/2SAF (NEEDLE) ×1
NEEDLE FILTER BLUNT 18X1 1/2 (NEEDLE) ×1 IMPLANT
NS IRRIG 500ML POUR BTL (IV SOLUTION) ×2 IMPLANT
PACK EXTREMITY ARMC (MISCELLANEOUS) ×2 IMPLANT
PAD CAST CTTN 4X4 STRL (SOFTGOODS) ×1 IMPLANT
PADDING CAST COTTON 4X4 STRL (SOFTGOODS) ×2
PEG SUBCHONDRAL SMOOTH 2.0X14 (Peg) ×2 IMPLANT
PEG SUBCHONDRAL SMOOTH 2.0X20 (Peg) ×8 IMPLANT
PEG SUBCHONDRAL SMOOTH 2.0X22 (Peg) ×2 IMPLANT
PLATE SHORT 21.6X48.9 NRRW LT (Plate) ×2 IMPLANT
SCALPEL PROTECTED #15 DISP (BLADE) ×4 IMPLANT
SCREW CORT 3.5X10 LNG (Screw) ×2 IMPLANT
SCREW CORT 3.5X14 LNG (Screw) ×2 IMPLANT
SCREW CORT 3.5X16 LNG (Screw) ×2 IMPLANT
SPLINT CAST 1 STEP 3X12 (MISCELLANEOUS) ×2 IMPLANT
SUT ETHILON 4-0 (SUTURE) ×2
SUT ETHILON 4-0 FS2 18XMFL BLK (SUTURE) ×1
SUT VICRYL 3-0 27IN (SUTURE) ×2 IMPLANT
SUTURE ETHLN 4-0 FS2 18XMF BLK (SUTURE) ×1 IMPLANT
SYR 3ML LL SCALE MARK (SYRINGE) ×2 IMPLANT

## 2020-04-15 NOTE — Transfer of Care (Signed)
Immediate Anesthesia Transfer of Care Note  Patient: Vanessa Logan  Procedure(s) Performed: OPEN REDUCTION INTERNAL FIXATION LEFT DISTAL RADIUS FRACTION (Left Wrist)  Patient Location: PACU  Anesthesia Type:General  Level of Consciousness: drowsy and patient cooperative  Airway & Oxygen Therapy: Patient Spontanous Breathing and Patient connected to face mask oxygen  Post-op Assessment: Report given to RN and Post -op Vital signs reviewed and stable  Post vital signs: Reviewed and stable  Last Vitals:  Vitals Value Taken Time  BP 98/58 04/15/20 1704  Temp    Pulse 62 04/15/20 1706  Resp 12 04/15/20 1706  SpO2 100 % 04/15/20 1706  Vitals shown include unvalidated device data.  Last Pain:  Vitals:   04/15/20 1400  TempSrc: Temporal  PainSc: 0-No pain         Complications: No complications documented.

## 2020-04-15 NOTE — Op Note (Signed)
04/15/2020  4:57 PM  PATIENT:  Vanessa Logan  87 y.o. female  PRE-OPERATIVE DIAGNOSIS:  Closed fracture of left wrist, initial encounter S62.102A  POST-OPERATIVE DIAGNOSIS:  Closed fracture of left wrist, initial encounter S62.102A  PROCEDURE:  Procedure(s): OPEN REDUCTION INTERNAL FIXATION LEFT DISTAL RADIUS FRACTION (Left) Intra-articular with 2 distal fragments  SURGEON: Leitha Schuller, MD  ASSISTANTS: None  ANESTHESIA:   general  EBL:  Total I/O In: 400 [I.V.:200; IV Piggyback:200] Out: 5 [Blood:5]  BLOOD ADMINISTERED:none  DRAINS: none   LOCAL MEDICATIONS USED:  NONE  SPECIMEN:  No Specimen  DISPOSITION OF SPECIMEN:  N/A  COUNTS:  YES  TOURNIQUET:   Total Tourniquet Time Documented: Upper Arm (Left) - 18 minutes Total: Upper Arm (Left) - 18 minutes   IMPLANTS: [employer] short narrow left DVR plate with multiple smooth pegs and screws  DICTATION: .Dragon Dictation patient is brought to the operating room and after adequate anesthesia was obtained the left arm was prepped and draped in the usual sterile fashion.  After patient identification and timeout procedures were completed tourniquet was raised and fingertrap traction applied off the end of the table.  Volar approach was utilized centered over the FCR tendon with the tendon exposed the tendon sheath was incised and the tendon retracted radially to protect the radial artery and associated veins.  Deep fascia was then incised and pronator exposed and elevated off the radial border.  The fracture site could be exposed at this time and with a Therapist, nutritional some of the early callus was taken down when with the traction applied near complete anatomic reduction applied with just some dorsal tilt.  Because this a distal first technique was utilized with the plate applied and held in position with a K wire as the smooth peg were filled you with standard technique drilling measuring and placing the smooth pegs  after all 6 pegs were in place and the K wire removed the plate was brought down to the shaft this restored length volar radial inclination and some of the volar tilt.  3 cortical screws were used and with traction removed under fluoroscopic views the fracture was stable without any intra-articular hardware.  The wound was then irrigated and tourniquet let down.  The wound was closed with 3-0 Vicryl subcutaneously and 4-0 nylon in a simple erupted fashion for the skin.  Xeroform 4 x 4 web roll volar splint and Ace wrap applied  PLAN OF CARE: Discharge to home after PACU  PATIENT DISPOSITION:  PACU - hemodynamically stable.

## 2020-04-15 NOTE — Anesthesia Procedure Notes (Signed)
Procedure Name: LMA Insertion Date/Time: 04/15/2020 4:09 PM Performed by: Elmarie Mainland, CRNA Pre-anesthesia Checklist: Patient identified, Emergency Drugs available, Suction available and Patient being monitored Patient Re-evaluated:Patient Re-evaluated prior to induction Oxygen Delivery Method: Circle system utilized Preoxygenation: Pre-oxygenation with 100% oxygen Induction Type: IV induction Ventilation: Mask ventilation without difficulty LMA: LMA inserted LMA Size: 4.0 Number of attempts: 1 Placement Confirmation: positive ETCO2 and breath sounds checked- equal and bilateral Tube secured with: Tape Dental Injury: Teeth and Oropharynx as per pre-operative assessment

## 2020-04-15 NOTE — Discharge Instructions (Addendum)
Keep arm elevated is much as possible. Ice to the back of the wrist today and tomorrow should help with swelling and pain. Work on finger motion is much as you can. Pain medicine as previously indicated.  If you need additional pain medication call our office [phone] Call office if you are having any problems or concerns.  AMBULATORY SURGERY  DISCHARGE INSTRUCTIONS   1) The drugs that you were given will stay in your system until tomorrow so for the next 24 hours you should not:  A) Drive an automobile B) Make any legal decisions C) Drink any alcoholic beverage   2) You may resume regular meals tomorrow.  Today it is better to start with liquids and gradually work up to solid foods.  You may eat anything you prefer, but it is better to start with liquids, then soup and crackers, and gradually work up to solid foods.   3) Please notify your doctor immediately if you have any unusual bleeding, trouble breathing, redness and pain at the surgery site, drainage, fever, or pain not relieved by medication.    4) Additional Instructions:    Please contact your physician with any problems or Same Day Surgery at [phone], Monday through Friday 6 am to 4 pm, or [place] at [facility] number at [phone].

## 2020-04-15 NOTE — H&P (Signed)
Chief Complaint  Patient presents with  . Wrist Fracture  Larey Seat on 04/05/2020   Vanessa Logan is a 87 y.o. female who presents today for evaluation of a left wrist injury sustained on 04/05/2020. The patient was using her Rollator when she thought that she had locked it however it was unlocked and she went to push forward and she fell forward hitting both her face and she reached out with her left hand into the catch her self and landed with the weight onto her left wrist. The patient felt a pop and had immediate pain in left upper extremity. The patient went to emergency room, at the emergency room x-rays were obtained of the left wrist which demonstrate a displaced left distal radius fracture with dorsal angulation. The patient underwent 2 separate closed reductions, the second close reduction did provide a significant improvement in the overall angulation and deformity. The patient also did undergo a CT of her head which did not demonstrate any acute intracranial pathology. The patient also underwent a CT of the maxillofacial region which did not demonstrate any displaced fracture, it did demonstrate a soft tissue hematoma over the left forehead. The patient was placed in a sugar tong splint and instructed to follow-up orthopedics. At today's visit the patient denies any repeat trauma or injury affecting the left wrist. She is taking Tylenol as needed for discomfort at home. She denies any numbness or tingling to the left upper extremity. The patient denies any personal history of asthma or COPD. She does have a history of A. fib and she does take Eliquis, she is already taken her doses for today. She denies any history of stroke. The patient prior to her fall was living independently at home, she is currently living with her daughter who is helping her. The patient is right-hand dominant, denies any previous injury or trauma affecting left wrist. She does do all activities on her own at home prior to the  injury.  Past Medical History: Past Medical History:  Diagnosis Date  . Anemia  . Chest pain  . Fibrocystic breast disease  . History of hypokalemia  . Hyperlipidemia  . Hypertension  . Incomplete right bundle branch block  . Obesity  . Osteoarthritis  . Paroxysmal atrial fibrillation (CMS-HCC)  . Peripheral edema  Venostasis  . Syncope   Past Surgical History: Past Surgical History:  Procedure Laterality Date  . APPENDECTOMY  . Bilateral breast biopsies  benign  . Bilateral vein stripping   Past Family History: Family History  Problem Relation Age of Onset  . Colon cancer Daughter  . Gallbladder disease Daughter  Cholecystectomy  . Gallbladder disease Sister  Cholecystectomy  . Breast cancer Sister  . Breast cancer Sister   Medications: Current Outpatient Medications Ordered in Epic  Medication Sig Dispense Refill  . acetaminophen (TYLENOL) 500 MG tablet Take 500 mg by mouth as needed for Pain  . apixaban (ELIQUIS) 5 mg tablet Take 1 tablet (5 mg total) by mouth every 12 (twelve) hours 60 tablet 11  . FUROsemide (LASIX) 20 MG tablet Take 1 tablet (20 mg total) by mouth once daily as needed 30 tablet 3  . hydroCHLOROthiazide (HYDRODIURIL) 50 MG tablet Take 1 tablet (50 mg total) by mouth once daily 90 tablet 3  . losartan (COZAAR) 50 MG tablet Take 1 tablet (50 mg total) by mouth once daily 90 tablet 3  . metoprolol succinate (TOPROL-XL) 25 MG XL tablet Take 1 tablet (25 mg total) by mouth once  daily 90 tablet 3  . oxybutynin (DITROPAN-XL) 10 MG XL tablet TAKE 1 TABLET(10 MG) BY MOUTH TWICE DAILY 180 tablet 0  . potassium chloride (K-TAB) 20 mEq TbER ER tablet Take 1 tablet (20 mEq total) by mouth 4 (four) times daily 360 tablet 1   No current Epic-ordered facility-administered medications on file.   Allergies: Allergies  Allergen Reactions  . Neomycin-Bacitracin-Polymyxin Itching and Swelling  . Neomycin-Polymyxin Rash  . Keflex [Cephalexin] Unknown and Rash     Review of Systems:  A comprehensive 14 point ROS was performed, reviewed by me today, and the pertinent orthopaedic findings are documented in the HPI.  Exam: BP (!) 142/86  Ht 172.7 cm (5\' 8" )  Wt 73.9 kg (163 lb)  BMI 24.78 kg/m  General/Constitutional: The patient appears to be well-nourished, well-developed, and in no acute distress. Neuro/Psych: Normal mood and affect, oriented to person, place and time. Eyes: Non-icteric. Pupils are equal, round, and reactive to light, and exhibit synchronous movement. ENT: Unremarkable. Lymphatic: No palpable adenopathy. Respiratory: Lungs clear to auscultation, Normal chest excursion, No wheezes and Non-labored breathing Cardiovascular: Regular rate and rhythm. No murmurs. and No edema, swelling or tenderness, except as noted in detailed exam. Integumentary: No impressive skin lesions present, except as noted in detailed exam. Musculoskeletal: Unremarkable, except as noted in detailed exam.  The patient presents today in a wheelchair, the patient is wearing a sling to left upper extremity and the sugar tong splint is intact to the left arm. The sugar tong splint is in good condition without evidence of loosening. The patient does have moderate ecchymosis and swelling to the left fingers, the patient is able to gently flex and extend all digits without discomfort. She is intact light touch over the dorsal and volar aspect of each individual finger. The splint was loosened and skin examination performed, there is moderate ecchymosis on the dorsal aspect of the wrist but no open wound or blister formation. Padding was applied and splint was reapplied to left upper extremity. Cap refills intact each individual finger.  Imaging: AP, lateral and oblique images of the left wrist were obtained today in the office and reviewed by me. These x-rays demonstrate evidence of a displaced left distal radius fracture with significant step-off and dorsal  angulation. The close reduction which was performed in the emergency room has fallen off the distal aspect of the radius. No other acute fractures visualized. No lytic lesions noted. Moderate arthritic changes are noted throughout the left hand.  Impression: Closed Colles' fracture of left radius, initial encounter [S52.532A] Closed Colles' fracture of left radius, initial encounter (primary encounter diagnosis)  Plan:  1. Treatment options were discussed today with the patient. 2. Instructed the patient and daughter that x-rays at today's visit demonstrate that the closed reduction has failed at this time. 3. Discussed the risk and benefits of surgical versus nonsurgical intervention. The patient lives at home and does care for herself. 4. After discussing the risk and benefits, the patient would like to proceed with a open reduction internal fixation of left distal radius fracture. Surgery will be performed by Dr. next week. 5. Plan for surgery on Tuesday, instructed the patient to take her daily dose of Eliquis today, but not to take it tomorrow. The patient may need to follow-up with cardiology for cardiac clearance. 6. this document will serve as a surgical history and physical for the patient. 7. The patient will follow-up per standard postop protocol. They can call the clinic they  have any questions, new symptoms develop or symptoms worsen.  The procedure was discussed with the patient, as were the potential risks (including bleeding, infection, nerve and/or blood vessel injury, persistent or recurrent pain, hardware irritation, failure of the reduction, progression of arthritis, need for further surgery, blood clots, strokes, heart attacks and/or arhythmias, pneumonia, etc.) and benefits. The patient states her understanding and wishes to proceed.  This office visit took 45 minutes, of which >50% involved patient counseling/education.  Review of the [place] CSRS was performed in accordance  of the [facility] prior to dispensing any controlled drugs.  This note was generated in part with voice recognition software and I apologize for any typographical errors that were not detected and corrected.  Valeria Batman, PA-C [facility]   Electronically signed by Anson [place], PA at 04/11/2020 12:34 PM EDT   Reviewed  H+P. No changes noted.

## 2020-04-15 NOTE — Anesthesia Preprocedure Evaluation (Signed)
Anesthesia Evaluation  Patient identified by MRN, date of birth, ID band Patient awake    Reviewed: Allergy & Precautions, NPO status , Patient's Chart, lab work & pertinent test results  History of Anesthesia Complications Negative for: history of anesthetic complications  Airway Mallampati: II  TM Distance: >3 FB Neck ROM: Full    Dental  (+) Poor Dentition, Implants   Pulmonary neg pulmonary ROS, neg sleep apnea, neg COPD,    breath sounds clear to auscultation- rhonchi (-) wheezing      Cardiovascular hypertension, Pt. on medications (-) CAD, (-) Past MI, (-) Cardiac Stents and (-) CABG + dysrhythmias Atrial Fibrillation  Rhythm:Regular Rate:Normal - Systolic murmurs and - Diastolic murmurs    Neuro/Psych neg Seizures negative neurological ROS  negative psych ROS   GI/Hepatic negative GI ROS, Neg liver ROS,   Endo/Other  negative endocrine ROSneg diabetes  Renal/GU negative Renal ROS     Musculoskeletal negative musculoskeletal ROS (+)   Abdominal (+) - obese,   Peds  Hematology negative hematology ROS (+)   Anesthesia Other Findings Past Medical History: No date: Atrial fibrillation (HCC) 06/18/11: Breast screening, unspecified No date: Diffuse cystic mastopathy 2013: Family history of malignant neoplasm of breast 2013: Obesity, unspecified 2013: Screening for obesity 06/21/2011: Special screening for malignant neoplasms, colon No date: Unspecified essential hypertension   Reproductive/Obstetrics                             Anesthesia Physical Anesthesia Plan  ASA: III  Anesthesia Plan: General   Post-op Pain Management:    Induction: Intravenous  PONV Risk Score and Plan: 2 and Ondansetron and Dexamethasone  Airway Management Planned: LMA  Additional Equipment:   Intra-op Plan:   Post-operative Plan:   Informed Consent: I have reviewed the patients History and  Physical, chart, labs and discussed the procedure including the risks, benefits and alternatives for the proposed anesthesia with the patient or authorized representative who has indicated his/her understanding and acceptance.     Dental advisory given  Plan Discussed with: CRNA and Anesthesiologist  Anesthesia Plan Comments:         Anesthesia Quick Evaluation

## 2020-04-16 ENCOUNTER — Encounter: Payer: Self-pay | Admitting: Orthopedic Surgery

## 2020-04-16 NOTE — Anesthesia Postprocedure Evaluation (Signed)
Anesthesia Post Note  Patient: Vanessa Logan  Procedure(s) Performed: OPEN REDUCTION INTERNAL FIXATION LEFT DISTAL RADIUS FRACTION (Left Wrist)  Patient location during evaluation: PACU Anesthesia Type: General Level of consciousness: awake and alert Pain management: pain level controlled Vital Signs Assessment: post-procedure vital signs reviewed and stable Respiratory status: spontaneous breathing, nonlabored ventilation, respiratory function stable and patient connected to nasal cannula oxygen Cardiovascular status: blood pressure returned to baseline and stable Postop Assessment: no apparent nausea or vomiting Anesthetic complications: no   No complications documented.   Last Vitals:  Vitals:   04/15/20 1743 04/15/20 1803  BP:  127/80  Pulse: 76 73  Resp: 16 14  Temp:  36.7 C  SpO2: 99% 98%    Last Pain:  Vitals:   04/15/20 1803  TempSrc:   PainSc: 3                  Cleda Mccreedy [place] Firkus

## 2020-12-04 ENCOUNTER — Other Ambulatory Visit: Payer: Self-pay | Admitting: Internal Medicine

## 2020-12-04 DIAGNOSIS — R410 Disorientation, unspecified: Secondary | ICD-10-CM

## 2020-12-04 DIAGNOSIS — I10 Essential (primary) hypertension: Secondary | ICD-10-CM

## 2020-12-25 ENCOUNTER — Ambulatory Visit
Admission: RE | Admit: 2020-12-25 | Discharge: 2020-12-25 | Disposition: A | Discharge status: Home / Self Care | Payer: Medicare Other | Source: Ambulatory Visit | Attending: Internal Medicine | Admitting: Internal Medicine

## 2020-12-25 ENCOUNTER — Other Ambulatory Visit: Payer: Self-pay

## 2020-12-25 DIAGNOSIS — R410 Disorientation, unspecified: Secondary | ICD-10-CM | POA: Insufficient documentation

## 2020-12-25 DIAGNOSIS — I10 Essential (primary) hypertension: Secondary | ICD-10-CM | POA: Insufficient documentation

## 2021-02-03 ENCOUNTER — Inpatient Hospital Stay
Admission: EM | Admit: 2021-02-03 | Discharge: 2021-02-12 | DRG: 871 | Disposition: A | Discharge status: Skilled Nursing Facility | Payer: Medicare Other | Attending: Internal Medicine | Admitting: Internal Medicine

## 2021-02-03 ENCOUNTER — Emergency Department: Payer: Medicare Other

## 2021-02-03 ENCOUNTER — Other Ambulatory Visit: Payer: Self-pay

## 2021-02-03 DIAGNOSIS — I4891 Unspecified atrial fibrillation: Secondary | ICD-10-CM

## 2021-02-03 DIAGNOSIS — E871 Hypo-osmolality and hyponatremia: Secondary | ICD-10-CM | POA: Diagnosis present

## 2021-02-03 DIAGNOSIS — Y92009 Unspecified place in unspecified non-institutional (private) residence as the place of occurrence of the external cause: Secondary | ICD-10-CM

## 2021-02-03 DIAGNOSIS — Z79899 Other long term (current) drug therapy: Secondary | ICD-10-CM

## 2021-02-03 DIAGNOSIS — Z888 Allergy status to other drugs, medicaments and biological substances status: Secondary | ICD-10-CM

## 2021-02-03 DIAGNOSIS — A419 Sepsis, unspecified organism: Secondary | ICD-10-CM

## 2021-02-03 DIAGNOSIS — J9601 Acute respiratory failure with hypoxia: Secondary | ICD-10-CM | POA: Diagnosis present

## 2021-02-03 DIAGNOSIS — J189 Pneumonia, unspecified organism: Secondary | ICD-10-CM | POA: Diagnosis present

## 2021-02-03 DIAGNOSIS — M8580 Other specified disorders of bone density and structure, unspecified site: Secondary | ICD-10-CM | POA: Diagnosis present

## 2021-02-03 DIAGNOSIS — W1811XA Fall from or off toilet without subsequent striking against object, initial encounter: Secondary | ICD-10-CM | POA: Diagnosis present

## 2021-02-03 DIAGNOSIS — Z9049 Acquired absence of other specified parts of digestive tract: Secondary | ICD-10-CM

## 2021-02-03 DIAGNOSIS — Z20822 Contact with and (suspected) exposure to covid-19: Secondary | ICD-10-CM | POA: Diagnosis present

## 2021-02-03 DIAGNOSIS — I5043 Acute on chronic combined systolic (congestive) and diastolic (congestive) heart failure: Secondary | ICD-10-CM | POA: Diagnosis present

## 2021-02-03 DIAGNOSIS — N179 Acute kidney failure, unspecified: Secondary | ICD-10-CM | POA: Diagnosis present

## 2021-02-03 DIAGNOSIS — E785 Hyperlipidemia, unspecified: Secondary | ICD-10-CM | POA: Diagnosis present

## 2021-02-03 DIAGNOSIS — W19XXXA Unspecified fall, initial encounter: Secondary | ICD-10-CM

## 2021-02-03 DIAGNOSIS — R6520 Severe sepsis without septic shock: Secondary | ICD-10-CM | POA: Diagnosis present

## 2021-02-03 DIAGNOSIS — A4151 Sepsis due to Escherichia coli [E. coli]: Principal | ICD-10-CM | POA: Diagnosis present

## 2021-02-03 DIAGNOSIS — E872 Acidosis, unspecified: Secondary | ICD-10-CM

## 2021-02-03 DIAGNOSIS — E876 Hypokalemia: Secondary | ICD-10-CM | POA: Diagnosis not present

## 2021-02-03 DIAGNOSIS — F03A0 Unspecified dementia, mild, without behavioral disturbance, psychotic disturbance, mood disturbance, and anxiety: Secondary | ICD-10-CM | POA: Diagnosis present

## 2021-02-03 DIAGNOSIS — J9600 Acute respiratory failure, unspecified whether with hypoxia or hypercapnia: Secondary | ICD-10-CM

## 2021-02-03 DIAGNOSIS — Z86010 Personal history of colonic polyps: Secondary | ICD-10-CM

## 2021-02-03 DIAGNOSIS — Z7901 Long term (current) use of anticoagulants: Secondary | ICD-10-CM

## 2021-02-03 DIAGNOSIS — I110 Hypertensive heart disease with heart failure: Secondary | ICD-10-CM | POA: Diagnosis present

## 2021-02-03 DIAGNOSIS — I959 Hypotension, unspecified: Secondary | ICD-10-CM | POA: Diagnosis present

## 2021-02-03 DIAGNOSIS — E860 Dehydration: Secondary | ICD-10-CM | POA: Diagnosis present

## 2021-02-03 DIAGNOSIS — R791 Abnormal coagulation profile: Secondary | ICD-10-CM | POA: Diagnosis present

## 2021-02-03 DIAGNOSIS — I480 Paroxysmal atrial fibrillation: Secondary | ICD-10-CM | POA: Diagnosis present

## 2021-02-03 DIAGNOSIS — R7881 Bacteremia: Secondary | ICD-10-CM | POA: Diagnosis present

## 2021-02-03 DIAGNOSIS — K921 Melena: Secondary | ICD-10-CM | POA: Diagnosis not present

## 2021-02-03 DIAGNOSIS — E875 Hyperkalemia: Secondary | ICD-10-CM | POA: Diagnosis present

## 2021-02-03 DIAGNOSIS — F0390 Unspecified dementia without behavioral disturbance: Secondary | ICD-10-CM

## 2021-02-03 DIAGNOSIS — R0602 Shortness of breath: Secondary | ICD-10-CM

## 2021-02-03 DIAGNOSIS — M25551 Pain in right hip: Secondary | ICD-10-CM | POA: Diagnosis present

## 2021-02-03 DIAGNOSIS — M25561 Pain in right knee: Secondary | ICD-10-CM | POA: Diagnosis not present

## 2021-02-03 LAB — CBC
HCT: 37 % (ref 36.0–46.0)
Hemoglobin: 11.9 g/dL — ABNORMAL LOW (ref 12.0–15.0)
MCH: 27.1 pg (ref 26.0–34.0)
MCHC: 32.2 g/dL (ref 30.0–36.0)
MCV: 84.3 fL (ref 80.0–100.0)
Platelets: 176 10*3/uL (ref 150–400)
RBC: 4.39 MIL/uL (ref 3.87–5.11)
RDW: 14.1 % (ref 11.5–15.5)
WBC: 17.3 10*3/uL — ABNORMAL HIGH (ref 4.0–10.5)
nRBC: 0 % (ref 0.0–0.2)

## 2021-02-03 MED ORDER — MAGNESIUM SULFATE 2 GM/50ML IV SOLN
2.0000 g | Freq: Once | INTRAVENOUS | Status: AC
  Administered 2021-02-04: 2 g via INTRAVENOUS
  Filled 2021-02-03: qty 50

## 2021-02-03 MED ORDER — LACTATED RINGERS IV BOLUS: 500.0000 mL | Freq: Once | INTRAVENOUS | Status: DC

## 2021-02-03 MED ORDER — LACTATED RINGERS IV BOLUS: 1000.0000 mL | Freq: Once | INTRAVENOUS | Status: DC

## 2021-02-03 NOTE — ED Triage Notes (Signed)
Pt arrived via [facility] from home, pt states she has had frequent falls at home and lives alone, pt states she uses a rolling walker, states on Sunday she was up going to the bathroom, pt states she got up in the dark and fell, pt states her legs gave out.  Pt states she has hit her head, denies any neck pain at this time.   PT denies any dizziness, denies any LOC.  Pt is alert and oriented x 4.  Pt takes Eliquis for afib.

## 2021-02-03 NOTE — ED Triage Notes (Signed)
Pt arrived via [facility] from home with reports of multiple falls, last fell 2 days ago, c/o pain. Pt currently on eliquis, hx of afib. Pt lives home alone.   P-76 97% 102/56

## 2021-02-04 ENCOUNTER — Emergency Department: Payer: Medicare Other

## 2021-02-04 ENCOUNTER — Encounter: Payer: Self-pay | Admitting: Radiology

## 2021-02-04 ENCOUNTER — Inpatient Hospital Stay: Payer: Medicare Other

## 2021-02-04 DIAGNOSIS — R6520 Severe sepsis without septic shock: Secondary | ICD-10-CM | POA: Diagnosis present

## 2021-02-04 DIAGNOSIS — E871 Hypo-osmolality and hyponatremia: Secondary | ICD-10-CM | POA: Diagnosis present

## 2021-02-04 DIAGNOSIS — M25551 Pain in right hip: Secondary | ICD-10-CM

## 2021-02-04 DIAGNOSIS — Z7901 Long term (current) use of anticoagulants: Secondary | ICD-10-CM | POA: Diagnosis not present

## 2021-02-04 DIAGNOSIS — A419 Sepsis, unspecified organism: Secondary | ICD-10-CM | POA: Diagnosis present

## 2021-02-04 DIAGNOSIS — M8580 Other specified disorders of bone density and structure, unspecified site: Secondary | ICD-10-CM | POA: Diagnosis present

## 2021-02-04 DIAGNOSIS — E860 Dehydration: Secondary | ICD-10-CM | POA: Diagnosis present

## 2021-02-04 DIAGNOSIS — J189 Pneumonia, unspecified organism: Secondary | ICD-10-CM | POA: Diagnosis present

## 2021-02-04 DIAGNOSIS — A4151 Sepsis due to Escherichia coli [E. coli]: Secondary | ICD-10-CM | POA: Diagnosis present

## 2021-02-04 DIAGNOSIS — N179 Acute kidney failure, unspecified: Secondary | ICD-10-CM

## 2021-02-04 DIAGNOSIS — Z9049 Acquired absence of other specified parts of digestive tract: Secondary | ICD-10-CM | POA: Diagnosis not present

## 2021-02-04 DIAGNOSIS — Z86010 Personal history of colonic polyps: Secondary | ICD-10-CM | POA: Diagnosis not present

## 2021-02-04 DIAGNOSIS — F0390 Unspecified dementia without behavioral disturbance: Secondary | ICD-10-CM | POA: Diagnosis not present

## 2021-02-04 DIAGNOSIS — I4891 Unspecified atrial fibrillation: Secondary | ICD-10-CM

## 2021-02-04 DIAGNOSIS — J9601 Acute respiratory failure with hypoxia: Secondary | ICD-10-CM

## 2021-02-04 DIAGNOSIS — E875 Hyperkalemia: Secondary | ICD-10-CM

## 2021-02-04 DIAGNOSIS — W1811XA Fall from or off toilet without subsequent striking against object, initial encounter: Secondary | ICD-10-CM | POA: Diagnosis present

## 2021-02-04 DIAGNOSIS — F03A0 Unspecified dementia, mild, without behavioral disturbance, psychotic disturbance, mood disturbance, and anxiety: Secondary | ICD-10-CM | POA: Diagnosis present

## 2021-02-04 DIAGNOSIS — Z20822 Contact with and (suspected) exposure to covid-19: Secondary | ICD-10-CM | POA: Diagnosis present

## 2021-02-04 DIAGNOSIS — I110 Hypertensive heart disease with heart failure: Secondary | ICD-10-CM | POA: Diagnosis present

## 2021-02-04 DIAGNOSIS — E785 Hyperlipidemia, unspecified: Secondary | ICD-10-CM | POA: Diagnosis present

## 2021-02-04 DIAGNOSIS — M25561 Pain in right knee: Secondary | ICD-10-CM | POA: Diagnosis present

## 2021-02-04 DIAGNOSIS — Z79899 Other long term (current) drug therapy: Secondary | ICD-10-CM | POA: Diagnosis not present

## 2021-02-04 DIAGNOSIS — R791 Abnormal coagulation profile: Secondary | ICD-10-CM | POA: Diagnosis present

## 2021-02-04 DIAGNOSIS — Z888 Allergy status to other drugs, medicaments and biological substances status: Secondary | ICD-10-CM | POA: Diagnosis not present

## 2021-02-04 DIAGNOSIS — E872 Acidosis, unspecified: Secondary | ICD-10-CM

## 2021-02-04 DIAGNOSIS — J9600 Acute respiratory failure, unspecified whether with hypoxia or hypercapnia: Secondary | ICD-10-CM

## 2021-02-04 DIAGNOSIS — I480 Paroxysmal atrial fibrillation: Secondary | ICD-10-CM | POA: Diagnosis present

## 2021-02-04 DIAGNOSIS — I5043 Acute on chronic combined systolic (congestive) and diastolic (congestive) heart failure: Secondary | ICD-10-CM | POA: Diagnosis present

## 2021-02-04 DIAGNOSIS — K921 Melena: Secondary | ICD-10-CM | POA: Diagnosis not present

## 2021-02-04 DIAGNOSIS — W19XXXA Unspecified fall, initial encounter: Secondary | ICD-10-CM

## 2021-02-04 DIAGNOSIS — I959 Hypotension, unspecified: Secondary | ICD-10-CM | POA: Diagnosis present

## 2021-02-04 DIAGNOSIS — Y92009 Unspecified place in unspecified non-institutional (private) residence as the place of occurrence of the external cause: Secondary | ICD-10-CM

## 2021-02-04 LAB — URINALYSIS, ROUTINE W REFLEX MICROSCOPIC
Bilirubin Urine: NEGATIVE
Glucose, UA: NEGATIVE mg/dL
Ketones, ur: NEGATIVE mg/dL
Nitrite: NEGATIVE
Protein, ur: NEGATIVE mg/dL
Specific Gravity, Urine: 1.008 (ref 1.005–1.030)
Squamous Epithelial / HPF: NONE SEEN (ref 0–5)
pH: 5 (ref 5.0–8.0)

## 2021-02-04 LAB — BLOOD CULTURE ID PANEL (REFLEXED) - BCID2

## 2021-02-04 LAB — CBC
HCT: 33.4 % — ABNORMAL LOW (ref 36.0–46.0)
Hemoglobin: 10.8 g/dL — ABNORMAL LOW (ref 12.0–15.0)
MCH: 27.6 pg (ref 26.0–34.0)
MCHC: 32.3 g/dL (ref 30.0–36.0)
MCV: 85.4 fL (ref 80.0–100.0)
Platelets: 153 10*3/uL (ref 150–400)
RBC: 3.91 MIL/uL (ref 3.87–5.11)
RDW: 14.2 % (ref 11.5–15.5)
WBC: 16.2 10*3/uL — ABNORMAL HIGH (ref 4.0–10.5)
nRBC: 0 % (ref 0.0–0.2)

## 2021-02-04 LAB — BLOOD GAS, VENOUS
Acid-base deficit: 4.7 mmol/L — ABNORMAL HIGH (ref 0.0–2.0)
Bicarbonate: 21.1 mmol/L (ref 20.0–28.0)
O2 Saturation: 34.2 %
Patient temperature: 37
pCO2, Ven: 41 mmHg — ABNORMAL LOW (ref 44.0–60.0)
pH, Ven: 7.32 (ref 7.250–7.430)
pO2, Ven: <31 mmHg — CL (ref 32.0–45.0)

## 2021-02-04 LAB — COMPREHENSIVE METABOLIC PANEL
ALT: 19 U/L (ref 0–44)
ALT: 20 U/L (ref 0–44)
AST: 24 U/L (ref 15–41)
AST: 25 U/L (ref 15–41)
Albumin: 2.4 g/dL — ABNORMAL LOW (ref 3.5–5.0)
Albumin: 2.7 g/dL — ABNORMAL LOW (ref 3.5–5.0)
Alkaline Phosphatase: 106 U/L (ref 38–126)
Alkaline Phosphatase: 107 U/L (ref 38–126)
Anion gap: 5 (ref 5–15)
Anion gap: 7 (ref 5–15)
BUN: 63 mg/dL — ABNORMAL HIGH (ref 8–23)
BUN: 64 mg/dL — ABNORMAL HIGH (ref 8–23)
CO2: 15 mmol/L — ABNORMAL LOW (ref 22–32)
CO2: 18 mmol/L — ABNORMAL LOW (ref 22–32)
Calcium: 8.6 mg/dL — ABNORMAL LOW (ref 8.9–10.3)
Calcium: 8.7 mg/dL — ABNORMAL LOW (ref 8.9–10.3)
Chloride: 104 mmol/L (ref 98–111)
Chloride: 105 mmol/L (ref 98–111)
Creatinine, Ser: 1.47 mg/dL — ABNORMAL HIGH (ref 0.44–1.00)
Creatinine, Ser: 1.49 mg/dL — ABNORMAL HIGH (ref 0.44–1.00)
GFR, Estimated: 33 mL/min — ABNORMAL LOW (ref >60)
GFR, Estimated: 33 mL/min — ABNORMAL LOW (ref >60)
Glucose, Bld: 106 mg/dL — ABNORMAL HIGH (ref 70–99)
Glucose, Bld: 115 mg/dL — ABNORMAL HIGH (ref 70–99)
Potassium: 5.5 mmol/L — ABNORMAL HIGH (ref 3.5–5.1)
Potassium: 7.4 mmol/L (ref 3.5–5.1)
Sodium: 125 mmol/L — ABNORMAL LOW (ref 135–145)
Sodium: 129 mmol/L — ABNORMAL LOW (ref 135–145)
Total Bilirubin: 1.2 mg/dL (ref 0.3–1.2)
Total Bilirubin: 1.4 mg/dL — ABNORMAL HIGH (ref 0.3–1.2)
Total Protein: 5.7 g/dL — ABNORMAL LOW (ref 6.5–8.1)
Total Protein: 6.6 g/dL (ref 6.5–8.1)

## 2021-02-04 LAB — PROTIME-INR
INR: 2.2 — ABNORMAL HIGH (ref 0.8–1.2)
Prothrombin Time: 24 seconds — ABNORMAL HIGH (ref 11.4–15.2)

## 2021-02-04 LAB — TROPONIN I (HIGH SENSITIVITY)
Troponin I (High Sensitivity): 29 ng/L — ABNORMAL HIGH (ref <18)
Troponin I (High Sensitivity): 32 ng/L — ABNORMAL HIGH (ref <18)

## 2021-02-04 LAB — TSH: TSH: 2.956 u[IU]/mL (ref 0.350–4.500)

## 2021-02-04 LAB — RESP PANEL BY RT-PCR (FLU A&B, COVID) ARPGX2
Influenza A by PCR: NEGATIVE
Influenza B by PCR: NEGATIVE
SARS Coronavirus 2 by RT PCR: NEGATIVE

## 2021-02-04 LAB — T4, FREE: Free T4: 1.65 ng/dL — ABNORMAL HIGH (ref 0.61–1.12)

## 2021-02-04 LAB — LACTIC ACID, PLASMA
Lactic Acid, Venous: 3.9 mmol/L (ref 0.5–1.9)
Lactic Acid, Venous: 4.1 mmol/L (ref 0.5–1.9)
Lactic Acid, Venous: 1.8 mmol/L (ref 0.5–1.9)

## 2021-02-04 LAB — PROCALCITONIN: Procalcitonin: 8.71 ng/mL

## 2021-02-04 LAB — BRAIN NATRIURETIC PEPTIDE: B Natriuretic Peptide: 494.2 pg/mL — ABNORMAL HIGH (ref 0.0–100.0)

## 2021-02-04 LAB — CORTISOL-AM, BLOOD: Cortisol - AM: 33.4 ug/dL — ABNORMAL HIGH (ref 6.7–22.6)

## 2021-02-04 LAB — LIPASE, BLOOD: Lipase: 34 U/L (ref 11–51)

## 2021-02-04 LAB — POTASSIUM: Potassium: 4.8 mmol/L (ref 3.5–5.1)

## 2021-02-04 MED ORDER — SODIUM CHLORIDE 0.9 % IV SOLN
2.0000 g | INTRAVENOUS | Status: AC
  Administered 2021-02-05 – 2021-02-09 (×5): 2 g via INTRAVENOUS
  Filled 2021-02-04 (×4): qty 20
  Filled 2021-02-04: qty 2

## 2021-02-04 MED ORDER — IPRATROPIUM-ALBUTEROL 0.5-2.5 (3) MG/3ML IN SOLN
6.0000 mL | Freq: Once | RESPIRATORY_TRACT | Status: AC
  Administered 2021-02-04: 6 mL via RESPIRATORY_TRACT
  Filled 2021-02-04: qty 6

## 2021-02-04 MED ORDER — CALCIUM GLUCONATE 10 % IV SOLN
INTRAVENOUS | Status: AC
  Filled 2021-02-04: qty 10

## 2021-02-04 MED ORDER — METOPROLOL SUCCINATE ER 25 MG PO TB24
12.5000 mg | ORAL_TABLET | Freq: Every day | ORAL | Status: DC
  Administered 2021-02-05: 12.5 mg via ORAL
  Filled 2021-02-04: qty 0.5
  Filled 2021-02-04: qty 1

## 2021-02-04 MED ORDER — FUROSEMIDE 10 MG/ML IJ SOLN
20.0000 mg | Freq: Once | INTRAMUSCULAR | Status: AC
  Administered 2021-02-04: 20 mg via INTRAVENOUS
  Filled 2021-02-04: qty 4

## 2021-02-04 MED ORDER — INSULIN ASPART 100 UNIT/ML IV SOLN
5.0000 [IU] | Freq: Once | INTRAVENOUS | Status: AC
  Filled 2021-02-04: qty 0.05

## 2021-02-04 MED ORDER — LACTATED RINGERS IV BOLUS
500.0000 mL | Freq: Once | INTRAVENOUS | Status: AC
  Administered 2021-02-04: 500 mL via INTRAVENOUS

## 2021-02-04 MED ORDER — ALBUTEROL SULFATE (2.5 MG/3ML) 0.083% IN NEBU
10.0000 mg | INHALATION_SOLUTION | Freq: Once | RESPIRATORY_TRACT | Status: AC
  Administered 2021-02-04: 10 mg via RESPIRATORY_TRACT
  Filled 2021-02-04: qty 12

## 2021-02-04 MED ORDER — LEVOFLOXACIN IN D5W 750 MG/150ML IV SOLN
750.0000 mg | Freq: Once | INTRAVENOUS | Status: AC
  Administered 2021-02-04: 750 mg via INTRAVENOUS
  Filled 2021-02-04: qty 150

## 2021-02-04 MED ORDER — LACTATED RINGERS IV SOLN: INTRAVENOUS | Status: DC

## 2021-02-04 MED ORDER — APIXABAN 5 MG PO TABS
5.0000 mg | ORAL_TABLET | Freq: Two times a day (BID) | ORAL | Status: DC
  Administered 2021-02-04 – 2021-02-09 (×11): 5 mg via ORAL
  Filled 2021-02-04 (×11): qty 1

## 2021-02-04 MED ORDER — ONDANSETRON HCL 4 MG/2ML IJ SOLN
4.0000 mg | Freq: Four times a day (QID) | INTRAMUSCULAR | Status: DC | PRN
  Administered 2021-02-05: 15:00:00 4 mg via INTRAVENOUS
  Filled 2021-02-04: qty 2

## 2021-02-04 MED ORDER — LACTATED RINGERS IV BOLUS
250.0000 mL | Freq: Once | INTRAVENOUS | Status: AC
  Administered 2021-02-04: 250 mL via INTRAVENOUS

## 2021-02-04 MED ORDER — SODIUM CHLORIDE 0.9 % IV SOLN: 500.0000 mg | INTRAVENOUS | Status: DC

## 2021-02-04 MED ORDER — METOPROLOL SUCCINATE ER 50 MG PO TB24: 25.0000 mg | ORAL_TABLET | Freq: Every day | ORAL | Status: DC

## 2021-02-04 MED ORDER — SODIUM ZIRCONIUM CYCLOSILICATE 10 G PO PACK
10.0000 g | PACK | Freq: Once | ORAL | Status: DC
  Filled 2021-02-04: qty 1

## 2021-02-04 MED ORDER — DEXTROSE 50 % IV SOLN
1.0000 | Freq: Once | INTRAVENOUS | Status: AC
  Administered 2021-02-04: 50 mL via INTRAVENOUS
  Filled 2021-02-04: qty 50

## 2021-02-04 MED ORDER — HYDROCODONE-ACETAMINOPHEN 5-325 MG PO TABS
1.0000 | ORAL_TABLET | ORAL | Status: DC | PRN
  Administered 2021-02-11 (×2): 1 via ORAL
  Filled 2021-02-04 (×2): qty 1

## 2021-02-04 MED ORDER — SODIUM CHLORIDE 0.9 % IV SOLN
500.0000 mg | INTRAVENOUS | Status: DC
  Administered 2021-02-06 – 2021-02-09 (×4): 500 mg via INTRAVENOUS
  Filled 2021-02-04: qty 5
  Filled 2021-02-04 (×3): qty 500

## 2021-02-04 MED ORDER — INSULIN ASPART 100 UNIT/ML IJ SOLN
INTRAMUSCULAR | Status: AC
  Administered 2021-02-04: 5 [IU] via INTRAVENOUS
  Filled 2021-02-04: qty 1

## 2021-02-04 MED ORDER — ACETAMINOPHEN 500 MG PO TABS
1000.0000 mg | ORAL_TABLET | Freq: Once | ORAL | Status: AC
Start: 2021-02-04 — End: 2021-02-04

## 2021-02-04 MED ORDER — ACETAMINOPHEN 650 MG RE SUPP: 650.0000 mg | Freq: Four times a day (QID) | RECTAL | Status: DC | PRN

## 2021-02-04 MED ORDER — IOHEXOL 350 MG/ML SOLN
50.0000 mL | Freq: Once | INTRAVENOUS | Status: AC | PRN
  Administered 2021-02-04: 50 mL via INTRAVENOUS

## 2021-02-04 MED ORDER — METOPROLOL SUCCINATE ER 50 MG PO TB24
25.0000 mg | ORAL_TABLET | Freq: Once | ORAL | Status: AC
  Administered 2021-02-04: 25 mg via ORAL
  Filled 2021-02-04: qty 1

## 2021-02-04 MED ORDER — SODIUM CHLORIDE 0.9 % IV SOLN: 2.0000 g | INTRAVENOUS | Status: DC

## 2021-02-04 MED ORDER — SODIUM CHLORIDE 0.9 % IV SOLN: 1.0000 g | INTRAVENOUS | Status: DC

## 2021-02-04 MED ORDER — CALCIUM GLUCONATE-NACL 1-0.675 GM/50ML-% IV SOLN
1.0000 g | Freq: Once | INTRAVENOUS | Status: AC
  Administered 2021-02-04: 1000 mg via INTRAVENOUS
  Filled 2021-02-04: qty 50

## 2021-02-04 MED ORDER — SODIUM BICARBONATE 8.4 % IV SOLN
50.0000 meq | Freq: Once | INTRAVENOUS | Status: AC
  Administered 2021-02-04: 50 meq via INTRAVENOUS
  Filled 2021-02-04: qty 50

## 2021-02-04 MED ORDER — ACETAMINOPHEN 500 MG PO TABS
ORAL_TABLET | ORAL | Status: AC
  Administered 2021-02-04: 1000 mg via ORAL
  Filled 2021-02-04: qty 2

## 2021-02-04 MED ORDER — ONDANSETRON HCL 4 MG PO TABS
4.0000 mg | ORAL_TABLET | Freq: Four times a day (QID) | ORAL | Status: DC | PRN
  Administered 2021-02-11: 4 mg via ORAL
  Filled 2021-02-04: qty 1

## 2021-02-04 MED ORDER — ACETAMINOPHEN 325 MG PO TABS
650.0000 mg | ORAL_TABLET | Freq: Four times a day (QID) | ORAL | Status: DC | PRN
  Administered 2021-02-05 – 2021-02-10 (×3): 650 mg via ORAL
  Filled 2021-02-04 (×3): qty 2

## 2021-02-04 NOTE — Progress Notes (Signed)
Sepsis tracking by eLINK 

## 2021-02-04 NOTE — Progress Notes (Signed)
Pharmacy Antibiotic Note  Vanessa Logan is a 88 y.o. female admitted on 02/03/2021 with CAP.  Pharmacy has been consulted for Levaquin to Azithromycin and Ceftriaxone x 5 days.  Plan: Pt given dose of Levaquin 750 mg in ED. Pt with Keflex allergy causing rash documented 2014. Pt with h/o Ceftriaxone in 2019 for UTI and Cefazolin for sx proph in March 2022. Will order Azithromycin 500 mg and Ceftriaxone 1 gm q24h x 5 days starting ~ 18 hr after Levaquin dose given.  Pharmacy will continue to follow and monitor for s/s of allergic rxn and will adjust abx/dosing if warranted.  Height: 5\' 8"  (172.7 cm) Weight: 72.6 kg (160 lb) IBW/kg (Calculated) : 63.9  Temp (24hrs), Avg:97.6 F (36.4 C), Min:97.6 F (36.4 C), Max:97.6 F (36.4 C)  Recent Labs  Lab 02/03/21 2334 02/04/21 0440  WBC 17.3* 16.2*  CREATININE 1.49*  --     Estimated Creatinine Clearance: 24.3 mL/min (A) (by C-G formula based on SCr of 1.49 mg/dL (H)).    Allergies  Allergen Reactions   Neosporin [Neomycin-Bacitracin Zn-Polymyx] Itching and Swelling   Keflex [Cephalexin] Rash    Antimicrobials this admission: 1/11 Levaquin >> x 1 1/11 Azithromycin >> x 5 days 1/11 Ceftriaxone >> x 5 days  Microbiology results: 1/11 BCx: Pending  Thank you for allowing pharmacy to be a part of this patients care.  3/11, PharmD, [facility] 02/04/2021 5:07 AM

## 2021-02-04 NOTE — ED Notes (Signed)
Per MD pt taken of BiPap for trial Pt given some water and will be given food if trial goes well  RT aware

## 2021-02-04 NOTE — Progress Notes (Signed)
PHARMACY - PHYSICIAN COMMUNICATION CRITICAL VALUE ALERT - BLOOD CULTURE IDENTIFICATION (BCID)  Vanessa Logan is an 88 y.o. female who presented to [facility] on 02/03/2021 with a chief complaint of fall  Assessment:  blood culture (single set) from 1/11 with GNR, BCID detected E coli.    Name of physician (or Provider) Contacted: Dr Alvino Chapel  Current antibiotics: Ceftriaxone/azithromycin  Changes to prescribed antibiotics recommended:  Patient is on recommended antibiotics - No changes needed  Results for orders placed or performed during the hospital encounter of 02/03/21  Blood Culture ID Panel (Reflexed) (Collected: 02/04/2021  4:40 AM)  Result Value Ref Range   Enterococcus faecalis NOT DETECTED NOT DETECTED   Enterococcus Faecium NOT DETECTED NOT DETECTED   Listeria monocytogenes NOT DETECTED NOT DETECTED   Staphylococcus species NOT DETECTED NOT DETECTED   Staphylococcus aureus (BCID) NOT DETECTED NOT DETECTED   Staphylococcus epidermidis NOT DETECTED NOT DETECTED   Staphylococcus lugdunensis NOT DETECTED NOT DETECTED   Streptococcus species NOT DETECTED NOT DETECTED   Streptococcus agalactiae NOT DETECTED NOT DETECTED   Streptococcus pneumoniae NOT DETECTED NOT DETECTED   Streptococcus pyogenes NOT DETECTED NOT DETECTED   A.calcoaceticus-baumannii NOT DETECTED NOT DETECTED   Bacteroides fragilis NOT DETECTED NOT DETECTED   Enterobacterales DETECTED (A) NOT DETECTED   Enterobacter cloacae complex NOT DETECTED NOT DETECTED   Escherichia coli DETECTED (A) NOT DETECTED   Klebsiella aerogenes NOT DETECTED NOT DETECTED   Klebsiella oxytoca NOT DETECTED NOT DETECTED   Klebsiella pneumoniae NOT DETECTED NOT DETECTED   Proteus species NOT DETECTED NOT DETECTED   Salmonella species NOT DETECTED NOT DETECTED   Serratia marcescens NOT DETECTED NOT DETECTED   Haemophilus influenzae NOT DETECTED NOT DETECTED   Neisseria meningitidis NOT DETECTED NOT DETECTED   Pseudomonas aeruginosa NOT  DETECTED NOT DETECTED   Stenotrophomonas maltophilia NOT DETECTED NOT DETECTED   Candida albicans NOT DETECTED NOT DETECTED   Candida auris NOT DETECTED NOT DETECTED   Candida glabrata NOT DETECTED NOT DETECTED   Candida krusei NOT DETECTED NOT DETECTED   Candida parapsilosis NOT DETECTED NOT DETECTED   Candida tropicalis NOT DETECTED NOT DETECTED   Cryptococcus neoformans/gattii NOT DETECTED NOT DETECTED   CTX-M ESBL NOT DETECTED NOT DETECTED   Carbapenem resistance IMP NOT DETECTED NOT DETECTED   Carbapenem resistance KPC NOT DETECTED NOT DETECTED   Carbapenem resistance NDM NOT DETECTED NOT DETECTED   Carbapenem resist OXA 48 LIKE NOT DETECTED NOT DETECTED   Carbapenem resistance VIM NOT DETECTED NOT DETECTED   Juliette Alcide, PharmD, BCPS, BCIDP Work Cell: [phone] 02/04/2021 3:52 PM

## 2021-02-04 NOTE — Progress Notes (Signed)
CODE SEPSIS - PHARMACY COMMUNICATION  **Broad Spectrum Antibiotics should be administered within 1 hour of Sepsis diagnosis**  Time Code Sepsis Called/Page Received: 0425  Antibiotics Ordered: Levaquin  Time of 1st antibiotic administration: 0455  Otelia Sergeant, PharmD, [facility] 02/04/2021 4:29 AM

## 2021-02-04 NOTE — Sepsis Progress Note (Signed)
Repeat lactic acid pending.

## 2021-02-04 NOTE — H&P (Signed)
History and Physical    [name]:[id number] DOB: 1926-08-23 DOA: 02/03/2021  PCP: Marguarite Arbour, MD   Patient coming from: fall  I have personally briefly reviewed patient's relevant medical records in [facility]  Chief Complaint: right hip pain  HPI: Vanessa Logan is a 88 y.o. female with medical history significant for HTN, A. fib on Eliquis who lives alone was brought to the ED initially for right hip pain following a fall off the commode 2 days prior.  She was able to get up and has been ambulating with her walker since.  States she hit her right hip and her head.  Most of the history is provided by the son at the bedside who states he brought him because he was concerned about her continued complaints of right hip pain and he found his speech to be a bit slurred.  Patient denies being previously unwell prior to the fall.  She denies chest pain, lightheadedness or shortness of breath.  She was not recently ill and has had no nausea, vomiting or diarrhea no fever or chills.  She denies dysuria  ED course: On arrival, hypotensive to 98/57, tachycardic to 116 and tachypneic to 33 with O2 sat in the high 90s on room air Blood work: Potassium 7.4, creatinine 1.49 with bicarb 15 and normal anion gap Sodium 125 WBC 17,000, lactic acid pending Troponin 32-29 TSH 2.9 and free T4 elevated at 1.65 Venous pH 7.32 with PCO2 41 - COVID and flu negative  EKG, personally viewed and interpreted: A. fib at 145 with nonspecific ST-T wave changes  Imaging: CT head and C-spine and x-ray left knee unremarkable. Chest x-ray nonacute Right venous Doppler lower extremity negative for DVT CTA chest pending  Patient treated with sodium bicarb, insulin and dextrose, Lasix and Lokelma in the ED as well as duo nebs and an IV fluid bolus Patient was started on BiPAP after becoming increasingly tachypneic, while maintaining O2 sat  CTA chest negative for PE but showing possible multifocal  pneumonia  Patient started on sepsis protocol for pneumonia.  Hospitalist consulted for admission.  review of Systems: As per HPI otherwise all other systems on review of systems negative.   Assessment/Plan  Severe sepsis secondary to multifocal pneumonia Acute respiratory failure on BiPAP - Continue sepsis fluids - Continue antibiotics - Continue BiPAP and wean as tolerated - Follow lactic acid and procalcitonin - Follow cultures    AKI (acute kidney injury) (HCC)   Metabolic acidosis - IV hydration - Monitor renal function and avoid nephrotoxins    Hyperkalemia - Received multiple agents for correction in the emergency room - Monitor potassium and correct    Rapid atrial fibrillation (HCC) - Likely secondary to acute process, dehydration - Continue metoprolol as BP will tolerate - Continue apixaban  Possible presyncope/fall at home, initial encounter Right hip pain - Suspecting possible presyncope related to acute illness and a course initial fall - Continuous cardiac monitoring, echocardiogram - Trauma imaging negative - We will get x-ray right hip given hip pain   DVT prophylaxis: Eliquis Code Status: full code  Family Communication: Son at bedside Disposition Plan: Back to previous home environment Consults called: none  Status:At the time of admission, it appears that the appropriate admission status for this patient is INPATIENT. This is judged to be reasonable and necessary in order to provide the required intensity of service to ensure the patient's safety given the presenting symptoms, physical exam findings, and initial radiographic  and laboratory data in the context of their  Comorbid conditions.   Patient requires inpatient status due to high intensity of service, high risk for further deterioration and high frequency of surveillance required.   I certify that at the point of admission it is my clinical judgment that the patient will require inpatient  hospital care spanning beyond 2 midnights     Physical Exam: Vitals:   02/04/21 0002 02/04/21 0100 02/04/21 0254 02/04/21 0315  BP: 99/64 106/65  107/77  Pulse: 89 (!) 111 (!) 116 (!) 125  Resp: (!) 27 (!) 33  (!) 27  Temp:      TempSrc:      SpO2: 97% 100% 100% 100%  Weight:      Height:       Constitutional: Ill-appearing, oriented x 3 .  Conversational dyspnea HEENT:      Head: Normocephalic and atraumatic.         Eyes: PERLA, EOMI, Conjunctivae are normal. Sclera is non-icteric.       Mouth/Throat: Mucous membranes are moist.       Neck: Supple with no signs of meningismus. Cardiovascular: Tachycardic. No murmurs, gallops, or rubs. 2+ symmetrical distal pulses are present . No JVD. No  LE edema Respiratory: Increased, tachypneic with scattered wheezes Gastrointestinal: Soft, non tender, non distended. Positive bowel sounds.  Genitourinary: No CVA tenderness. Musculoskeletal: Nontender with normal range of motion in all extremities. No cyanosis, or erythema of extremities. Neurologic:  Face is symmetric. Moving all extremities. No gross focal neurologic deficits . Skin: Skin is warm, dry.  No rash or ulcers Psychiatric: Mood and affect are appropriate     Past Medical History:  Diagnosis Date   Atrial fibrillation (HCC)    Breast screening, unspecified 06/18/11   Diffuse cystic mastopathy    Family history of malignant neoplasm of breast 2013   Obesity, unspecified 2013   Screening for obesity 2013   Special screening for malignant neoplasms, colon 06/21/2011   Unspecified essential hypertension     Past Surgical History:  Procedure Laterality Date   APPENDECTOMY  1950   BREAST BIOPSY Bilateral 1992   neg   BREAST BIOPSY Right 1996   neg   BREAST BIOPSY Right 2005   neg   COLONOSCOPY  2007   COLONOSCOPY  2013   Dr Ricki RodriguezSkulski   COLONOSCOPY W/ POLYPECTOMY  2004   ORIF WRIST FRACTURE Left 04/15/2020   Procedure: OPEN REDUCTION INTERNAL FIXATION LEFT DISTAL RADIUS  FRACTION;  Surgeon: Kennedy BuckerMenz, Michael, MD;  Location: [facility] ORS;  Service: Orthopedics;  Laterality: Left;   VARICOSE VEIN SURGERY  1983     reports that she has never smoked. She has never used smokeless tobacco. She reports that she does not drink alcohol and does not use drugs.  Allergies  Allergen Reactions   Neosporin [Neomycin-Bacitracin Zn-Polymyx] Itching and Swelling   Keflex [Cephalexin] Rash    Family History  Problem Relation Age of Onset   Breast cancer Sister [id number]   Pancreatic cancer Sister    Colon cancer Daughter    Breast cancer Sister [id number]      Prior to Admission medications   Medication Sig Start Date End Date Taking? Authorizing Provider  acetaminophen (TYLENOL) 500 MG tablet Take 500 mg by mouth every 6 (six) hours as needed for moderate pain or mild pain.    [provider]  apixaban (ELIQUIS) 5 MG TABS tablet Take 5 mg by mouth 2 (two) times daily.    [provider]  hydrochlorothiazide (HYDRODIURIL) 50 MG tablet Take 50 mg by mouth daily.    [provider]  losartan (COZAAR) 50 MG tablet Take 1 tablet by mouth daily. 08/26/14   [provider]  metoprolol succinate (TOPROL-XL) 25 MG 24 hr tablet Take 12.5 mg by mouth at bedtime.     [provider]  ondansetron (ZOFRAN ODT) 4 MG disintegrating tablet Take 1 tablet (4 mg total) by mouth every 8 (eight) hours as needed for nausea or vomiting. 04/05/20   Shaune PollackIsaacs, Cameron, MD  oxybutynin (DITROPAN-XL) 10 MG 24 hr tablet Take 10 mg by mouth 2 (two) times daily. 08/26/14   [provider]  oxyCODONE (ROXICODONE) 5 MG immediate release tablet Take 0.5 tablets (2.5 mg total) by mouth every 8 (eight) hours as needed for severe pain. 04/05/20 04/05/21  Shaune PollackIsaacs, Cameron, MD  potassium chloride SA (K-DUR,KLOR-CON) 20 MEQ tablet Take 40 mEq by mouth 2 (two) times daily.  03/05/14   [provider]      Labs on Admission: I have personally reviewed following labs and imaging  studies  CBC: Recent Labs  Lab 02/03/21 2334  WBC 17.3*  HGB 11.9*  HCT 37.0  MCV 84.3  PLT 176   Basic Metabolic Panel: Recent Labs  Lab 02/03/21 2334  NA 125*  K 7.4*  CL 105  CO2 15*  GLUCOSE 106*  BUN 64*  CREATININE 1.49*  CALCIUM 8.7*   GFR: Estimated Creatinine Clearance: 24.3 mL/min (A) (by C-G formula based on SCr of 1.49 mg/dL (H)). Liver Function Tests: Recent Labs  Lab 02/03/21 2334  AST 24  ALT 20  ALKPHOS 107  BILITOT 1.4*  PROT 6.6  ALBUMIN 2.7*   Recent Labs  Lab 02/03/21 2334  LIPASE 34   No results for input(s): AMMONIA in the last 168 hours. Coagulation Profile: No results for input(s): INR, PROTIME in the last 168 hours. Cardiac Enzymes: No results for input(s): CKTOTAL, CKMB, CKMBINDEX, TROPONINI in the last 168 hours. BNP (last 3 results) No results for input(s): PROBNP in the last 8760 hours. HbA1C: No results for input(s): HGBA1C in the last 72 hours. CBG: No results for input(s): GLUCAP in the last 168 hours. Lipid Profile: No results for input(s): CHOL, HDL, LDLCALC, TRIG, CHOLHDL, LDLDIRECT in the last 72 hours. Thyroid Function Tests: Recent Labs    02/03/21 2334  TSH 2.956  FREET4 1.65*   Anemia Panel: No results for input(s): VITAMINB12, FOLATE, FERRITIN, TIBC, IRON, RETICCTPCT in the last 72 hours. Urine analysis:    Component Value Date/Time   COLORURINE STRAW (A) 11/24/2017 1712   APPEARANCEUR CLEAR (A) 11/24/2017 1712   LABSPEC 1.008 11/24/2017 1712   PHURINE 5.0 11/24/2017 1712   GLUCOSEU NEGATIVE 11/24/2017 1712   HGBUR NEGATIVE 11/24/2017 1712   BILIRUBINUR NEGATIVE 11/24/2017 1712   KETONESUR NEGATIVE 11/24/2017 1712   PROTEINUR NEGATIVE 11/24/2017 1712   NITRITE NEGATIVE 11/24/2017 1712   LEUKOCYTESUR NEGATIVE 11/24/2017 1712    Radiological Exams on Admission: DG Chest 1 View  Result Date: 02/03/2021 CLINICAL DATA:  Fall, atrial fibrillation EXAM: CHEST  1 VIEW COMPARISON:  06/07/2014 FINDINGS:  Mild cardiomegaly. Biapical scarring. Lungs otherwise clear. No effusions. No acute bony abnormality. IMPRESSION: Mild cardiomegaly.  No active disease. Electronically Signed   By: Charlett NoseKevin  Dover M.D.   On: 02/03/2021 23:57   CT HEAD WO CONTRAST  Result Date: 02/03/2021 CLINICAL DATA:  Frequent falls.  Head trauma, moderate-severe EXAM: CT HEAD WITHOUT CONTRAST TECHNIQUE: Contiguous axial  images were obtained from the base of the skull through the vertex without intravenous contrast. RADIATION DOSE REDUCTION: This exam was performed according to the departmental dose-optimization program which includes automated exposure control, adjustment of the mA and/or kV according to patient size and/or use of iterative reconstruction technique. COMPARISON:  12/25/2020 FINDINGS: Brain: No acute intracranial abnormality. Specifically, no hemorrhage, hydrocephalus, mass lesion, acute infarction, or significant intracranial injury. Vascular: No hyperdense vessel or unexpected calcification. Skull: No acute calvarial abnormality. Sinuses/Orbits: No acute findings Other: None IMPRESSION: No acute intracranial abnormality. Electronically Signed   By: Charlett Nose M.D.   On: 02/03/2021 23:55   CT Cervical Spine Wo Contrast  Result Date: 02/03/2021 CLINICAL DATA:  Neck trauma (Age >= 65y).  Fall. EXAM: CT CERVICAL SPINE WITHOUT CONTRAST TECHNIQUE: Multidetector CT imaging of the cervical spine was performed without intravenous contrast. Multiplanar CT image reconstructions were also generated. RADIATION DOSE REDUCTION: This exam was performed according to the departmental dose-optimization program which includes automated exposure control, adjustment of the mA and/or kV according to patient size and/or use of iterative reconstruction technique. COMPARISON:  04/05/2020 FINDINGS: Alignment: Normal Skull base and vertebrae: No acute fracture. No primary bone lesion or focal pathologic process. Soft tissues and spinal canal: No  prevertebral fluid or swelling. No visible canal hematoma. Disc levels: Mild degenerative disc disease, most pronounced at C5-6 and C6-7. Moderate bilateral degenerative facet disease. Upper chest: Biapical scarring.  No acute findings Other: None IMPRESSION: Degenerative disc and facet disease. No acute bony abnormality. Electronically Signed   By: Charlett Nose M.D.   On: 02/03/2021 23:57   US Venous Img Lower Unilateral Right  Result Date: 02/04/2021 CLINICAL DATA:  Right lower extremity pain. EXAM: RIGHT LOWER EXTREMITY VENOUS DOPPLER ULTRASOUND TECHNIQUE: Gray-scale sonography with compression, as well as color and duplex ultrasound, were performed to evaluate the deep venous system(s) from the level of the common femoral vein through the popliteal and proximal calf veins. COMPARISON:  Similar study 02/24/2016 FINDINGS: VENOUS Normal compressibility of the common femoral, superficial femoral, and popliteal veins, as well as the visualized calf veins. Visualized portions of profunda femoral vein and great saphenous vein unremarkable. No filling defects to suggest DVT on grayscale or color Doppler imaging. Doppler waveforms show normal direction of venous flow, normal respiratory plasticity and response to augmentation. Limited views of the contralateral common femoral vein are unremarkable. OTHER None. Limitations: none IMPRESSION: Negative. Comparison to prior study reveals no significant interval change. Electronically Signed   By: Almira Bar M.D.   On: 02/04/2021 02:41   DG Chest Portable 1 View  Result Date: 02/04/2021 CLINICAL DATA:  Shortness of breath. EXAM: PORTABLE CHEST 1 VIEW COMPARISON:  Chest radiograph dated 02/03/2021. FINDINGS: There is cardiomegaly. No focal consolidation, pleural effusion or pneumothorax. No acute osseous pathology. IMPRESSION: No acute cardiopulmonary process. Cardiomegaly. Electronically Signed   By: Elgie Collard M.D.   On: 02/04/2021 02:49   DG Knee Complete  4 Views Left  Result Date: 02/04/2021 CLINICAL DATA:  Left knee pain. EXAM: LEFT KNEE - COMPLETE 4+ VIEW COMPARISON:  None. FINDINGS: There is no acute fracture or dislocation. The bones are osteopenic. Moderate arthritic changes with tricompartmental narrowing. No joint effusion. The soft tissues are unremarkable. IMPRESSION: 1. No acute fracture or dislocation. 2. Moderate arthritic change. Electronically Signed   By: Elgie Collard M.D.   On: 02/04/2021 01:24   DG Knee Complete 4 Views Right  Result Date: 02/04/2021 CLINICAL DATA:  No knee pain. EXAM:  RIGHT KNEE - COMPLETE 4+ VIEW COMPARISON:  None. FINDINGS: There is no acute fracture or dislocation. The bones are osteopenic. Severe arthritic changes of the right knee with tricompartmental narrowing and spurring. Small suprapatellar effusion. The soft tissues are unremarkable IMPRESSION: 1. No acute fracture or dislocation. 2. Severe arthritic changes of the right knee. Electronically Signed   By: Elgie Collard M.D.   On: 02/04/2021 01:23       Andris Baumann MD [facility]   02/04/2021, 3:17 AM

## 2021-02-04 NOTE — ED Provider Notes (Signed)
[facility] Provider Note    Event Date/Time   First MD Initiated Contact with Patient 02/03/21 2338     (approximate)   History   Fall   HPI  Vanessa Logan is a 88 y.o. female with a history of atrial fibrillation on Eliquis, hypertension, hyperlipidemia who presents for evaluation of fall.  According to patient she had a fall 2 days ago.  She reports that she was in the commode and had not turned the light on when she tripped and fell.  She has had right knee pain and today her son decided  that she needed to come and get checked out.  She reports hitting her head when she fell but denies LOC.  She does use a walker.  She lives alone and is very independent.  She has noted some shortness of breath which is worse with ambulation over the last week.  She denies cough or congestion, fever or chills, chest pain, hemoptysis.  No personal or family history.  DVT, no hemoptysis or exogenous hormones patient Dors is compliance with her Eliquis.     Past Medical History:  Diagnosis Date   Atrial fibrillation ([place])    Breast screening, unspecified 06/18/11   Diffuse cystic mastopathy    Family history of malignant neoplasm of breast 2013   Obesity, unspecified 2013   Screening for obesity 2013   Special screening for malignant neoplasms, colon 06/21/2011   Unspecified essential hypertension     Past Surgical History:  Procedure Laterality Date   APPENDECTOMY  1950   BREAST BIOPSY Bilateral 1992   neg   BREAST BIOPSY Right 1996   neg   BREAST BIOPSY Right 2005   neg   COLONOSCOPY  2007   COLONOSCOPY  2013   Dr Donnella Sham   COLONOSCOPY W/ POLYPECTOMY  2004   ORIF WRIST FRACTURE Left 04/15/2020   Procedure: OPEN REDUCTION INTERNAL FIXATION LEFT DISTAL RADIUS FRACTION;  Surgeon: Hessie Knows, MD;  Location: [facility] ORS;  Service: Orthopedics;  Laterality: Left;   VARICOSE VEIN SURGERY  1983     Physical Exam   Triage Vital Signs: ED Triage Vitals  Enc Vitals  Group     BP 02/03/21 2323 (!) 98/57     Pulse Rate 02/03/21 2323 82     Resp 02/03/21 2323 18     Temp 02/03/21 2323 97.6 F (36.4 C)     Temp Source 02/03/21 2323 Oral     SpO2 02/03/21 2323 100 %     Weight 02/03/21 2318 160 lb (72.6 kg)     Height 02/03/21 2318 5' 8"  (1.727 m)     Head Circumference --      Peak Flow --      Pain Score 02/03/21 2318 0     Pain Loc --      Pain Edu? --      Excl. in [place]? --     Most recent vital signs: Vitals:   02/04/21 0315 02/04/21 0404  BP: 107/77   Pulse: (!) 125 (!) 125  Resp: (!) 27   Temp:    SpO2: 100% 100%     Constitutional: Alert and oriented, mild respiratory distress.  HEENT:      Head: Normocephalic and atraumatic.         Eyes: Conjunctivae are normal. Sclera is non-icteric.       Mouth/Throat: Mucous membranes are moist.       Neck: Supple with no signs of  meningismus.  No midline spine tenderness Cardiovascular: Regular rate and rhythm. No murmurs, gallops, or rubs. 2+ symmetrical distal pulses are present in all extremities.  Respiratory: Dyspneic with increased work of breathing, tachypneic with respiratory rate in the mid 30s, not hypoxic, clear lungs with no wheezing or crackles Gastrointestinal: Soft, non tender, and non distended with positive bowel sounds. No rebound or guarding. Genitourinary: No CVA tenderness. Musculoskeletal:  No edema, cyanosis, or erythema of extremities.  Full painless range of motion of joints in the lower extremities with no deformity or tenderness.  No midline spine tenderness Neurologic: Normal speech and language. Face is symmetric. Moving all extremities. No gross focal neurologic deficits are appreciated. Skin: Skin is warm, dry and intact. No rash noted. Psychiatric: Mood and affect are normal. Speech and behavior are normal.  ED Results / Procedures / Treatments   Labs (all labs ordered are listed, but only abnormal results are displayed) Labs Reviewed  CBC - Abnormal;  Notable for the following components:      Result Value   WBC 17.3 (*)    Hemoglobin 11.9 (*)    All other components within normal limits  COMPREHENSIVE METABOLIC PANEL - Abnormal; Notable for the following components:   Sodium 125 (*)    Potassium 7.4 (*)    CO2 15 (*)    Glucose, Bld 106 (*)    BUN 64 (*)    Creatinine, Ser 1.49 (*)    Calcium 8.7 (*)    Albumin 2.7 (*)    Total Bilirubin 1.4 (*)    GFR, Estimated 33 (*)    All other components within normal limits  T4, FREE - Abnormal; Notable for the following components:   Free T4 1.65 (*)    All other components within normal limits  BLOOD GAS, VENOUS - Abnormal; Notable for the following components:   pCO2, Ven 41 (*)    pO2, Ven <31.0 (*)    Acid-base deficit 4.7 (*)    All other components within normal limits  BRAIN NATRIURETIC PEPTIDE - Abnormal; Notable for the following components:   B Natriuretic Peptide 494.2 (*)    All other components within normal limits  TROPONIN I (HIGH SENSITIVITY) - Abnormal; Notable for the following components:   Troponin I (High Sensitivity) 32 (*)    All other components within normal limits  TROPONIN I (HIGH SENSITIVITY) - Abnormal; Notable for the following components:   Troponin I (High Sensitivity) 29 (*)    All other components within normal limits  RESP PANEL BY RT-PCR (FLU A&B, COVID) ARPGX2  CULTURE, BLOOD (SINGLE)  LIPASE, BLOOD  TSH  URINALYSIS, ROUTINE W REFLEX MICROSCOPIC  LACTIC ACID, PLASMA  LACTIC ACID, PLASMA  PROCALCITONIN     EKG  ED ECG REPORT I, Rudene Re, the attending physician, personally viewed and interpreted this ECG.  A. fib with rate of 143, no ST elevations or depressions   RADIOLOGY I, Rudene Re, attending MD, have personally viewed and interpreted the images obtained during this visit as below:  CT head and cervical spine with no acute traumatic injury  Chest x-ray with no signs of edema or pneumonia  Knee x-ray with  no signs of fracture or dislocation   ___________________________________________________ Interpretation by Radiologist:  DG Chest 1 View  Result Date: 02/03/2021 CLINICAL DATA:  Fall, atrial fibrillation EXAM: CHEST  1 VIEW COMPARISON:  06/07/2014 FINDINGS: Mild cardiomegaly. Biapical scarring. Lungs otherwise clear. No effusions. No acute bony abnormality. IMPRESSION: Mild cardiomegaly.  No  active disease. Electronically Signed   By: Rolm Baptise M.D.   On: 02/03/2021 23:57   CT HEAD WO CONTRAST  Result Date: 02/03/2021 CLINICAL DATA:  Frequent falls.  Head trauma, moderate-severe EXAM: CT HEAD WITHOUT CONTRAST TECHNIQUE: Contiguous axial images were obtained from the base of the skull through the vertex without intravenous contrast. RADIATION DOSE REDUCTION: This exam was performed according to the departmental dose-optimization program which includes automated exposure control, adjustment of the mA and/or kV according to patient size and/or use of iterative reconstruction technique. COMPARISON:  12/25/2020 FINDINGS: Brain: No acute intracranial abnormality. Specifically, no hemorrhage, hydrocephalus, mass lesion, acute infarction, or significant intracranial injury. Vascular: No hyperdense vessel or unexpected calcification. Skull: No acute calvarial abnormality. Sinuses/Orbits: No acute findings Other: None IMPRESSION: No acute intracranial abnormality. Electronically Signed   By: Rolm Baptise M.D.   On: 02/03/2021 23:55   CT Angio Chest PE W and/or Wo Contrast  Result Date: 02/04/2021 CLINICAL DATA:  Pulmonary embolism suspected.  Respiratory distress EXAM: CT ANGIOGRAPHY CHEST WITH CONTRAST TECHNIQUE: Multidetector CT imaging of the chest was performed using the standard protocol during bolus administration of intravenous contrast. Multiplanar CT image reconstructions and MIPs were obtained to evaluate the vascular anatomy. RADIATION DOSE REDUCTION: This exam was performed according to the  departmental dose-optimization program which includes automated exposure control, adjustment of the mA and/or kV according to patient size and/or use of iterative reconstruction technique. CONTRAST:  77m OMNIPAQUE IOHEXOL 350 MG/ML SOLN COMPARISON:  06/08/2014 FINDINGS: Cardiovascular: Satisfactory opacification of the pulmonary arteries but suboptimal assessment due to motion artifact especially affecting the lobar and distal branches at the bases. No evidence of pulmonary embolism. Enlarged appearance of the atria. No pericardial effusion. Mediastinum/Nodes: Negative for adenopathy or mass. Lungs/Pleura: Subpleural reticulonodular density scattered in the bilateral lower lungs. Ground-glass opacity at the apices which is symmetric and is scarring based March 2022 cervical spine CT. Trace bilateral pleural fluid Upper Abdomen: No acute finding Musculoskeletal: Exaggerated thoracic kyphosis with multilevel bridging osteophyte. Review of the MIP images confirms the above findings. IMPRESSION: 1. Scattered reticulonodular opacities which appear infectious/inflammatory. These could be acute or chronic, please correlate for active pneumonia symptoms. 2. Mild cardiomegaly and trace pleural effusions. No pulmonary edema. 3. No evidence of pulmonary embolism but multifocal vessel obscuration from motion artifact. Electronically Signed   By: JJorje GuildM.D.   On: 02/04/2021 04:12   CT Cervical Spine Wo Contrast  Result Date: 02/03/2021 CLINICAL DATA:  Neck trauma (Age >= 65y).  Fall. EXAM: CT CERVICAL SPINE WITHOUT CONTRAST TECHNIQUE: Multidetector CT imaging of the cervical spine was performed without intravenous contrast. Multiplanar CT image reconstructions were also generated. RADIATION DOSE REDUCTION: This exam was performed according to the departmental dose-optimization program which includes automated exposure control, adjustment of the mA and/or kV according to patient size and/or use of iterative  reconstruction technique. COMPARISON:  04/05/2020 FINDINGS: Alignment: Normal Skull base and vertebrae: No acute fracture. No primary bone lesion or focal pathologic process. Soft tissues and spinal canal: No prevertebral fluid or swelling. No visible canal hematoma. Disc levels: Mild degenerative disc disease, most pronounced at C5-6 and C6-7. Moderate bilateral degenerative facet disease. Upper chest: Biapical scarring.  No acute findings Other: None IMPRESSION: Degenerative disc and facet disease. No acute bony abnormality. Electronically Signed   By: KRolm BaptiseM.D.   On: 02/03/2021 23:57   [place]Venous Img Lower Unilateral Right  Result Date: 02/04/2021 CLINICAL DATA:  Right lower extremity pain. EXAM: RIGHT  LOWER EXTREMITY VENOUS DOPPLER ULTRASOUND TECHNIQUE: Gray-scale sonography with compression, as well as color and duplex ultrasound, were performed to evaluate the deep venous system(s) from the level of the common femoral vein through the popliteal and proximal calf veins. COMPARISON:  Similar study 02/24/2016 FINDINGS: VENOUS Normal compressibility of the common femoral, superficial femoral, and popliteal veins, as well as the visualized calf veins. Visualized portions of profunda femoral vein and great saphenous vein unremarkable. No filling defects to suggest DVT on grayscale or color Doppler imaging. Doppler waveforms show normal direction of venous flow, normal respiratory plasticity and response to augmentation. Limited views of the contralateral common femoral vein are unremarkable. OTHER None. Limitations: none IMPRESSION: Negative. Comparison to prior study reveals no significant interval change. Electronically Signed   By: Telford Nab M.D.   On: 02/04/2021 02:41   DG Chest Portable 1 View  Result Date: 02/04/2021 CLINICAL DATA:  Shortness of breath. EXAM: PORTABLE CHEST 1 VIEW COMPARISON:  Chest radiograph dated 02/03/2021. FINDINGS: There is cardiomegaly. No focal consolidation,  pleural effusion or pneumothorax. No acute osseous pathology. IMPRESSION: No acute cardiopulmonary process. Cardiomegaly. Electronically Signed   By: Anner Crete M.D.   On: 02/04/2021 02:49   DG Knee Complete 4 Views Left  Result Date: 02/04/2021 CLINICAL DATA:  Left knee pain. EXAM: LEFT KNEE - COMPLETE 4+ VIEW COMPARISON:  None. FINDINGS: There is no acute fracture or dislocation. The bones are osteopenic. Moderate arthritic changes with tricompartmental narrowing. No joint effusion. The soft tissues are unremarkable. IMPRESSION: 1. No acute fracture or dislocation. 2. Moderate arthritic change. Electronically Signed   By: Anner Crete M.D.   On: 02/04/2021 01:24   DG Knee Complete 4 Views Right  Result Date: 02/04/2021 CLINICAL DATA:  No knee pain. EXAM: RIGHT KNEE - COMPLETE 4+ VIEW COMPARISON:  None. FINDINGS: There is no acute fracture or dislocation. The bones are osteopenic. Severe arthritic changes of the right knee with tricompartmental narrowing and spurring. Small suprapatellar effusion. The soft tissues are unremarkable IMPRESSION: 1. No acute fracture or dislocation. 2. Severe arthritic changes of the right knee. Electronically Signed   By: Anner Crete M.D.   On: 02/04/2021 01:23      PROCEDURES:  Critical Care performed: Yes, see critical care procedure note(s)  .Critical Care Performed by: Rudene Re, MD Authorized by: Rudene Re, MD   Critical care provider statement:    Critical care time (minutes):  40   Critical care was necessary to treat or prevent imminent or life-threatening deterioration of the following conditions:  Cardiac failure, renal failure, respiratory failure, circulatory failure, CNS failure or compromise, sepsis, shock and metabolic crisis   Critical care was time spent personally by me on the following activities:  Development of treatment plan with patient or surrogate, discussions with consultants, evaluation of patient's  response to treatment, examination of patient, ordering and review of laboratory studies, ordering and review of radiographic studies, ordering and performing treatments and interventions, pulse oximetry, re-evaluation of patient's condition and review of old charts   I assumed direction of critical care for this patient from another provider in my specialty: no     Care discussed with: admitting provider      IMPRESSION / MDM / [place] / ED COURSE  I reviewed the triage vital signs and the nursing notes.  88 y.o. female with a history of atrial fibrillation on Eliquis, hypertension, hyperlipidemia who presents for evaluation of fall.  Patient had a fall 2 days ago  with head trauma.  Complaining of right knee pain.  She did hit her head but no LOC.  On exam she also is noted to be dyspneic and tachypneic.  She reports that over the last week she has been feeling more short of breath with ambulation.  She denies a cough.  She has increased work of breathing with normal sats and clear lungs.  There is no signs of trauma on exam with full painless range of motion of all the joints in bilateral lower extremities.  No spine tenderness.  Ddx: fall versus syncope versus dizziness.  Also for the shortness of breath my differential diagnosis includes pneumonia versus COVID versus flu versus pericardial effusion versus PE versus edema versus anemia versus metabolic acidosis   Plan: CT head and cervical spine, x-ray and Doppler studies of the right lower extremity to rule out a DVT, CBC, metabolic panel, urinalysis, COVID and flu swabs, troponin.  Patient placed on telemetry for close monitoring of cardiorespiratory status.   MEDICATIONS GIVEN IN ED: Medications  sodium zirconium cyclosilicate (LOKELMA) packet 10 g (has no administration in time range)  calcium gluconate 10 % injection (has no administration in time range)  lactated ringers infusion (has no administration in time range)   levofloxacin (LEVAQUIN) IVPB 750 mg (has no administration in time range)  magnesium sulfate IVPB 2 g 50 mL (0 g Intravenous Stopped 02/04/21 0142)  lactated ringers bolus 500 mL (0 mLs Intravenous Stopped 02/04/21 0149)  albuterol (PROVENTIL) (2.5 MG/3ML) 0.083% nebulizer solution 10 mg (10 mg Nebulization Given 02/04/21 0157)  insulin aspart (novoLOG) injection 5 Units (5 Units Intravenous Given 02/04/21 0157)    And  dextrose 50 % solution 50 mL (50 mLs Intravenous Given 02/04/21 0157)  sodium bicarbonate injection 50 mEq (50 mEq Intravenous Given 02/04/21 0157)  furosemide (LASIX) injection 20 mg (20 mg Intravenous Given 02/04/21 0157)  calcium gluconate 1 g/ 50 mL sodium chloride IVPB (0 mg Intravenous Stopped 02/04/21 0420)  acetaminophen (TYLENOL) tablet 1,000 mg (1,000 mg Oral Given 02/04/21 0226)  ipratropium-albuterol (DUONEB) 0.5-2.5 (3) MG/3ML nebulizer solution 6 mL (6 mLs Nebulization Given 02/04/21 0236)  furosemide (LASIX) injection 20 mg (20 mg Intravenous Given 02/04/21 0311)  iohexol (OMNIPAQUE) 350 MG/ML injection 50 mL (50 mLs Intravenous Contrast Given 02/04/21 0349)     ED COURSE: Imaging studies with no signs of acute traumatic injury.  Chemistry panel showing hyperkalemia with no EKG changes and acute kidney injury.  She is in A. fib with rate between 90 and low 100s.  She received IV fluids and IV magnesium for her A. fib with mild hypotension.  Once chemistry panel resulted with a K of 7.4 she was started on albuterol, bicarb, D50, insulin, Lasix, calcium, Lokelma.  COVID and flu negative with a chest x-ray showing no pneumonia or edema, no signs of metabolic acidosis.  Unclear etiology of patient's shortness of breath and dyspnea.  No change in her respiratory rate or work of breathing after albuterol.  Her initial troponin is elevated at 32 raising my suspicion for possible PE or pericardial effusion.  Will get a CTA of the chest.  She also has a white count of 17.3 which could be  just stress in the setting of a fall but also could be may be from a hidden pneumonia that the CT will help [place] identify.  Son is now at bedside and provides some additional history.  Patient remains on telemetry for close monitoring.  With abnormal kidney function, hyperkalemia,  shortness of breath patient is to be admitted to the hospital.  Hospitalist service was consulted and after discussion has accepted patient to their service   _________________________ 2:59 AM on 02/04/2021 ----------------------------------------- Patient is breathing got worse with wheezing and increased work of breathing.  She was given 2 DuoNeb's and transition to BiPAP.  Repeat chest x-ray concerning for may be worsening edema therefore another 20 mg of Lasix IV was given.  Patient has started to diurese.  She looks markedly more comfortable on BiPAP.  Her repeat troponin is trending down.  Her venous blood gas shows no signs of metabolic acidosis although the PO2 was very low at 31.  We will repeat an ABG after patient has been an hour on BiPAP.  Ultrasound venous Doppler negative for DVT.  Will pursue CT once patient's breathing status is improved and she can lay flat.  _________________________ 4:24 AM on 02/04/2021 ----------------------------------------- CTA negative for PE or pericardial effusion but does seem to show pneumonia.  With tachycardia, tachypnea and elevated white count patient met sepsis criteria.  She is allergic to cephalexin therefore we will start her on Levaquin.  We will hold off on more fluid boluses due to her worsening respiratory status after she received initial bolus.  We will start her on 125 cc an hour of maintenance fluids.  I consulted the hospitalist service and updated them on patient's status.   Consults: Hospitalist   EMR reviewed including patient's last visit with her PCP 2 weeks ago where she was seen for dementia and hypertension and A. fib       FINAL CLINICAL  IMPRESSION(S) / ED DIAGNOSES   Final diagnoses:  Fall, initial encounter  Sepsis with acute hypoxic respiratory failure without septic shock, due to unspecified organism [facility])  Community acquired pneumonia, unspecified laterality  AKI (acute kidney injury) ([place])     Rx / DC Orders   ED Discharge Orders     None        Note:  This document was prepared using Dragon voice recognition software and may include unintentional dictation errors.    Alfred Levins, [place], MD 02/04/21 [phone]

## 2021-02-04 NOTE — ED Notes (Signed)
Pt reports feeling good off bipap  O2 sats 98% and RR 19-22   Pt provided meal tray and is able to feed herself at this time

## 2021-02-04 NOTE — Progress Notes (Signed)
Patient noted to be off bipap on room air in no distress.

## 2021-02-04 NOTE — Progress Notes (Signed)
°  PROGRESS NOTE  Patient admitted earlier this morning. See H&P.   Vanessa Logan is a 88 yo female who presented after a fall and right-sided hip pain.  Work-up revealed multifocal pneumonia, patient required BiPAP.  Patient was evaluated in the emergency department, she denied any worsening shortness of breath, cough, fevers.  She was on BiPAP during evaluation without respiratory distress or accessory muscle use.  Continue IV antibiotics, Rocephin and azithromycin Right hip x-ray negative for fracture Hyperkalemia resolved Continue to monitor WBC as well as creatinine Trial off BiPAP today     Status is: Inpatient  Remains inpatient appropriate because: requiring BiPAP        Noralee Stain, DO [facility] 02/04/2021, 1:26 PM  Available via Epic secure chat 7am-7pm After these hours, please refer to coverage provider listed on [email or web address]

## 2021-02-05 ENCOUNTER — Encounter: Payer: Self-pay | Admitting: Internal Medicine

## 2021-02-05 DIAGNOSIS — F0390 Unspecified dementia without behavioral disturbance: Secondary | ICD-10-CM

## 2021-02-05 DIAGNOSIS — J9601 Acute respiratory failure with hypoxia: Secondary | ICD-10-CM | POA: Diagnosis not present

## 2021-02-05 LAB — URINE CULTURE: Culture: NO GROWTH

## 2021-02-05 LAB — BASIC METABOLIC PANEL
Anion gap: 9 (ref 5–15)
BUN: 50 mg/dL — ABNORMAL HIGH (ref 8–23)
CO2: 23 mmol/L (ref 22–32)
Calcium: 7.7 mg/dL — ABNORMAL LOW (ref 8.9–10.3)
Chloride: 102 mmol/L (ref 98–111)
Creatinine, Ser: 1.34 mg/dL — ABNORMAL HIGH (ref 0.44–1.00)
GFR, Estimated: 37 mL/min — ABNORMAL LOW (ref >60)
Glucose, Bld: 89 mg/dL (ref 70–99)
Potassium: 2.9 mmol/L — ABNORMAL LOW (ref 3.5–5.1)
Sodium: 134 mmol/L — ABNORMAL LOW (ref 135–145)

## 2021-02-05 LAB — CBC
HCT: 31.9 % — ABNORMAL LOW (ref 36.0–46.0)
Hemoglobin: 10.8 g/dL — ABNORMAL LOW (ref 12.0–15.0)
MCH: 27.6 pg (ref 26.0–34.0)
MCHC: 33.9 g/dL (ref 30.0–36.0)
MCV: 81.6 fL (ref 80.0–100.0)
Platelets: 147 10*3/uL — ABNORMAL LOW (ref 150–400)
RBC: 3.91 MIL/uL (ref 3.87–5.11)
RDW: 14.2 % (ref 11.5–15.5)
WBC: 11.4 10*3/uL — ABNORMAL HIGH (ref 4.0–10.5)
nRBC: 0 % (ref 0.0–0.2)

## 2021-02-05 LAB — PROTIME-INR
INR: 2.4 — ABNORMAL HIGH (ref 0.8–1.2)
Prothrombin Time: 26.4 seconds — ABNORMAL HIGH (ref 11.4–15.2)

## 2021-02-05 MED ORDER — POTASSIUM CHLORIDE CRYS ER 20 MEQ PO TBCR
40.0000 meq | EXTENDED_RELEASE_TABLET | ORAL | Status: AC
  Administered 2021-02-05 (×2): 40 meq via ORAL
  Filled 2021-02-05 (×2): qty 2

## 2021-02-05 MED ORDER — HALOPERIDOL 0.5 MG PO TABS
0.5000 mg | ORAL_TABLET | Freq: Two times a day (BID) | ORAL | Status: DC
  Administered 2021-02-05 – 2021-02-12 (×14): 0.5 mg via ORAL
  Filled 2021-02-05 (×18): qty 1

## 2021-02-05 NOTE — ED Notes (Signed)
ED TO INPATIENT HANDOFF REPORT  ED Nurse Name and Phone #:   S Name/Age/Gender Odie Sera 88 y.o. female Room/Bed: ED14A/ED14A  Code Status   Code Status: Full Code  Home/SNF/Other Home Patient oriented to: self, place, time, and situation Is this baseline? Yes   Triage Complete: Triage complete  Chief Complaint Severe sepsis (HCC) [A41.9, R65.20] Multifocal pneumonia [J18.9]  Triage Note Pt arrived via [facility] from home with reports of multiple falls, last fell 2 days ago, c/o pain. Pt currently on eliquis, hx of afib. Pt lives home alone.   P-76 97% 102/56  Pt arrived via [facility] from home, pt states she has had frequent falls at home and lives alone, pt states she uses a rolling walker, states on Sunday she was up going to the bathroom, pt states she got up in the dark and fell, pt states her legs gave out.  Pt states she has hit her head, denies any neck pain at this time.   PT denies any dizziness, denies any LOC.  Pt is alert and oriented x 4.  Pt takes Eliquis for afib.   Allergies Allergies  Allergen Reactions   Neosporin [Neomycin-Bacitracin Zn-Polymyx] Itching and Swelling   Keflex [Cephalexin] Rash    Level of Care/Admitting Diagnosis ED Disposition     ED Disposition  Admit   Condition  --   Comment  Hospital Area: [facility] [[id number]]  Level of Care: Progressive [102]  Admit to Progressive based on following criteria: RESPIRATORY PROBLEMS hypoxemic/hypercapnic respiratory failure that is responsive to NIPPV (BiPAP) or High Flow Nasal Cannula (6-80 lpm). Frequent assessment/intervention, no > Q2 hrs < Q4 hrs, to maintain oxygenation and pulmonary hygiene.  Covid Evaluation: Confirmed COVID Negative  Diagnosis: Multifocal pneumonia [1610960]  Admitting Physician: Noralee Stain [[id number]]  Attending Physician: Noralee Stain [phone]  Estimated length of stay: past midnight tomorrow  Certification:: I certify this patient  will need inpatient services for at least 2 midnights          B Medical/Surgery History Past Medical History:  Diagnosis Date   Atrial fibrillation (HCC)    Breast screening, unspecified 06/18/11   Diffuse cystic mastopathy    Family history of malignant neoplasm of breast 2013   Obesity, unspecified 2013   Screening for obesity 2013   Special screening for malignant neoplasms, colon 06/21/2011   Unspecified essential hypertension    Past Surgical History:  Procedure Laterality Date   APPENDECTOMY  1950   BREAST BIOPSY Bilateral 1992   neg   BREAST BIOPSY Right 1996   neg   BREAST BIOPSY Right 2005   neg   COLONOSCOPY  2007   COLONOSCOPY  2013   Dr Ricki Rodriguez   COLONOSCOPY W/ POLYPECTOMY  2004   ORIF WRIST FRACTURE Left 04/15/2020   Procedure: OPEN REDUCTION INTERNAL FIXATION LEFT DISTAL RADIUS FRACTION;  Surgeon: Kennedy Bucker, MD;  Location: [facility] ORS;  Service: Orthopedics;  Laterality: Left;   VARICOSE VEIN SURGERY  1983     A IV Location/Drains/Wounds Patient Lines/Drains/Airways Status     Active Line/Drains/Airways     Name Placement date Placement time Site Days   Peripheral IV 02/04/21 20 G Right Antecubital 02/04/21  0037  Antecubital  1   Peripheral IV 02/04/21 20 G Left Antecubital 02/04/21  0241  Antecubital  1   Incision (Closed) 04/15/20 Arm Left 04/15/20  1635  -- 296            Intake/Output Last 24 hours  Intake/Output Summary (Last 24 hours) at 02/05/2021 0009 Last data filed at 02/04/2021 1800 Gross per 24 hour  Intake 1300 ml  Output 3200 ml  Net -1900 ml    Labs/Imaging Results for orders placed or performed during the hospital encounter of 02/03/21 (from the past 48 hour(s))  CBC     Status: Abnormal   Collection Time: 02/03/21 11:34 PM  Result Value Ref Range   WBC 17.3 (H) 4.0 - 10.5 K/uL   RBC 4.39 3.87 - 5.11 MIL/uL   Hemoglobin 11.9 (L) 12.0 - 15.0 g/dL   HCT 96.0 45.4 - 09.8 %   MCV 84.3 80.0 - 100.0 fL   MCH 27.1 26.0 -  34.0 pg   MCHC 32.2 30.0 - 36.0 g/dL   RDW 11.9 14.7 - 82.9 %   Platelets 176 150 - 400 K/uL   nRBC 0.0 0.0 - 0.2 %    Comment: Performed at [facility], [address], [place], [place] [address]  Troponin I (High Sensitivity)     Status: Abnormal   Collection Time: 02/03/21 11:34 PM  Result Value Ref Range   Troponin I (High Sensitivity) 32 (H) <18 ng/L    Comment: (NOTE) Elevated high sensitivity troponin I (hsTnI) values and significant  changes across serial measurements may suggest ACS but many other  chronic and acute conditions are known to elevate hsTnI results.  Refer to the "Links" section for chest pain algorithms and additional  guidance. Performed at [facility], [address] [place], [place] [address]   Resp Panel by RT-PCR (Flu A&B, Covid) Nasopharyngeal Swab     Status: None   Collection Time: 02/03/21 11:34 PM   Specimen: Nasopharyngeal Swab; Nasopharyngeal(NP) swabs in vial transport medium  Result Value Ref Range   SARS Coronavirus 2 by RT PCR NEGATIVE NEGATIVE    Comment: (NOTE) SARS-CoV-2 target nucleic acids are NOT DETECTED.  The SARS-CoV-2 RNA is generally detectable in upper respiratory specimens during the acute phase of infection. The lowest concentration of SARS-CoV-2 viral copies this assay can detect is 138 copies/mL. A negative result does not preclude SARS-Cov-2 infection and should not be used as the sole basis for treatment or other patient management decisions. A negative result may occur with  improper specimen collection/handling, submission of specimen other than nasopharyngeal swab, presence of viral mutation(s) within the areas targeted by this assay, and inadequate number of viral copies(<138 copies/mL). A negative result must be combined with clinical observations, patient history, and epidemiological information. The expected result is Negative.  Fact Sheet for Patients:   BloggerCourse.com  Fact Sheet for Healthcare Providers:  SeriousBroker.it  This test is no t yet approved or cleared by the [place] FDA and  has been authorized for detection and/or diagnosis of SARS-CoV-2 by FDA under an Emergency Use Authorization (EUA). This EUA will remain  in effect (meaning this test can be used) for the duration of the COVID-19 declaration under Section 564(b)(1) of the Act, 21 U.S.C.section 360bbb-3(b)(1), unless the authorization is terminated  or revoked sooner.       Influenza A by PCR NEGATIVE NEGATIVE   Influenza B by PCR NEGATIVE NEGATIVE    Comment: (NOTE) The Xpert Xpress SARS-CoV-2/FLU/RSV plus assay is intended as an aid in the diagnosis of influenza from Nasopharyngeal swab specimens and should not be used as a sole basis for treatment. Nasal washings and aspirates are unacceptable for Xpert Xpress SARS-CoV-2/FLU/RSV testing.  Fact Sheet for Patients: BloggerCourse.com  Fact Sheet  for Healthcare Providers: SeriousBroker.it  This test is not yet approved or cleared by the [place] and has been authorized for detection and/or diagnosis of SARS-CoV-2 by FDA under an Emergency Use Authorization (EUA). This EUA will remain in effect (meaning this test can be used) for the duration of the COVID-19 declaration under Section 564(b)(1) of the Act, 21 U.S.C. section 360bbb-3(b)(1), unless the authorization is terminated or revoked.  Performed at [facility], [address] [place], [place] [address]   Comprehensive metabolic panel     Status: Abnormal   Collection Time: 02/03/21 11:34 PM  Result Value Ref Range   Sodium 125 (L) 135 - 145 mmol/L   Potassium 7.4 (HH) 3.5 - 5.1 mmol/L    Comment: CRITICAL RESULT CALLED TO, READ BACK BY AND VERIFIED WITH AUSTIN REEVES@0046  02/04/21 RH    Chloride 105 98 - 111 mmol/L   CO2  15 (L) 22 - 32 mmol/L   Glucose, Bld 106 (H) 70 - 99 mg/dL    Comment: Glucose reference range applies only to samples taken after fasting for at least 8 hours.   BUN 64 (H) 8 - 23 mg/dL   Creatinine, Ser 6.04 (H) 0.44 - 1.00 mg/dL   Calcium 8.7 (L) 8.9 - 10.3 mg/dL   Total Protein 6.6 6.5 - 8.1 g/dL   Albumin 2.7 (L) 3.5 - 5.0 g/dL   AST 24 15 - 41 U/L   ALT 20 0 - 44 U/L   Alkaline Phosphatase 107 38 - 126 U/L   Total Bilirubin 1.4 (H) 0.3 - 1.2 mg/dL   GFR, Estimated 33 (L) >60 mL/min    Comment: (NOTE) Calculated using the CKD-EPI Creatinine Equation (2021)    Anion gap 5 5 - 15    Comment: Performed at [facility], [address] [place], [place] [address]  Lipase, blood     Status: None   Collection Time: 02/03/21 11:34 PM  Result Value Ref Range   Lipase 34 11 - 51 U/L    Comment: Performed at [facility], [address] [place], [place] [address]  TSH     Status: None   Collection Time: 02/03/21 11:34 PM  Result Value Ref Range   TSH 2.956 0.350 - 4.500 uIU/mL    Comment: Performed by a 3rd Generation assay with a functional sensitivity of <=0.01 uIU/mL. Performed at [facility], [address] [place], [place] [address]   T4, free     Status: Abnormal   Collection Time: 02/03/21 11:34 PM  Result Value Ref Range   Free T4 1.65 (H) 0.61 - 1.12 ng/dL    Comment: (NOTE) Biotin ingestion may interfere with free T4 tests. If the results are inconsistent with the TSH level, previous test results, or the clinical presentation, then consider biotin interference. If needed, order repeat testing after stopping biotin. Performed at [facility], [address] [place], [place] [address]   Brain natriuretic peptide     Status: Abnormal   Collection Time: 02/03/21 11:34 PM  Result Value Ref Range   B Natriuretic Peptide 494.2 (H) 0.0 - 100.0 pg/mL    Comment: Performed at [facility], [address] [place],  [place] [address]  Blood gas, venous     Status: Abnormal   Collection Time: 02/04/21  1:41 AM  Result Value Ref Range   pH, Ven 7.32 7.250 - 7.430   pCO2, Ven 41 (L) 44.0 - 60.0 mmHg   pO2,  Ven <31.0 (LL) 32.0 - 45.0 mmHg   Bicarbonate 21.1 20.0 - 28.0 mmol/L   Acid-base deficit 4.7 (H) 0.0 - 2.0 mmol/L   O2 Saturation 34.2 %   Patient temperature 37.0    Collection site VEIN    Sample type VENOUS     Comment: Performed at [facility], [address], [place], [place] [address]  Troponin I (High Sensitivity)     Status: Abnormal   Collection Time: 02/04/21  2:06 AM  Result Value Ref Range   Troponin I (High Sensitivity) 29 (H) <18 ng/L    Comment: (NOTE) Elevated high sensitivity troponin I (hsTnI) values and significant  changes across serial measurements may suggest ACS but many other  chronic and acute conditions are known to elevate hsTnI results.  Refer to the "Links" section for chest pain algorithms and additional  guidance. Performed at [facility], [address] [place], [place] [address]   Lactic acid, plasma     Status: Abnormal   Collection Time: 02/04/21  4:40 AM  Result Value Ref Range   Lactic Acid, Venous 3.9 (HH) 0.5 - 1.9 mmol/L    Comment: CRITICAL RESULT CALLED TO, READ BACK BY AND VERIFIED WITH AUSTIN REEVES@0511  02/04/21 RH Performed at [facility], [address] [place], [place] [address]   Culture, blood (single)     Status: None (Preliminary result)   Collection Time: 02/04/21  4:40 AM   Specimen: BLOOD  Result Value Ref Range   Specimen Description BLOOD RIGHT ANTECUBITAL    Special Requests      BOTTLES DRAWN AEROBIC AND ANAEROBIC Blood Culture adequate volume   Culture  Setup Time      GRAM NEGATIVE RODS IN BOTH AEROBIC AND ANAEROBIC BOTTLES Organism ID to follow CRITICAL RESULT CALLED TO, READ BACK BY AND VERIFIED WITH: LISA KLUTTZ AT 1543 ON 02/04/21 BY SS Performed at [facility], [address] [place], [place] [address]    Culture GRAM NEGATIVE RODS    Report Status PENDING   Comprehensive metabolic panel     Status: Abnormal   Collection Time: 02/04/21  4:40 AM  Result Value Ref Range   Sodium 129 (L) 135 - 145 mmol/L   Potassium 5.5 (H) 3.5 - 5.1 mmol/L   Chloride 104 98 - 111 mmol/L   CO2 18 (L) 22 - 32 mmol/L   Glucose, Bld 115 (H) 70 - 99 mg/dL    Comment: Glucose reference range applies only to samples taken after fasting for at least 8 hours.   BUN 63 (H) 8 - 23 mg/dL   Creatinine, Ser 8.41 (H) 0.44 - 1.00 mg/dL   Calcium 8.6 (L) 8.9 - 10.3 mg/dL   Total Protein 5.7 (L) 6.5 - 8.1 g/dL   Albumin 2.4 (L) 3.5 - 5.0 g/dL   AST 25 15 - 41 U/L   ALT 19 0 - 44 U/L   Alkaline Phosphatase 106 38 - 126 U/L   Total Bilirubin 1.2 0.3 - 1.2 mg/dL   GFR, Estimated 33 (L) >60 mL/min    Comment: (NOTE) Calculated using the CKD-EPI Creatinine Equation (2021)    Anion gap 7 5 - 15    Comment: Performed at [facility], [address] [place], [place] [address]  Protime-INR     Status: Abnormal   Collection Time: 02/04/21  4:40 AM  Result Value Ref Range   Prothrombin Time 24.0 (H) 11.4 - 15.2 seconds   INR 2.2 (H)  0.8 - 1.2    Comment: (NOTE) INR goal varies based on device and disease states. Performed at [facility], [address] [place], [place] [address]   Cortisol-am, blood     Status: Abnormal   Collection Time: 02/04/21  4:40 AM  Result Value Ref Range   Cortisol - AM 33.4 (H) 6.7 - 22.6 ug/dL    Comment: Performed at [facility], [address] [place], [place] [address]  Procalcitonin     Status: None   Collection Time: 02/04/21  4:40 AM  Result Value Ref Range   Procalcitonin 8.71 ng/mL    Comment:        Interpretation: PCT > 2 ng/mL: Systemic infection (sepsis) is likely, unless other causes are known. (NOTE)       Sepsis PCT Algorithm           Lower Respiratory Tract                                      Infection PCT  Algorithm    ----------------------------     ----------------------------         PCT < 0.25 ng/mL                PCT < 0.10 ng/mL          Strongly encourage             Strongly discourage   discontinuation of antibiotics    initiation of antibiotics    ----------------------------     -----------------------------       PCT 0.25 - 0.50 ng/mL            PCT 0.10 - 0.25 ng/mL               OR       >80% decrease in PCT            Discourage initiation of                                            antibiotics      Encourage discontinuation           of antibiotics    ----------------------------     -----------------------------         PCT >= 0.50 ng/mL              PCT 0.26 - 0.50 ng/mL               AND       <80% decrease in PCT              Encourage initiation of                                             antibiotics       Encourage continuation           of antibiotics    ----------------------------     -----------------------------        PCT >= 0.50 ng/mL                  PCT > 0.50 ng/mL  AND         increase in PCT                  Strongly encourage                                      initiation of antibiotics    Strongly encourage escalation           of antibiotics                                     -----------------------------                                           PCT <= 0.25 ng/mL                                                 OR                                        > 80% decrease in PCT                                      Discontinue / Do not initiate                                             antibiotics  Performed at [facility] [address] [place], [place] [address]   CBC     Status: Abnormal   Collection Time: 02/04/21  4:40 AM  Result Value Ref Range   WBC 16.2 (H) 4.0 - 10.5 K/uL   RBC 3.91 3.87 - 5.11 MIL/uL   Hemoglobin 10.8 (L) 12.0 - 15.0 g/dL   HCT 60.4 (L) 54.0 - 98.1 %   MCV 85.4 80.0 - 100.0 fL    MCH 27.6 26.0 - 34.0 pg   MCHC 32.3 30.0 - 36.0 g/dL   RDW 19.1 47.8 - 29.5 %   Platelets 153 150 - 400 K/uL   nRBC 0.0 0.0 - 0.2 %    Comment: Performed at [facility], [address], [place], [place] [address]  Blood Culture ID Panel (Reflexed)     Status: Abnormal   Collection Time: 02/04/21  4:40 AM  Result Value Ref Range   Enterococcus faecalis NOT DETECTED NOT DETECTED   Enterococcus Faecium NOT DETECTED NOT DETECTED   Listeria monocytogenes NOT DETECTED NOT DETECTED   Staphylococcus species NOT DETECTED NOT DETECTED   Staphylococcus aureus (BCID) NOT DETECTED NOT DETECTED   Staphylococcus epidermidis NOT DETECTED NOT DETECTED   Staphylococcus lugdunensis NOT DETECTED NOT DETECTED   Streptococcus species NOT DETECTED NOT DETECTED   Streptococcus agalactiae NOT DETECTED NOT DETECTED   Streptococcus pneumoniae NOT DETECTED NOT DETECTED   Streptococcus pyogenes NOT DETECTED NOT  DETECTED   A.calcoaceticus-baumannii NOT DETECTED NOT DETECTED   Bacteroides fragilis NOT DETECTED NOT DETECTED   Enterobacterales DETECTED (A) NOT DETECTED    Comment: Enterobacterales represent a large order of gram negative bacteria, not a single organism. CRITICAL RESULT CALLED TO, READ BACK BY AND VERIFIED WITH: LISA KLUTTZ AT 1543 ON 02/04/21 BY SS    Enterobacter cloacae complex NOT DETECTED NOT DETECTED   Escherichia coli DETECTED (A) NOT DETECTED    Comment: CRITICAL RESULT CALLED TO, READ BACK BY AND VERIFIED WITH: LISA KLUTTZ AT 1543 ON 02/04/21 BY SS    Klebsiella aerogenes NOT DETECTED NOT DETECTED   Klebsiella oxytoca NOT DETECTED NOT DETECTED   Klebsiella pneumoniae NOT DETECTED NOT DETECTED   Proteus species NOT DETECTED NOT DETECTED   Salmonella species NOT DETECTED NOT DETECTED   Serratia marcescens NOT DETECTED NOT DETECTED   Haemophilus influenzae NOT DETECTED NOT DETECTED   Neisseria meningitidis NOT DETECTED NOT DETECTED   Pseudomonas aeruginosa NOT DETECTED NOT  DETECTED   Stenotrophomonas maltophilia NOT DETECTED NOT DETECTED   Candida albicans NOT DETECTED NOT DETECTED   Candida auris NOT DETECTED NOT DETECTED   Candida glabrata NOT DETECTED NOT DETECTED   Candida krusei NOT DETECTED NOT DETECTED   Candida parapsilosis NOT DETECTED NOT DETECTED   Candida tropicalis NOT DETECTED NOT DETECTED   Cryptococcus neoformans/gattii NOT DETECTED NOT DETECTED   CTX-M ESBL NOT DETECTED NOT DETECTED   Carbapenem resistance IMP NOT DETECTED NOT DETECTED   Carbapenem resistance KPC NOT DETECTED NOT DETECTED   Carbapenem resistance NDM NOT DETECTED NOT DETECTED   Carbapenem resist OXA 48 LIKE NOT DETECTED NOT DETECTED   Carbapenem resistance VIM NOT DETECTED NOT DETECTED    Comment: Performed at [facility], [address] [place], [place] [address]  Urinalysis, Routine w reflex microscopic     Status: Abnormal   Collection Time: 02/04/21  5:41 AM  Result Value Ref Range   Color, Urine YELLOW (A) YELLOW   APPearance CLEAR (A) CLEAR   Specific Gravity, Urine 1.008 1.005 - 1.030   pH 5.0 5.0 - 8.0   Glucose, UA NEGATIVE NEGATIVE mg/dL   Hgb urine dipstick MODERATE (A) NEGATIVE   Bilirubin Urine NEGATIVE NEGATIVE   Ketones, ur NEGATIVE NEGATIVE mg/dL   Protein, ur NEGATIVE NEGATIVE mg/dL   Nitrite NEGATIVE NEGATIVE   Leukocytes,Ua MODERATE (A) NEGATIVE   RBC / HPF 0-5 0 - 5 RBC/hpf   WBC, UA 11-20 0 - 5 WBC/hpf   Bacteria, UA RARE (A) NONE SEEN   Squamous Epithelial / LPF NONE SEEN 0 - 5    Comment: Performed at [facility], [address] [place], [place] [address]  Lactic acid, plasma     Status: Abnormal   Collection Time: 02/04/21  7:10 AM  Result Value Ref Range   Lactic Acid, Venous 4.1 (HH) 0.5 - 1.9 mmol/L    Comment: CRITICAL VALUE NOTED. VALUE IS CONSISTENT WITH PREVIOUSLY REPORTED/CALLED VALUE MW Performed at [facility], [address] [place], [place] [address]   Potassium     Status: None    Collection Time: 02/04/21  9:10 AM  Result Value Ref Range   Potassium 4.8 3.5 - 5.1 mmol/L    Comment: Performed at [facility], [address] [place], [place] [address]  Lactic acid, plasma     Status: None   Collection Time: 02/04/21 12:57 PM  Result Value Ref Range   Lactic Acid, Venous 1.8 0.5 - 1.9  mmol/L    Comment: Performed at [facility], [address] [place], [place] [address]   DG Chest 1 View  Result Date: 02/03/2021 CLINICAL DATA:  Fall, atrial fibrillation EXAM: CHEST  1 VIEW COMPARISON:  06/07/2014 FINDINGS: Mild cardiomegaly. Biapical scarring. Lungs otherwise clear. No effusions. No acute bony abnormality. IMPRESSION: Mild cardiomegaly.  No active disease. Electronically Signed   By: Charlett Nose M.D.   On: 02/03/2021 23:57   CT HEAD WO CONTRAST  Result Date: 02/03/2021 CLINICAL DATA:  Frequent falls.  Head trauma, moderate-severe EXAM: CT HEAD WITHOUT CONTRAST TECHNIQUE: Contiguous axial images were obtained from the base of the skull through the vertex without intravenous contrast. RADIATION DOSE REDUCTION: This exam was performed according to the departmental dose-optimization program which includes automated exposure control, adjustment of the mA and/or kV according to patient size and/or use of iterative reconstruction technique. COMPARISON:  12/25/2020 FINDINGS: Brain: No acute intracranial abnormality. Specifically, no hemorrhage, hydrocephalus, mass lesion, acute infarction, or significant intracranial injury. Vascular: No hyperdense vessel or unexpected calcification. Skull: No acute calvarial abnormality. Sinuses/Orbits: No acute findings Other: None IMPRESSION: No acute intracranial abnormality. Electronically Signed   By: Charlett Nose M.D.   On: 02/03/2021 23:55   CT Angio Chest PE W and/or Wo Contrast  Result Date: 02/04/2021 CLINICAL DATA:  Pulmonary embolism suspected.  Respiratory distress EXAM: CT ANGIOGRAPHY CHEST WITH CONTRAST  TECHNIQUE: Multidetector CT imaging of the chest was performed using the standard protocol during bolus administration of intravenous contrast. Multiplanar CT image reconstructions and MIPs were obtained to evaluate the vascular anatomy. RADIATION DOSE REDUCTION: This exam was performed according to the departmental dose-optimization program which includes automated exposure control, adjustment of the mA and/or kV according to patient size and/or use of iterative reconstruction technique. CONTRAST:  60mL OMNIPAQUE IOHEXOL 350 MG/ML SOLN COMPARISON:  06/08/2014 FINDINGS: Cardiovascular: Satisfactory opacification of the pulmonary arteries but suboptimal assessment due to motion artifact especially affecting the lobar and distal branches at the bases. No evidence of pulmonary embolism. Enlarged appearance of the atria. No pericardial effusion. Mediastinum/Nodes: Negative for adenopathy or mass. Lungs/Pleura: Subpleural reticulonodular density scattered in the bilateral lower lungs. Ground-glass opacity at the apices which is symmetric and is scarring based March 2022 cervical spine CT. Trace bilateral pleural fluid Upper Abdomen: No acute finding Musculoskeletal: Exaggerated thoracic kyphosis with multilevel bridging osteophyte. Review of the MIP images confirms the above findings. IMPRESSION: 1. Scattered reticulonodular opacities which appear infectious/inflammatory. These could be acute or chronic, please correlate for active pneumonia symptoms. 2. Mild cardiomegaly and trace pleural effusions. No pulmonary edema. 3. No evidence of pulmonary embolism but multifocal vessel obscuration from motion artifact. Electronically Signed   By: Tiburcio Pea M.D.   On: 02/04/2021 04:12   CT Cervical Spine Wo Contrast  Result Date: 02/03/2021 CLINICAL DATA:  Neck trauma (Age >= 65y).  Fall. EXAM: CT CERVICAL SPINE WITHOUT CONTRAST TECHNIQUE: Multidetector CT imaging of the cervical spine was performed without intravenous  contrast. Multiplanar CT image reconstructions were also generated. RADIATION DOSE REDUCTION: This exam was performed according to the departmental dose-optimization program which includes automated exposure control, adjustment of the mA and/or kV according to patient size and/or use of iterative reconstruction technique. COMPARISON:  04/05/2020 FINDINGS: Alignment: Normal Skull base and vertebrae: No acute fracture. No primary bone lesion or focal pathologic process. Soft tissues and spinal canal: No prevertebral fluid or swelling. No visible canal hematoma. Disc levels: Mild degenerative disc disease, most pronounced at C5-6 and C6-7.  Moderate bilateral degenerative facet disease. Upper chest: Biapical scarring.  No acute findings Other: None IMPRESSION: Degenerative disc and facet disease. No acute bony abnormality. Electronically Signed   By: Charlett Nose M.D.   On: 02/03/2021 23:57   US Venous Img Lower Unilateral Right  Result Date: 02/04/2021 CLINICAL DATA:  Right lower extremity pain. EXAM: RIGHT LOWER EXTREMITY VENOUS DOPPLER ULTRASOUND TECHNIQUE: Gray-scale sonography with compression, as well as color and duplex ultrasound, were performed to evaluate the deep venous system(s) from the level of the common femoral vein through the popliteal and proximal calf veins. COMPARISON:  Similar study 02/24/2016 FINDINGS: VENOUS Normal compressibility of the common femoral, superficial femoral, and popliteal veins, as well as the visualized calf veins. Visualized portions of profunda femoral vein and great saphenous vein unremarkable. No filling defects to suggest DVT on grayscale or color Doppler imaging. Doppler waveforms show normal direction of venous flow, normal respiratory plasticity and response to augmentation. Limited views of the contralateral common femoral vein are unremarkable. OTHER None. Limitations: none IMPRESSION: Negative. Comparison to prior study reveals no significant interval change.  Electronically Signed   By: Almira Bar M.D.   On: 02/04/2021 02:41   DG Chest Portable 1 View  Result Date: 02/04/2021 CLINICAL DATA:  Shortness of breath. EXAM: PORTABLE CHEST 1 VIEW COMPARISON:  Chest radiograph dated 02/03/2021. FINDINGS: There is cardiomegaly. No focal consolidation, pleural effusion or pneumothorax. No acute osseous pathology. IMPRESSION: No acute cardiopulmonary process. Cardiomegaly. Electronically Signed   By: Elgie Collard M.D.   On: 02/04/2021 02:49   DG Knee Complete 4 Views Left  Result Date: 02/04/2021 CLINICAL DATA:  Left knee pain. EXAM: LEFT KNEE - COMPLETE 4+ VIEW COMPARISON:  None. FINDINGS: There is no acute fracture or dislocation. The bones are osteopenic. Moderate arthritic changes with tricompartmental narrowing. No joint effusion. The soft tissues are unremarkable. IMPRESSION: 1. No acute fracture or dislocation. 2. Moderate arthritic change. Electronically Signed   By: Elgie Collard M.D.   On: 02/04/2021 01:24   DG Knee Complete 4 Views Right  Result Date: 02/04/2021 CLINICAL DATA:  No knee pain. EXAM: RIGHT KNEE - COMPLETE 4+ VIEW COMPARISON:  None. FINDINGS: There is no acute fracture or dislocation. The bones are osteopenic. Severe arthritic changes of the right knee with tricompartmental narrowing and spurring. Small suprapatellar effusion. The soft tissues are unremarkable IMPRESSION: 1. No acute fracture or dislocation. 2. Severe arthritic changes of the right knee. Electronically Signed   By: Elgie Collard M.D.   On: 02/04/2021 01:23   DG HIP UNILAT WITH PELVIS 2-3 VIEWS RIGHT  Result Date: 02/04/2021 CLINICAL DATA:  Fall with right hip pain. EXAM: DG HIP (WITH OR WITHOUT PELVIS) 2-3V RIGHT COMPARISON:  None. FINDINGS: There is no evidence of hip fracture or dislocation. There is mild joint space loss at the right hip with trace acetabular osteophytes, mild enthesopathic changes of the pelvis. There is osteopenia. Right SI joint  unremarkable, as visualized. IMPRESSION: Osteopenia and degenerative change without evidence of fractures. Electronically Signed   By: Almira Bar M.D.   On: 02/04/2021 05:07    Pending Labs Unresulted Labs (From admission, onward)     Start     Ordered   02/05/21 0500  CBC  Tomorrow morning,   STAT        02/04/21 1328   02/05/21 0500  Basic metabolic panel  Tomorrow morning,   STAT        02/04/21 1328   02/05/21 0500  Protime-INR  Tomorrow morning,   STAT        02/04/21 1329   02/04/21 1605  Urine Culture  Once,   STAT       Question:  Indication  Answer:  Sepsis   02/04/21 1604            Vitals/Pain Today's Vitals   02/04/21 2130 02/04/21 2200 02/04/21 2223 02/04/21 2230  BP: 90/61 110/61 110/61 117/60  Pulse: 94  99 93  Resp:   19   Temp:   97.9 F (36.6 C)   TempSrc:   Oral   SpO2: 96% 99% 99% 96%  Weight:      Height:      PainSc:   0-No pain     Isolation Precautions No active isolations  Medications Medications  apixaban (ELIQUIS) tablet 5 mg (5 mg Oral Given 02/04/21 2223)  metoprolol succinate (TOPROL-XL) 24 hr tablet 12.5 mg (has no administration in time range)  acetaminophen (TYLENOL) tablet 650 mg (has no administration in time range)    Or  acetaminophen (TYLENOL) suppository 650 mg (has no administration in time range)  HYDROcodone-acetaminophen (NORCO/VICODIN) 5-325 MG per tablet 1-2 tablet (has no administration in time range)  ondansetron (ZOFRAN) tablet 4 mg (has no administration in time range)    Or  ondansetron (ZOFRAN) injection 4 mg (has no administration in time range)  azithromycin (ZITHROMAX) 500 mg in sodium chloride 0.9 % 250 mL IVPB (has no administration in time range)  lactated ringers infusion ( Intravenous New Bag/Given 02/04/21 1421)  cefTRIAXone (ROCEPHIN) 2 g in sodium chloride 0.9 % 100 mL IVPB (has no administration in time range)  magnesium sulfate IVPB 2 g 50 mL (0 g Intravenous Stopped 02/04/21 0142)  lactated  ringers bolus 500 mL (0 mLs Intravenous Stopped 02/04/21 0149)  albuterol (PROVENTIL) (2.5 MG/3ML) 0.083% nebulizer solution 10 mg (10 mg Nebulization Given 02/04/21 0157)  insulin aspart (novoLOG) injection 5 Units (5 Units Intravenous Given 02/04/21 0157)    And  dextrose 50 % solution 50 mL (50 mLs Intravenous Given 02/04/21 0157)  sodium bicarbonate injection 50 mEq (50 mEq Intravenous Given 02/04/21 0157)  furosemide (LASIX) injection 20 mg (20 mg Intravenous Given 02/04/21 0157)  calcium gluconate 1 g/ 50 mL sodium chloride IVPB (0 mg Intravenous Stopped 02/04/21 0420)  calcium gluconate 10 % injection (  Given 02/04/21 0457)  acetaminophen (TYLENOL) tablet 1,000 mg (1,000 mg Oral Given 02/04/21 0226)  ipratropium-albuterol (DUONEB) 0.5-2.5 (3) MG/3ML nebulizer solution 6 mL (6 mLs Nebulization Given 02/04/21 0236)  furosemide (LASIX) injection 20 mg (20 mg Intravenous Given 02/04/21 0311)  iohexol (OMNIPAQUE) 350 MG/ML injection 50 mL (50 mLs Intravenous Contrast Given 02/04/21 0349)  levofloxacin (LEVAQUIN) IVPB 750 mg (0 mg Intravenous Stopped 02/04/21 0640)  lactated ringers bolus 250 mL (0 mLs Intravenous Stopped 02/04/21 0529)  metoprolol succinate (TOPROL-XL) 24 hr tablet 25 mg (25 mg Oral Given 02/04/21 0456)  lactated ringers bolus 500 mL (0 mLs Intravenous Stopped 02/04/21 0640)    Mobility  High fall risk   Focused Assessments    R Recommendations: See Admitting Provider Note  Report given to:   Additional Notes:

## 2021-02-05 NOTE — Progress Notes (Signed)
PROGRESS NOTE    Vanessa Logan  VWU:[id number] DOB: 18-Mar-1926 DOA: 02/03/2021 PCP: Marguarite Arbour, MD     Brief Narrative:  Vanessa Logan is a 88 y.o. female with medical history significant for HTN, A. fib on Eliquis, dementia, who lives alone was brought to the ED initially for right hip pain following a fall off the commode 2 days prior.  She was able to get up and has been ambulating with her walker since.  States she hit her right hip and her head.  Patient denies being previously unwell prior to the fall.  Work-up has revealed multifocal pneumonia, as patient became increasingly tachypneic, she was started on BiPAP in the emergency department.    New events last 24 hours / Subjective: Patient remains on room air this morning.  Breakfast tray is in front of her, she has no physical complaints.  Specifically she denies any fevers, shaking chills, chest pain, cough, shortness of breath, abdominal pain, nausea, vomiting, diarrhea, dysuria.  Spoke with daughter over the phone today.  She states that patient typically never complains about anything, however patient was having severe episodes of diarrhea prior to hospitalization as well as weakness and shortness of breath.  Patient does have history of dementia, but currently lives at home alone and manages her own medications.  Assessment & Plan:   Principal Problem:   Acute respiratory failure (HCC) Active Problems:   Fall at home, initial encounter   AKI (acute kidney injury) (HCC)   Metabolic acidosis   Hyperkalemia   Right hip pain   Rapid atrial fibrillation (HCC)   Multifocal pneumonia   Sepsis (HCC)   Severe sepsis (HCC)   Severe sepsis secondary to E. coli bacteremia, multifocal pneumonia -Sepsis present on admission -CTA chest: Scattered reticulonodular opacities which appear infectious/inflammatory. No evidence of pulmonary embolism  -Patient remains on IV rocephin/azithromycin   Acute hypoxemic respiratory  failure -Required BiPAP for short period of time, now weaned off and on room air  AKI -Baseline creatinine 0.9 -Improving, continue IVF  -Hold HCTZ, cozaar   Hypokalemia -Replace, trend  A. fib RVR -Continue Eliquis, metoprolol  Fall at home -Right hip xray: Osteopenia and degenerative change without evidence of fractures. -PT ordered  Elevated INR -Patient is not on coumadin, has normal LFTs, ?etiology, she is on eliquis  -Repeat in AM   Dementia -Takes Haldol, currently does live at home alone   DVT prophylaxis:  apixaban (ELIQUIS) tablet 5 mg  Code Status: Full code Family Communication: Daughter over the phone Disposition Plan:  Status is: Inpatient  Remains inpatient appropriate because: Remains on IV antibiotics, IV fluids    Antimicrobials:  Anti-infectives (From admission, onward)    Start     Dose/Rate Route Frequency Ordered Stop   02/06/21 0800  azithromycin (ZITHROMAX) 500 mg in sodium chloride 0.9 % 250 mL IVPB        500 mg 250 mL/hr over 60 Minutes Intravenous Every 24 hours 02/04/21 1013 02/11/21 0759   02/06/21 0800  cefTRIAXone (ROCEPHIN) 2 g in sodium chloride 0.9 % 100 mL IVPB  Status:  Discontinued        2 g 200 mL/hr over 30 Minutes Intravenous Every 24 hours 02/04/21 1013 02/04/21 1612   02/05/21 0400  cefTRIAXone (ROCEPHIN) 2 g in sodium chloride 0.9 % 100 mL IVPB        2 g 200 mL/hr over 30 Minutes Intravenous Every 24 hours 02/04/21 1612     02/04/21 2300  azithromycin (ZITHROMAX) 500 mg in sodium chloride 0.9 % 250 mL IVPB  Status:  Discontinued        500 mg 250 mL/hr over 60 Minutes Intravenous Every 24 hours 02/04/21 0500 02/04/21 1013   02/04/21 2200  cefTRIAXone (ROCEPHIN) 1 g in sodium chloride 0.9 % 100 mL IVPB  Status:  Discontinued        1 g 200 mL/hr over 30 Minutes Intravenous Every 24 hours 02/04/21 0500 02/04/21 1013   02/04/21 0430  levofloxacin (LEVAQUIN) IVPB 750 mg        750 mg 100 mL/hr over 90 Minutes  Intravenous  Once 02/04/21 0422 02/04/21 0640        Objective: Vitals:   02/05/21 0100 02/05/21 0150 02/05/21 0404 02/05/21 0754  BP: 103/65 111/66 (!) 106/58 105/66  Pulse: (!) 103 100 (!) 103 93  Resp:  20 18 18   Temp:  99.2 F (37.3 C) 98.1 F (36.7 C) 98.6 F (37 C)  TempSrc:   Oral Oral  SpO2: 96% 99% 97% 94%  Weight:      Height:        Intake/Output Summary (Last 24 hours) at 02/05/2021 1155 Last data filed at 02/05/2021 1000 Gross per 24 hour  Intake 874.94 ml  Output 1650 ml  Net -775.06 ml   Filed Weights   02/03/21 2318  Weight: 72.6 kg    Examination:  General exam: Appears calm and comfortable  Respiratory system: Clear to auscultation. Respiratory effort normal. No respiratory distress. No conversational dyspnea. On room air  Cardiovascular system: S1 & S2 heard, irrg rhythm, tachy 105. No murmurs. No pedal edema. Gastrointestinal system: Abdomen is nondistended, soft and nontender. Normal bowel sounds heard. Central nervous system: Alert Extremities: Symmetric in appearance  Skin: No rashes, lesions or ulcers on exposed skin  Psychiatry: Mood & affect appropriate.   Data Reviewed: I have personally reviewed following labs and imaging studies  CBC: Recent Labs  Lab 02/03/21 2334 02/04/21 0440 02/05/21 0656  WBC 17.3* 16.2* 11.4*  HGB 11.9* 10.8* 10.8*  HCT 37.0 33.4* 31.9*  MCV 84.3 85.4 81.6  PLT 176 153 147*   Basic Metabolic Panel: Recent Labs  Lab 02/03/21 2334 02/04/21 0440 02/04/21 0910 02/05/21 0656  NA 125* 129*  --  134*  K 7.4* 5.5* 4.8 2.9*  CL 105 104  --  102  CO2 15* 18*  --  23  GLUCOSE 106* 115*  --  89  BUN 64* 63*  --  50*  CREATININE 1.49* 1.47*  --  1.34*  CALCIUM 8.7* 8.6*  --  7.7*   GFR: Estimated Creatinine Clearance: 27 mL/min (A) (by C-G formula based on SCr of 1.34 mg/dL (H)). Liver Function Tests: Recent Labs  Lab 02/03/21 2334 02/04/21 0440  AST 24 25  ALT 20 19  ALKPHOS 107 106  BILITOT 1.4*  1.2  PROT 6.6 5.7*  ALBUMIN 2.7* 2.4*   Recent Labs  Lab 02/03/21 2334  LIPASE 34   No results for input(s): AMMONIA in the last 168 hours. Coagulation Profile: Recent Labs  Lab 02/04/21 0440 02/05/21 0656  INR 2.2* 2.4*   Cardiac Enzymes: No results for input(s): CKTOTAL, CKMB, CKMBINDEX, TROPONINI in the last 168 hours. BNP (last 3 results) No results for input(s): PROBNP in the last 8760 hours. HbA1C: No results for input(s): HGBA1C in the last 72 hours. CBG: No results for input(s): GLUCAP in the last 168 hours. Lipid Profile: No results for input(s): CHOL, HDL,  LDLCALC, TRIG, CHOLHDL, LDLDIRECT in the last 72 hours. Thyroid Function Tests: Recent Labs    02/03/21 2334  TSH 2.956  FREET4 1.65*   Anemia Panel: No results for input(s): VITAMINB12, FOLATE, FERRITIN, TIBC, IRON, RETICCTPCT in the last 72 hours. Sepsis Labs: Recent Labs  Lab 02/04/21 0440 02/04/21 0710 02/04/21 1257  PROCALCITON 8.71  --   --   LATICACIDVEN 3.9* 4.1* 1.8    Recent Results (from the past 240 hour(s))  Resp Panel by RT-PCR (Flu A&B, Covid) Nasopharyngeal Swab     Status: None   Collection Time: 02/03/21 11:34 PM   Specimen: Nasopharyngeal Swab; Nasopharyngeal(NP) swabs in vial transport medium  Result Value Ref Range Status   SARS Coronavirus 2 by RT PCR NEGATIVE NEGATIVE Final    Comment: (NOTE) SARS-CoV-2 target nucleic acids are NOT DETECTED.  The SARS-CoV-2 RNA is generally detectable in upper respiratory specimens during the acute phase of infection. The lowest concentration of SARS-CoV-2 viral copies this assay can detect is 138 copies/mL. A negative result does not preclude SARS-Cov-2 infection and should not be used as the sole basis for treatment or other patient management decisions. A negative result may occur with  improper specimen collection/handling, submission of specimen other than nasopharyngeal swab, presence of viral mutation(s) within the areas targeted  by this assay, and inadequate number of viral copies(<138 copies/mL). A negative result must be combined with clinical observations, patient history, and epidemiological information. The expected result is Negative.  Fact Sheet for Patients:  BloggerCourse.comhttps://www.fda.gov/media/152166/download  Fact Sheet for Healthcare Providers:  SeriousBroker.ithttps://www.fda.gov/media/152162/download  This test is no t yet approved or cleared by the [place] FDA and  has been authorized for detection and/or diagnosis of SARS-CoV-2 by FDA under an Emergency Use Authorization (EUA). This EUA will remain  in effect (meaning this test can be used) for the duration of the COVID-19 declaration under Section 564(b)(1) of the Act, 21 U.S.C.section 360bbb-3(b)(1), unless the authorization is terminated  or revoked sooner.       Influenza A by PCR NEGATIVE NEGATIVE Final   Influenza B by PCR NEGATIVE NEGATIVE Final    Comment: (NOTE) The Xpert Xpress SARS-CoV-2/FLU/RSV plus assay is intended as an aid in the diagnosis of influenza from Nasopharyngeal swab specimens and should not be used as a sole basis for treatment. Nasal washings and aspirates are unacceptable for Xpert Xpress SARS-CoV-2/FLU/RSV testing.  Fact Sheet for Patients: BloggerCourse.comhttps://www.fda.gov/media/152166/download  Fact Sheet for Healthcare Providers: SeriousBroker.ithttps://www.fda.gov/media/152162/download  This test is not yet approved or cleared by the [place] FDA and has been authorized for detection and/or diagnosis of SARS-CoV-2 by FDA under an Emergency Use Authorization (EUA). This EUA will remain in effect (meaning this test can be used) for the duration of the COVID-19 declaration under Section 564(b)(1) of the Act, 21 U.S.C. section 360bbb-3(b)(1), unless the authorization is terminated or revoked.  Performed at [facility], [address] [place], [place] [address]   Culture, blood (single)     Status: Abnormal (Preliminary result)    Collection Time: 02/04/21  4:40 AM   Specimen: BLOOD  Result Value Ref Range Status   Specimen Description   Final    BLOOD RIGHT ANTECUBITAL Performed at [facility], [address] [place], [place] [address]    Special Requests   Final    BOTTLES DRAWN AEROBIC AND ANAEROBIC Blood Culture adequate volume Performed at [facility], [address] [place], [place] [address]    Culture  Setup Time  Final    GRAM NEGATIVE RODS IN BOTH AEROBIC AND ANAEROBIC BOTTLES Organism ID to follow CRITICAL RESULT CALLED TO, READ BACK BY AND VERIFIED WITH: LISA KLUTTZ AT 1543 ON 02/04/21 BY SS Performed at [facility], [address] [place], [place] [address]    Culture (A)  Final    ESCHERICHIA COLI SUSCEPTIBILITIES TO FOLLOW Performed at [facility], [address] [place], [place] [address]    Report Status PENDING  Incomplete  Blood Culture ID Panel (Reflexed)     Status: Abnormal   Collection Time: 02/04/21  4:40 AM  Result Value Ref Range Status   Enterococcus faecalis NOT DETECTED NOT DETECTED Final   Enterococcus Faecium NOT DETECTED NOT DETECTED Final   Listeria monocytogenes NOT DETECTED NOT DETECTED Final   Staphylococcus species NOT DETECTED NOT DETECTED Final   Staphylococcus aureus (BCID) NOT DETECTED NOT DETECTED Final   Staphylococcus epidermidis NOT DETECTED NOT DETECTED Final   Staphylococcus lugdunensis NOT DETECTED NOT DETECTED Final   Streptococcus species NOT DETECTED NOT DETECTED Final   Streptococcus agalactiae NOT DETECTED NOT DETECTED Final   Streptococcus pneumoniae NOT DETECTED NOT DETECTED Final   Streptococcus pyogenes NOT DETECTED NOT DETECTED Final   A.calcoaceticus-baumannii NOT DETECTED NOT DETECTED Final   Bacteroides fragilis NOT DETECTED NOT DETECTED Final   Enterobacterales DETECTED (A) NOT DETECTED Final    Comment: Enterobacterales represent a large order of gram negative bacteria, not a single  organism. CRITICAL RESULT CALLED TO, READ BACK BY AND VERIFIED WITH: LISA KLUTTZ AT 1543 ON 02/04/21 BY SS    Enterobacter cloacae complex NOT DETECTED NOT DETECTED Final   Escherichia coli DETECTED (A) NOT DETECTED Final    Comment: CRITICAL RESULT CALLED TO, READ BACK BY AND VERIFIED WITH: LISA KLUTTZ AT 1543 ON 02/04/21 BY SS    Klebsiella aerogenes NOT DETECTED NOT DETECTED Final   Klebsiella oxytoca NOT DETECTED NOT DETECTED Final   Klebsiella pneumoniae NOT DETECTED NOT DETECTED Final   Proteus species NOT DETECTED NOT DETECTED Final   Salmonella species NOT DETECTED NOT DETECTED Final   Serratia marcescens NOT DETECTED NOT DETECTED Final   Haemophilus influenzae NOT DETECTED NOT DETECTED Final   Neisseria meningitidis NOT DETECTED NOT DETECTED Final   Pseudomonas aeruginosa NOT DETECTED NOT DETECTED Final   Stenotrophomonas maltophilia NOT DETECTED NOT DETECTED Final   Candida albicans NOT DETECTED NOT DETECTED Final   Candida auris NOT DETECTED NOT DETECTED Final   Candida glabrata NOT DETECTED NOT DETECTED Final   Candida krusei NOT DETECTED NOT DETECTED Final   Candida parapsilosis NOT DETECTED NOT DETECTED Final   Candida tropicalis NOT DETECTED NOT DETECTED Final   Cryptococcus neoformans/gattii NOT DETECTED NOT DETECTED Final   CTX-M ESBL NOT DETECTED NOT DETECTED Final   Carbapenem resistance IMP NOT DETECTED NOT DETECTED Final   Carbapenem resistance KPC NOT DETECTED NOT DETECTED Final   Carbapenem resistance NDM NOT DETECTED NOT DETECTED Final   Carbapenem resist OXA 48 LIKE NOT DETECTED NOT DETECTED Final   Carbapenem resistance VIM NOT DETECTED NOT DETECTED Final    Comment: Performed at [facility], [address] [place], [place] [address]      Radiology Studies: DG Chest 1 View  Result Date: 02/03/2021 CLINICAL DATA:  Fall, atrial fibrillation EXAM: CHEST  1 VIEW COMPARISON:  06/07/2014 FINDINGS: Mild cardiomegaly. Biapical scarring. Lungs  otherwise clear. No effusions. No acute bony abnormality. IMPRESSION: Mild cardiomegaly.  No active disease. Electronically Signed   By: Caryn BeeKevin  Dover M.D.   On: 02/03/2021 23:57   CT HEAD WO CONTRAST  Result Date: 02/03/2021 CLINICAL DATA:  Frequent falls.  Head trauma, moderate-severe EXAM: CT HEAD WITHOUT CONTRAST TECHNIQUE: Contiguous axial images were obtained from the base of the skull through the vertex without intravenous contrast. RADIATION DOSE REDUCTION: This exam was performed according to the departmental dose-optimization program which includes automated exposure control, adjustment of the mA and/or kV according to patient size and/or use of iterative reconstruction technique. COMPARISON:  12/25/2020 FINDINGS: Brain: No acute intracranial abnormality. Specifically, no hemorrhage, hydrocephalus, mass lesion, acute infarction, or significant intracranial injury. Vascular: No hyperdense vessel or unexpected calcification. Skull: No acute calvarial abnormality. Sinuses/Orbits: No acute findings Other: None IMPRESSION: No acute intracranial abnormality. Electronically Signed   By: Charlett Nose M.D.   On: 02/03/2021 23:55   CT Angio Chest PE W and/or Wo Contrast  Result Date: 02/04/2021 CLINICAL DATA:  Pulmonary embolism suspected.  Respiratory distress EXAM: CT ANGIOGRAPHY CHEST WITH CONTRAST TECHNIQUE: Multidetector CT imaging of the chest was performed using the standard protocol during bolus administration of intravenous contrast. Multiplanar CT image reconstructions and MIPs were obtained to evaluate the vascular anatomy. RADIATION DOSE REDUCTION: This exam was performed according to the departmental dose-optimization program which includes automated exposure control, adjustment of the mA and/or kV according to patient size and/or use of iterative reconstruction technique. CONTRAST:  50mL OMNIPAQUE IOHEXOL 350 MG/ML SOLN COMPARISON:  06/08/2014 FINDINGS: Cardiovascular: Satisfactory opacification  of the pulmonary arteries but suboptimal assessment due to motion artifact especially affecting the lobar and distal branches at the bases. No evidence of pulmonary embolism. Enlarged appearance of the atria. No pericardial effusion. Mediastinum/Nodes: Negative for adenopathy or mass. Lungs/Pleura: Subpleural reticulonodular density scattered in the bilateral lower lungs. Ground-glass opacity at the apices which is symmetric and is scarring based March 2022 cervical spine CT. Trace bilateral pleural fluid Upper Abdomen: No acute finding Musculoskeletal: Exaggerated thoracic kyphosis with multilevel bridging osteophyte. Review of the MIP images confirms the above findings. IMPRESSION: 1. Scattered reticulonodular opacities which appear infectious/inflammatory. These could be acute or chronic, please correlate for active pneumonia symptoms. 2. Mild cardiomegaly and trace pleural effusions. No pulmonary edema. 3. No evidence of pulmonary embolism but multifocal vessel obscuration from motion artifact. Electronically Signed   By: Tiburcio Pea M.D.   On: 02/04/2021 04:12   CT Cervical Spine Wo Contrast  Result Date: 02/03/2021 CLINICAL DATA:  Neck trauma (Age >= 65y).  Fall. EXAM: CT CERVICAL SPINE WITHOUT CONTRAST TECHNIQUE: Multidetector CT imaging of the cervical spine was performed without intravenous contrast. Multiplanar CT image reconstructions were also generated. RADIATION DOSE REDUCTION: This exam was performed according to the departmental dose-optimization program which includes automated exposure control, adjustment of the mA and/or kV according to patient size and/or use of iterative reconstruction technique. COMPARISON:  04/05/2020 FINDINGS: Alignment: Normal Skull base and vertebrae: No acute fracture. No primary bone lesion or focal pathologic process. Soft tissues and spinal canal: No prevertebral fluid or swelling. No visible canal hematoma. Disc levels: Mild degenerative disc disease, most  pronounced at C5-6 and C6-7. Moderate bilateral degenerative facet disease. Upper chest: Biapical scarring.  No acute findings Other: None IMPRESSION: Degenerative disc and facet disease. No acute bony abnormality. Electronically Signed   By: Charlett Nose M.D.   On: 02/03/2021 23:57   US Venous Img Lower Unilateral Right  Result Date: 02/04/2021 CLINICAL DATA:  Right lower extremity pain. EXAM: RIGHT LOWER EXTREMITY VENOUS DOPPLER ULTRASOUND TECHNIQUE: Gray-scale sonography with  compression, as well as color and duplex ultrasound, were performed to evaluate the deep venous system(s) from the level of the common femoral vein through the popliteal and proximal calf veins. COMPARISON:  Similar study 02/24/2016 FINDINGS: VENOUS Normal compressibility of the common femoral, superficial femoral, and popliteal veins, as well as the visualized calf veins. Visualized portions of profunda femoral vein and great saphenous vein unremarkable. No filling defects to suggest DVT on grayscale or color Doppler imaging. Doppler waveforms show normal direction of venous flow, normal respiratory plasticity and response to augmentation. Limited views of the contralateral common femoral vein are unremarkable. OTHER None. Limitations: none IMPRESSION: Negative. Comparison to prior study reveals no significant interval change. Electronically Signed   By: Almira Bar M.D.   On: 02/04/2021 02:41   DG Chest Portable 1 View  Result Date: 02/04/2021 CLINICAL DATA:  Shortness of breath. EXAM: PORTABLE CHEST 1 VIEW COMPARISON:  Chest radiograph dated 02/03/2021. FINDINGS: There is cardiomegaly. No focal consolidation, pleural effusion or pneumothorax. No acute osseous pathology. IMPRESSION: No acute cardiopulmonary process. Cardiomegaly. Electronically Signed   By: Elgie Collard M.D.   On: 02/04/2021 02:49   DG Knee Complete 4 Views Left  Result Date: 02/04/2021 CLINICAL DATA:  Left knee pain. EXAM: LEFT KNEE - COMPLETE 4+ VIEW  COMPARISON:  None. FINDINGS: There is no acute fracture or dislocation. The bones are osteopenic. Moderate arthritic changes with tricompartmental narrowing. No joint effusion. The soft tissues are unremarkable. IMPRESSION: 1. No acute fracture or dislocation. 2. Moderate arthritic change. Electronically Signed   By: Elgie Collard M.D.   On: 02/04/2021 01:24   DG Knee Complete 4 Views Right  Result Date: 02/04/2021 CLINICAL DATA:  No knee pain. EXAM: RIGHT KNEE - COMPLETE 4+ VIEW COMPARISON:  None. FINDINGS: There is no acute fracture or dislocation. The bones are osteopenic. Severe arthritic changes of the right knee with tricompartmental narrowing and spurring. Small suprapatellar effusion. The soft tissues are unremarkable IMPRESSION: 1. No acute fracture or dislocation. 2. Severe arthritic changes of the right knee. Electronically Signed   By: Elgie Collard M.D.   On: 02/04/2021 01:23   DG HIP UNILAT WITH PELVIS 2-3 VIEWS RIGHT  Result Date: 02/04/2021 CLINICAL DATA:  Fall with right hip pain. EXAM: DG HIP (WITH OR WITHOUT PELVIS) 2-3V RIGHT COMPARISON:  None. FINDINGS: There is no evidence of hip fracture or dislocation. There is mild joint space loss at the right hip with trace acetabular osteophytes, mild enthesopathic changes of the pelvis. There is osteopenia. Right SI joint unremarkable, as visualized. IMPRESSION: Osteopenia and degenerative change without evidence of fractures. Electronically Signed   By: Almira Bar M.D.   On: 02/04/2021 05:07      Scheduled Meds:  apixaban  5 mg Oral BID   metoprolol succinate  12.5 mg Oral Daily   potassium chloride  40 mEq Oral Q4H   Continuous Infusions:  [START ON 02/06/2021] azithromycin     cefTRIAXone (ROCEPHIN)  IV 200 mL/hr at 02/05/21 0437   lactated ringers 100 mL/hr at 02/05/21 0333     LOS: 1 day      Time spent: 35 minutes   Noralee Stain, DO [facility] 02/05/2021, 11:55 AM   Available via Epic secure chat  7am-7pm After these hours, please refer to coverage provider listed on [email or web address]

## 2021-02-05 NOTE — Evaluation (Signed)
Physical Therapy Evaluation Patient Details Name: Vanessa Logan MRN: [id number] DOB: 1926/12/10 Today's Date: 02/05/2021  History of Present Illness  Vanessa Logan is a 89yoF who comes to [facility] on 02/03/21 after persisten right hip pain s/p fall off commode 2 days PTA. PMH: HTN, AF on eliquis, demention. hip pelvis imaging unrevealing of any fracture. Pt has been able to AMB and weight bear with RW. Workup revealing of PNA.  Clinical Impression  Pt admitted c above Dx. Pt shows functional limitations due to the deficits listed below (see "PT Problem List"). Patient agreeable to PT evaluation. PLOF and home setup obtained. Pt able to perform bed mobility with minA, transfers c modA, but unable to walk- all mobility requires greater than typical effort and physical assistance. Pt struggles to obtain balance seated and standing, neither of which is obtained without facilitation from a 2nd person. Pt continuously dizzy after coming to sitting, HR briefly in 120s bpm, then at EOS becoming nauseated, found to he hypotensive- reclined farther to promote improved pressures-RN made aware. Patient's assessment reveals acute need for additional person for safety and/or physical assistance to complete their typical ADL. At baseline, the patient is able to perform ADL with modified independence. Patient will benefit from skilled PT intervention to maximize independence and safety in mobility required for basic ADL performance at discharge.          Recommendations for follow up therapy are one component of a multi-disciplinary discharge planning process, led by the attending physician.  Recommendations may be updated based on patient status, additional functional criteria and insurance authorization.  Follow Up Recommendations Skilled nursing-short term rehab (<3 hours/day)    Assistance Recommended at Discharge Intermittent Supervision/Assistance  Patient can return home with the following  Two people to help with  walking and/or transfers;Two people to help with bathing/dressing/bathroom;Assistance with cooking/housework;Assistance with feeding;Assist for transportation    Equipment Recommendations None recommended by PT  Recommendations for Other Services       Functional Status Assessment Patient has had a recent decline in their functional status and demonstrates the ability to make significant improvements in function in a reasonable and predictable amount of time.     Precautions / Restrictions Precautions Precautions: Fall      Mobility  Bed Mobility Overal bed mobility: Needs Assistance Bed Mobility: Supine to Sit     Supine to sit: Min assist;HOB elevated     General bed mobility comments: more difficulty than baseline, needs assist, is labored/difficult    Transfers Overall transfer level: Needs assistance Equipment used: Rolling walker (2 wheels) Transfers: Sit to/from Stand;Bed to chair/wheelchair/BSC Sit to Stand: Mod assist Stand pivot transfers: Mod assist (~60seconds, labored small steps holding on to [employer])         General transfer comment: minA from elevated surface, min-modA x30sec to estblaish standing balance    Ambulation/Gait Ambulation/Gait assistance:  (unable at this time)                Careers information officer    Modified Rankin (Stroke Patients Only)       Balance Overall balance assessment:  (delayed acquisition in sitting or stnading, unsafe, needs a 2nd person and excessive time to obtain)  Pertinent Vitals/Pain Pain Assessment: No/denies pain    Home Living Family/patient expects to be discharged to:: Private residence Living Arrangements: Alone Available Help at Discharge: Family (Darlene (SIL); 3 kids Aurther Loft (son)and wife Kathie Rhodes); Olegario Messier (DTR, primary contact) and Molli Posey; Ricki Miller (not local); Richard Artist (son)) Type of Home:  House Home Access: Ramped entrance       Home Layout: One level Home Equipment: Rollator (4 wheels) Additional Comments: sleeps in regular bed    Prior Function Prior Level of Function : Independent/Modified Independent             Mobility Comments: independent ADLs Comments: independent     Hand Dominance        Extremity/Trunk Assessment   Upper Extremity Assessment Upper Extremity Assessment: Generalized weakness    Lower Extremity Assessment Lower Extremity Assessment: Generalized weakness    Cervical / Trunk Assessment Cervical / Trunk Assessment: Kyphotic  Communication      Cognition Arousal/Alertness: Awake/alert Behavior During Therapy: WFL for tasks assessed/performed Overall Cognitive Status: Within Functional Limits for tasks assessed                                          General Comments      Exercises     Assessment/Plan    PT Assessment Patient needs continued PT services  PT Problem List Decreased strength;Decreased range of motion;Decreased activity tolerance;Decreased balance;Decreased mobility;Decreased coordination;Decreased knowledge of use of DME;Decreased cognition;Cardiopulmonary status limiting activity       PT Treatment Interventions DME instruction;Balance training;Gait training;Stair training;Functional mobility training;Therapeutic activities;Therapeutic exercise;Patient/family education    PT Goals (Current goals can be found in the Care Plan section)  Acute Rehab PT Goals Patient Stated Goal: regain strength PT Goal Formulation: With patient Time For Goal Achievement: 02/19/21 Potential to Achieve Goals: Good    Frequency Min 2X/week     Co-evaluation               AM-PAC PT "6 Clicks" Mobility  Outcome Measure Help needed turning from your back to your side while in a flat bed without using bedrails?: A Lot Help needed moving from lying on your back to sitting on the side of a flat  bed without using bedrails?: A Lot Help needed moving to and from a bed to a chair (including a wheelchair)?: A Lot Help needed standing up from a chair using your arms (e.g., wheelchair or bedside chair)?: A Lot Help needed to walk in hospital room?: A Lot Help needed climbing 3-5 steps with a railing? : Total 6 Click Score: 11    End of Session Equipment Utilized During Treatment: Gait belt Activity Tolerance: Patient tolerated treatment well;Patient limited by fatigue;Treatment limited secondary to medical complications (Comment) (dizzy while up, eventually hypotensive and nauseated) Patient left: in chair;with call bell/phone within reach;with nursing/sitter in room Nurse Communication: Mobility status PT Visit Diagnosis: Unsteadiness on feet (R26.81);Difficulty in walking, not elsewhere classified (R26.2);Muscle weakness (generalized) (M62.81);Dizziness and giddiness (R42);Other symptoms and signs involving the nervous system (R29.898)    Time: 1355-1447 PT Time Calculation (min) (ACUTE ONLY): 52 min   Charges:   PT Evaluation $PT Eval Moderate Complexity: 1 Mod PT Treatments $Therapeutic Activity: 8-22 mins       4:11 PM, 02/05/21 Rosamaria Lints, PT, DPT Physical Therapist - [facility]  [phone] (ASCOM)    Mitsuko Luera C 02/05/2021,  4:11 PM

## 2021-02-06 DIAGNOSIS — J9601 Acute respiratory failure with hypoxia: Secondary | ICD-10-CM | POA: Diagnosis not present

## 2021-02-06 LAB — BASIC METABOLIC PANEL
Anion gap: 10 (ref 5–15)
BUN: 43 mg/dL — ABNORMAL HIGH (ref 8–23)
CO2: 25 mmol/L (ref 22–32)
Calcium: 7.9 mg/dL — ABNORMAL LOW (ref 8.9–10.3)
Chloride: 100 mmol/L (ref 98–111)
Creatinine, Ser: 1.26 mg/dL — ABNORMAL HIGH (ref 0.44–1.00)
GFR, Estimated: 40 mL/min — ABNORMAL LOW (ref >60)
Glucose, Bld: 97 mg/dL (ref 70–99)
Potassium: 4 mmol/L (ref 3.5–5.1)
Sodium: 135 mmol/L (ref 135–145)

## 2021-02-06 LAB — CBC
HCT: 34.2 % — ABNORMAL LOW (ref 36.0–46.0)
Hemoglobin: 11.2 g/dL — ABNORMAL LOW (ref 12.0–15.0)
MCH: 26.9 pg (ref 26.0–34.0)
MCHC: 32.7 g/dL (ref 30.0–36.0)
MCV: 82 fL (ref 80.0–100.0)
Platelets: 158 10*3/uL (ref 150–400)
RBC: 4.17 MIL/uL (ref 3.87–5.11)
RDW: 14.1 % (ref 11.5–15.5)
WBC: 9.3 10*3/uL (ref 4.0–10.5)
nRBC: 0 % (ref 0.0–0.2)

## 2021-02-06 LAB — PROTIME-INR
INR: 2 — ABNORMAL HIGH (ref 0.8–1.2)
Prothrombin Time: 22.9 seconds — ABNORMAL HIGH (ref 11.4–15.2)

## 2021-02-06 LAB — CULTURE, BLOOD (SINGLE): Special Requests: ADEQUATE

## 2021-02-06 LAB — MAGNESIUM: Magnesium: 2 mg/dL (ref 1.7–2.4)

## 2021-02-06 MED ORDER — METOPROLOL SUCCINATE ER 25 MG PO TB24
12.5000 mg | ORAL_TABLET | Freq: Every day | ORAL | Status: DC
  Administered 2021-02-06 – 2021-02-09 (×4): 12.5 mg via ORAL
  Filled 2021-02-06 (×4): qty 1

## 2021-02-06 NOTE — Progress Notes (Addendum)
PROGRESS NOTE    Vanessa Logan  KGM:[id number] DOB: [birth year] DOA: 02/03/2021 PCP: Marguarite Arbour, MD     Brief Narrative:  Vanessa Logan is a 88 y.o. female with medical history significant for HTN, A. fib on Eliquis, dementia, who lives alone was brought to the ED initially for right hip pain following a fall off the commode 2 days prior.  She was able to get up and has been ambulating with her walker since.  States she hit her right hip and her head.  Patient denies being previously unwell prior to the fall.  Work-up has revealed multifocal pneumonia, as patient became increasingly tachypneic, she was started on BiPAP in the emergency department.    New events last 24 hours / Subjective: States that she is not feeling well today, but unable to specify or qualify what was going on.  Denies any fevers, chills, chest pain, abdominal pain.  No nausea or vomiting. Orthostatic vitals negative.   Assessment & Plan:   Principal Problem:   Acute respiratory failure (HCC) Active Problems:   Fall at home, initial encounter   AKI (acute kidney injury) (HCC)   Metabolic acidosis   Hyperkalemia   Right hip pain   Rapid atrial fibrillation (HCC)   Multifocal pneumonia   Sepsis (HCC)   Severe sepsis (HCC)   Dementia without behavioral disturbance (HCC)   Severe sepsis secondary to E. coli bacteremia, multifocal pneumonia -Sepsis present on admission -CTA chest: Scattered reticulonodular opacities which appear infectious/inflammatory. No evidence of pulmonary embolism  -Patient remains on IV rocephin/azithromycin   Acute hypoxemic respiratory failure -Required BiPAP for short period of time, now weaned off and on room air  AKI -Baseline creatinine 0.9 -Improving, continue IVF  -Hold HCTZ, cozaar   A. fib RVR -Continue Eliquis, metoprolol  Fall at home -Right hip xray: Osteopenia and degenerative change without evidence of fractures. -PT recommended SNF placement  Elevated  INR -Patient is not on coumadin, has normal LFTs, ?etiology, she is on eliquis  -Improved overnight, continue to monitor  Dementia -Takes Haldol, currently does live at home alone  Hyponatremia -Improving    DVT prophylaxis:  apixaban (ELIQUIS) tablet 5 mg  Code Status: Full code Family Communication: Daughter over the phone 1/12 Disposition Plan:  Status is: Inpatient  Remains inpatient appropriate because: Remains on IV antibiotics, IV fluids.  SNF placement is pending    Antimicrobials:  Anti-infectives (From admission, onward)    Start     Dose/Rate Route Frequency Ordered Stop   02/06/21 0800  azithromycin (ZITHROMAX) 500 mg in sodium chloride 0.9 % 250 mL IVPB        500 mg 250 mL/hr over 60 Minutes Intravenous Every 24 hours 02/04/21 1013 02/11/21 0759   02/06/21 0800  cefTRIAXone (ROCEPHIN) 2 g in sodium chloride 0.9 % 100 mL IVPB  Status:  Discontinued        2 g 200 mL/hr over 30 Minutes Intravenous Every 24 hours 02/04/21 1013 02/04/21 1612   02/05/21 0400  cefTRIAXone (ROCEPHIN) 2 g in sodium chloride 0.9 % 100 mL IVPB        2 g 200 mL/hr over 30 Minutes Intravenous Every 24 hours 02/04/21 1612     02/04/21 2300  azithromycin (ZITHROMAX) 500 mg in sodium chloride 0.9 % 250 mL IVPB  Status:  Discontinued        500 mg 250 mL/hr over 60 Minutes Intravenous Every 24 hours 02/04/21 0500 02/04/21 1013   02/04/21 2200  cefTRIAXone (ROCEPHIN) 1 g in sodium chloride 0.9 % 100 mL IVPB  Status:  Discontinued        1 g 200 mL/hr over 30 Minutes Intravenous Every 24 hours 02/04/21 0500 02/04/21 1013   02/04/21 0430  levofloxacin (LEVAQUIN) IVPB 750 mg        750 mg 100 mL/hr over 90 Minutes Intravenous  Once 02/04/21 0422 02/04/21 0640        Objective: Vitals:   02/05/21 1943 02/06/21 0000 02/06/21 0431 02/06/21 0809  BP: 110/69 105/64 116/82 120/73  Pulse: 91 88 93 100  Resp: 20 16 16 16   Temp: 97.6 F (36.4 C) 97.9 F (36.6 C) 98.6 F (37 C) 97.8 F  (36.6 C)  TempSrc:  Oral Oral Oral  SpO2: 98% 96% 97% 93%  Weight:      Height:        Intake/Output Summary (Last 24 hours) at 02/06/2021 1212 Last data filed at 02/06/2021 0431 Gross per 24 hour  Intake 720 ml  Output 400 ml  Net 320 ml    Filed Weights   02/03/21 2318  Weight: 72.6 kg    Examination:  General exam: Appears calm and comfortable  Respiratory system: Clear to auscultation. Respiratory effort normal. No respiratory distress. No conversational dyspnea. On room air  Cardiovascular system: S1 & S2 heard, irrg rhythm, tachy 110. No murmurs. No pedal edema. Gastrointestinal system: Abdomen is nondistended, soft and nontender. Normal bowel sounds heard. Central nervous system: Alert Extremities: Symmetric in appearance  Skin: No rashes, lesions or ulcers on exposed skin  Psychiatry: Mood & affect appropriate.   Data Reviewed: I have personally reviewed following labs and imaging studies  CBC: Recent Labs  Lab 02/03/21 2334 02/04/21 0440 02/05/21 0656 02/06/21 0631  WBC 17.3* 16.2* 11.4* 9.3  HGB 11.9* 10.8* 10.8* 11.2*  HCT 37.0 33.4* 31.9* 34.2*  MCV 84.3 85.4 81.6 82.0  PLT 176 153 147* 158    Basic Metabolic Panel: Recent Labs  Lab 02/03/21 2334 02/04/21 0440 02/04/21 0910 02/05/21 0656 02/06/21 0631  NA 125* 129*  --  134* 135  K 7.4* 5.5* 4.8 2.9* 4.0  CL 105 104  --  102 100  CO2 15* 18*  --  23 25  GLUCOSE 106* 115*  --  89 97  BUN 64* 63*  --  50* 43*  CREATININE 1.49* 1.47*  --  1.34* 1.26*  CALCIUM 8.7* 8.6*  --  7.7* 7.9*  MG  --   --   --   --  2.0    GFR: Estimated Creatinine Clearance: 28.7 mL/min (A) (by C-G formula based on SCr of 1.26 mg/dL (H)). Liver Function Tests: Recent Labs  Lab 02/03/21 2334 02/04/21 0440  AST 24 25  ALT 20 19  ALKPHOS 107 106  BILITOT 1.4* 1.2  PROT 6.6 5.7*  ALBUMIN 2.7* 2.4*    Recent Labs  Lab 02/03/21 2334  LIPASE 34    No results for input(s): AMMONIA in the last 168  hours. Coagulation Profile: Recent Labs  Lab 02/04/21 0440 02/05/21 0656 02/06/21 0631  INR 2.2* 2.4* 2.0*    Cardiac Enzymes: No results for input(s): CKTOTAL, CKMB, CKMBINDEX, TROPONINI in the last 168 hours. BNP (last 3 results) No results for input(s): PROBNP in the last 8760 hours. HbA1C: No results for input(s): HGBA1C in the last 72 hours. CBG: No results for input(s): GLUCAP in the last 168 hours. Lipid Profile: No results for input(s): CHOL, HDL, LDLCALC, TRIG,  CHOLHDL, LDLDIRECT in the last 72 hours. Thyroid Function Tests: Recent Labs    02/03/21 2334  TSH 2.956  FREET4 1.65*    Anemia Panel: No results for input(s): VITAMINB12, FOLATE, FERRITIN, TIBC, IRON, RETICCTPCT in the last 72 hours. Sepsis Labs: Recent Labs  Lab 02/04/21 0440 02/04/21 0710 02/04/21 1257  PROCALCITON 8.71  --   --   LATICACIDVEN 3.9* 4.1* 1.8     Recent Results (from the past 240 hour(s))  Resp Panel by RT-PCR (Flu A&B, Covid) Nasopharyngeal Swab     Status: None   Collection Time: 02/03/21 11:34 PM   Specimen: Nasopharyngeal Swab; Nasopharyngeal(NP) swabs in vial transport medium  Result Value Ref Range Status   SARS Coronavirus 2 by RT PCR NEGATIVE NEGATIVE Final    Comment: (NOTE) SARS-CoV-2 target nucleic acids are NOT DETECTED.  The SARS-CoV-2 RNA is generally detectable in upper respiratory specimens during the acute phase of infection. The lowest concentration of SARS-CoV-2 viral copies this assay can detect is 138 copies/mL. A negative result does not preclude SARS-Cov-2 infection and should not be used as the sole basis for treatment or other patient management decisions. A negative result may occur with  improper specimen collection/handling, submission of specimen other than nasopharyngeal swab, presence of viral mutation(s) within the areas targeted by this assay, and inadequate number of viral copies(<138 copies/mL). A negative result must be combined  with clinical observations, patient history, and epidemiological information. The expected result is Negative.  Fact Sheet for Patients:  BloggerCourse.com  Fact Sheet for Healthcare Providers:  SeriousBroker.it  This test is no t yet approved or cleared by the [place] FDA and  has been authorized for detection and/or diagnosis of SARS-CoV-2 by FDA under an Emergency Use Authorization (EUA). This EUA will remain  in effect (meaning this test can be used) for the duration of the COVID-19 declaration under Section 564(b)(1) of the Act, 21 U.S.C.section 360bbb-3(b)(1), unless the authorization is terminated  or revoked sooner.       Influenza A by PCR NEGATIVE NEGATIVE Final   Influenza B by PCR NEGATIVE NEGATIVE Final    Comment: (NOTE) The Xpert Xpress SARS-CoV-2/FLU/RSV plus assay is intended as an aid in the diagnosis of influenza from Nasopharyngeal swab specimens and should not be used as a sole basis for treatment. Nasal washings and aspirates are unacceptable for Xpert Xpress SARS-CoV-2/FLU/RSV testing.  Fact Sheet for Patients: BloggerCourse.com  Fact Sheet for Healthcare Providers: SeriousBroker.it  This test is not yet approved or cleared by the [place] FDA and has been authorized for detection and/or diagnosis of SARS-CoV-2 by FDA under an Emergency Use Authorization (EUA). This EUA will remain in effect (meaning this test can be used) for the duration of the COVID-19 declaration under Section 564(b)(1) of the Act, 21 U.S.C. section 360bbb-3(b)(1), unless the authorization is terminated or revoked.  Performed at [facility], [address] [place], [place] [address]   Culture, blood (single)     Status: Abnormal   Collection Time: 02/04/21  4:40 AM   Specimen: BLOOD  Result Value Ref Range Status   Specimen Description   Final    BLOOD  RIGHT ANTECUBITAL Performed at [facility], [address] [place], [place] [address]    Special Requests   Final    BOTTLES DRAWN AEROBIC AND ANAEROBIC Blood Culture adequate volume Performed at [facility], [address], [place], [place] [address]    Culture  Setup Time   Final  GRAM NEGATIVE RODS IN BOTH AEROBIC AND ANAEROBIC BOTTLES CRITICAL RESULT CALLED TO, READ BACK BY AND VERIFIED WITH: LISA KLUTTZ AT 1543 ON 02/04/21 BY SS Performed at [facility], [address] [place], [place] [address]    Culture ESCHERICHIA COLI (A)  Final   Report Status 02/06/2021 FINAL  Final   Organism ID, Bacteria ESCHERICHIA COLI  Final      Susceptibility   Escherichia coli - MIC*    AMPICILLIN >=32 RESISTANT Resistant     CEFAZOLIN <=4 SENSITIVE Sensitive     CEFEPIME <=0.12 SENSITIVE Sensitive     CEFTAZIDIME <=1 SENSITIVE Sensitive     CEFTRIAXONE <=0.25 SENSITIVE Sensitive     CIPROFLOXACIN <=0.25 SENSITIVE Sensitive     GENTAMICIN <=1 SENSITIVE Sensitive     IMIPENEM <=0.25 SENSITIVE Sensitive     TRIMETH/SULFA >=320 RESISTANT Resistant     AMPICILLIN/SULBACTAM 16 INTERMEDIATE Intermediate     PIP/TAZO <=4 SENSITIVE Sensitive     * ESCHERICHIA COLI  Blood Culture ID Panel (Reflexed)     Status: Abnormal   Collection Time: 02/04/21  4:40 AM  Result Value Ref Range Status   Enterococcus faecalis NOT DETECTED NOT DETECTED Final   Enterococcus Faecium NOT DETECTED NOT DETECTED Final   Listeria monocytogenes NOT DETECTED NOT DETECTED Final   Staphylococcus species NOT DETECTED NOT DETECTED Final   Staphylococcus aureus (BCID) NOT DETECTED NOT DETECTED Final   Staphylococcus epidermidis NOT DETECTED NOT DETECTED Final   Staphylococcus lugdunensis NOT DETECTED NOT DETECTED Final   Streptococcus species NOT DETECTED NOT DETECTED Final   Streptococcus agalactiae NOT DETECTED NOT DETECTED Final   Streptococcus pneumoniae NOT DETECTED NOT DETECTED Final    Streptococcus pyogenes NOT DETECTED NOT DETECTED Final   A.calcoaceticus-baumannii NOT DETECTED NOT DETECTED Final   Bacteroides fragilis NOT DETECTED NOT DETECTED Final   Enterobacterales DETECTED (A) NOT DETECTED Final    Comment: Enterobacterales represent a large order of gram negative bacteria, not a single organism. CRITICAL RESULT CALLED TO, READ BACK BY AND VERIFIED WITH: LISA KLUTTZ AT 1543 ON 02/04/21 BY SS    Enterobacter cloacae complex NOT DETECTED NOT DETECTED Final   Escherichia coli DETECTED (A) NOT DETECTED Final    Comment: CRITICAL RESULT CALLED TO, READ BACK BY AND VERIFIED WITH: LISA KLUTTZ AT 1543 ON 02/04/21 BY SS    Klebsiella aerogenes NOT DETECTED NOT DETECTED Final   Klebsiella oxytoca NOT DETECTED NOT DETECTED Final   Klebsiella pneumoniae NOT DETECTED NOT DETECTED Final   Proteus species NOT DETECTED NOT DETECTED Final   Salmonella species NOT DETECTED NOT DETECTED Final   Serratia marcescens NOT DETECTED NOT DETECTED Final   Haemophilus influenzae NOT DETECTED NOT DETECTED Final   Neisseria meningitidis NOT DETECTED NOT DETECTED Final   Pseudomonas aeruginosa NOT DETECTED NOT DETECTED Final   Stenotrophomonas maltophilia NOT DETECTED NOT DETECTED Final   Candida albicans NOT DETECTED NOT DETECTED Final   Candida auris NOT DETECTED NOT DETECTED Final   Candida glabrata NOT DETECTED NOT DETECTED Final   Candida krusei NOT DETECTED NOT DETECTED Final   Candida parapsilosis NOT DETECTED NOT DETECTED Final   Candida tropicalis NOT DETECTED NOT DETECTED Final   Cryptococcus neoformans/gattii NOT DETECTED NOT DETECTED Final   CTX-M ESBL NOT DETECTED NOT DETECTED Final   Carbapenem resistance IMP NOT DETECTED NOT DETECTED Final   Carbapenem resistance KPC NOT DETECTED NOT DETECTED Final   Carbapenem resistance NDM NOT DETECTED NOT DETECTED Final   Carbapenem resist OXA 48  LIKE NOT DETECTED NOT DETECTED Final   Carbapenem resistance VIM NOT DETECTED NOT DETECTED  Final    Comment: Performed at [facility], [address] [place], [place] [address]  Urine Culture     Status: None   Collection Time: 02/04/21  6:14 PM   Specimen: Urine, Random  Result Value Ref Range Status   Specimen Description   Final    URINE, RANDOM Performed at [facility], [address] [place], [place] [address]    Special Requests   Final    NONE Performed at [facility], [address] [place], [place] [address]    Culture   Final    NO GROWTH Performed at [facility], [address] [place], [place] [address]    Report Status 02/05/2021 FINAL  Final       Radiology Studies: No results found.    Scheduled Meds:  apixaban  5 mg Oral BID   haloperidol  0.5 mg Oral BID   Continuous Infusions:  azithromycin 500 mg (02/06/21 1039)   cefTRIAXone (ROCEPHIN)  IV 2 g (02/06/21 0430)   lactated ringers 100 mL/hr at 02/06/21 0431     LOS: 2 days     Noralee StainJennifer Ranell Finelli, DO [facility] 02/06/2021, 12:12 PM   Available via Epic secure chat 7am-7pm After these hours, please refer to coverage provider listed on [email or web address]

## 2021-02-06 NOTE — Care Management Important Message (Signed)
Important Message  Patient Details  Name: Vanessa Logan MRN: [id number] Date of Birth: 05/04/1926   Medicare Important Message Given:  Yes     Johnell Comings 02/06/2021, 4:19 PM

## 2021-02-06 NOTE — TOC Initial Note (Signed)
Transition of Care [facility]) - Initial/Assessment Note    Patient Details  Name: Vanessa Logan MRN: [id number] Date of Birth: 19-May-1926  Transition of Care [facility]) CM/SW Contact:    Gildardo Griffes, LCSW Phone Number: 02/06/2021, 2:39 PM  Clinical Narrative:                  CSW spoke with patient's daughter Olegario Messier regarding SNF rec, Olegario Messier reports she is in agreement as patient lives home alone and is in early stages of dementia. Reports patient had a Charity fundraiser and social worker visit the home once a week, and Olegario Messier and her brothers would assist patient PRN.   She reports preference for [facility] and agreeable for CSW to send out referrals.   Referral sent pending bed offer at this time.   [place], [place] [phone]   Expected Discharge Plan: Skilled Nursing Facility Barriers to Discharge: Continued Medical Work up   Patient Goals and CMS Choice Patient states their goals for this hospitalization and ongoing recovery are:: to go home CMS Medicare.gov Compare Post Acute Care list provided to:: Patient Represenative (must comment) (daughter) Choice offered to / list presented to : Adult Children  Expected Discharge Plan and Services Expected Discharge Plan: Skilled Nursing Facility       Living arrangements for the past 2 months: Single Family Home                                      Prior Living Arrangements/Services Living arrangements for the past 2 months: Single Family Home Lives with:: Self Patient language and need for interpreter reviewed:: Yes Do you feel safe going back to the place where you live?: Yes      Need for Family Participation in Patient Care: Yes (Comment) Care giver support system in place?: Yes (comment)   Criminal Activity/Legal Involvement Pertinent to Current Situation/Hospitalization: No - Comment as needed  Activities of Daily Living Home Assistive Devices/Equipment: Walker (specify type) ADL Screening (condition at time of  admission) Patient's cognitive ability adequate to safely complete daily activities?: Yes Is the patient deaf or have difficulty hearing?: No Does the patient have difficulty seeing, even when wearing glasses/contacts?: No Does the patient have difficulty concentrating, remembering, or making decisions?: No Patient able to express need for assistance with ADLs?: Yes Does the patient have difficulty dressing or bathing?: Yes Independently performs ADLs?: No Communication: Independent Dressing (OT): Needs assistance Is this a change from baseline?: Change from baseline, expected to last <3days Grooming: Needs assistance Is this a change from baseline?: Change from baseline, expected to last <3 days Feeding: Independent Bathing: Needs assistance Is this a change from baseline?: Pre-admission baseline Toileting: Dependent Is this a change from baseline?: Pre-admission baseline In/Out Bed: Needs assistance Is this a change from baseline?: Change from baseline, expected to last <3 days Walks in Home: Needs assistance Is this a change from baseline?: Change from baseline, expected to last <3 days Does the patient have difficulty walking or climbing stairs?: Yes Weakness of Legs: Both Weakness of Arms/Hands: Left  Permission Sought/Granted Permission sought to share information with : Case Manager, Magazine features editor, Family Supports    Share Information with NAME: Olegario Messier  Permission granted to share info w AGENCY: SNFs  Permission granted to share info w Relationship: daughter  Permission granted to share info w Contact Information: [phone]  Emotional Assessment  Alcohol / Substance Use: Not Applicable Psych Involvement: No (comment)  Admission diagnosis:  Fall [W19.XXXA] Right hip pain [M25.551] AKI (acute kidney injury) (HCC) [N17.9] Fall, initial encounter [W19.XXXA] Severe sepsis (HCC) [A41.9, R65.20] Multifocal pneumonia [J18.9] Community acquired  pneumonia, unspecified laterality [J18.9] Sepsis with acute hypoxic respiratory failure without septic shock, due to unspecified organism (HCC) [A41.9, R65.20, J96.01] Patient Active Problem List   Diagnosis Date Noted   Dementia without behavioral disturbance (HCC) 02/05/2021   Fall at home, initial encounter 02/04/2021   Acute respiratory failure (HCC) 02/04/2021   AKI (acute kidney injury) (HCC) 02/04/2021   Metabolic acidosis 02/04/2021   Hyperkalemia 02/04/2021   Right hip pain 02/04/2021   Rapid atrial fibrillation (HCC) 02/04/2021   Multifocal pneumonia 02/04/2021   Sepsis (HCC) 02/04/2021   Severe sepsis (HCC) 02/04/2021   Acute UTI 07/04/2017   Fibrocystic breast disease 06/29/2012   Family history of malignant neoplasm of breast    PCP:  Marguarite Arbour, MD Pharmacy:   [facility] [address] [place], [place] - [address] [place] [address] Phone: [phone] Fax: [phone]  [id number] [place], [place] - [address] AT [facility] OF [address] [place] [address] Phone: [phone] Fax: [phone]     Social Determinants of Health (SDOH) Interventions    Readmission Risk Interventions No flowsheet data found.

## 2021-02-06 NOTE — NC FL2 (Signed)
[place] MEDICAID FL2 LEVEL OF CARE SCREENING TOOL     IDENTIFICATION  Patient Name: Vanessa Logan Birthdate: 11-25-1926 Sex: female Admission Date (Current Location): 02/03/2021  [facility] and [place] Number:  Chiropodist and Address:  [facility] [place], [address] [place], [place] [address]      Provider Number: [id number]  Attending Physician Name and Address:  Noralee Stain, DO  Relative Name and Phone Number:  Olegario Messier (daughter) [phone]    Current Level of Care: Hospital Recommended Level of Care: Skilled Nursing Facility Prior Approval Number:    Date Approved/Denied:   PASRR Number: [id number]  Discharge Plan: SNF    Current Diagnoses: Patient Active Problem List   Diagnosis Date Noted   Dementia without behavioral disturbance (HCC) 02/05/2021   Fall at home, initial encounter 02/04/2021   Acute respiratory failure (HCC) 02/04/2021   AKI (acute kidney injury) (HCC) 02/04/2021   Metabolic acidosis 02/04/2021   Hyperkalemia 02/04/2021   Right hip pain 02/04/2021   Rapid atrial fibrillation (HCC) 02/04/2021   Multifocal pneumonia 02/04/2021   Sepsis (HCC) 02/04/2021   Severe sepsis (HCC) 02/04/2021   Acute UTI 07/04/2017   Fibrocystic breast disease 06/29/2012   Family history of malignant neoplasm of breast     Orientation RESPIRATION BLADDER Height & Weight     Self, Time, Situation, Place  Normal Incontinent, External catheter Weight: 160 lb (72.6 kg) Height:  5\' 8"  (172.7 cm)  BEHAVIORAL SYMPTOMS/MOOD NEUROLOGICAL BOWEL NUTRITION STATUS      Continent Diet (see discharge summary)  AMBULATORY STATUS COMMUNICATION OF NEEDS Skin   Extensive Assist Verbally Other (Comment) (excoriated perineum)                       Personal Care Assistance Level of Assistance  Bathing, Feeding, Dressing, Total care Bathing Assistance: Limited assistance Feeding assistance: Limited assistance Dressing Assistance: Limited  assistance Total Care Assistance: Maximum assistance   Functional Limitations Info  Sight, Hearing, Speech Sight Info: Adequate Hearing Info: Adequate Speech Info: Adequate    SPECIAL CARE FACTORS FREQUENCY  PT (By licensed PT), OT (By licensed OT)     PT Frequency: min 4x weekly OT Frequency: min 4x weekly            Contractures Contractures Info: Not present    Additional Factors Info  Code Status, Allergies Code Status Info: full Allergies Info: neosporin, keflex           Current Medications (02/06/2021):  This is the current hospital active medication list Current Facility-Administered Medications  Medication Dose Route Frequency Provider Last Rate Last Admin   acetaminophen (TYLENOL) tablet 650 mg  650 mg Oral Q6H PRN 02/08/2021, MD   650 mg at 02/05/21 1300   Or   acetaminophen (TYLENOL) suppository 650 mg  650 mg Rectal Q6H PRN 04/05/21, MD       apixaban Andris Baumann) tablet 5 mg  5 mg Oral BID Everlene Balls V, MD   5 mg at 02/06/21 1038   azithromycin (ZITHROMAX) 500 mg in sodium chloride 0.9 % 250 mL IVPB  500 mg Intravenous Q24H 02/08/21 B, RPH 250 mL/hr at 02/06/21 1039 500 mg at 02/06/21 1039   cefTRIAXone (ROCEPHIN) 2 g in sodium chloride 0.9 % 100 mL IVPB  2 g Intravenous Q24H 02/08/21, RPH 200 mL/hr at 02/06/21 0430 2 g at 02/06/21 0430   haloperidol (HALDOL) tablet 0.5 mg  0.5 mg Oral  BID Noralee Stain, DO   0.5 mg at 02/06/21 1038   HYDROcodone-acetaminophen (NORCO/VICODIN) 5-325 MG per tablet 1-2 tablet  1-2 tablet Oral Q4H PRN Andris Baumann, MD       lactated ringers infusion   Intravenous Continuous Noralee Stain, DO 100 mL/hr at 02/06/21 0431 New Bag at 02/06/21 0431   metoprolol succinate (TOPROL-XL) 24 hr tablet 12.5 mg  12.5 mg Oral Daily Noralee Stain, DO   12.5 mg at 02/06/21 1229   ondansetron (ZOFRAN) tablet 4 mg  4 mg Oral Q6H PRN Andris Baumann, MD       Or   ondansetron [facility]) injection 4 mg  4 mg  Intravenous Q6H PRN Andris Baumann, MD   4 mg at 02/05/21 1446     Discharge Medications: Please see discharge summary for a list of discharge medications.  Relevant Imaging Results:  Relevant Lab Results:   Additional Information SSN:[id number]  Gildardo Griffes, LCSW

## 2021-02-06 NOTE — Evaluation (Signed)
Occupational Therapy Evaluation Patient Details Name: Vanessa Logan MRN: [id number] DOB: 1926-09-01 Today's Date: 02/06/2021   History of Present Illness Vanessa Logan is a 89yoF who [facility] after persisten right hip pain s/p fall off commode 2 days PTA. PMH: HTN, AF on eliquis, demention. hip pelvis imaging unrevealing of any fracture. Pt has been able to AMB and weight bear with RW. Workup revealing of PNA.   Clinical Impression   [name] was seen for OT evaluation this date. Prior to hospital admission, pt was MOD I for mobility and ADLs using 4WW. Pt lives alone with family available PRN. Pt presents to acute OT demonstrating impaired ADL performance and functional mobility 2/2 decreased activity tolerance and functional strength/ROM/balance deficits. Pt currently requires SETUP + SUPERVISION grooming sittign EOB. SUPERVISION don/doff socks seated EOB. MOD A + RW for ADL t/f, limited ~3 ft mobility. Pt would benefit from skilled OT to address noted impairments and functional limitations (see below for any additional details). Upon hospital discharge, recommend STR to maximize pt safety and return to PLOF.       Recommendations for follow up therapy are one component of a multi-disciplinary discharge planning process, led by the attending physician.  Recommendations may be updated based on patient status, additional functional criteria and insurance authorization.   Follow Up Recommendations  Skilled nursing-short term rehab (<3 hours/day)    Assistance Recommended at Discharge Intermittent Supervision/Assistance  Patient can return home with the following A lot of help with walking and/or transfers;A lot of help with bathing/dressing/bathroom;Help with stairs or ramp for entrance    Functional Status Assessment  Patient has had a recent decline in their functional status and demonstrates the ability to make significant improvements in function in a reasonable and predictable  amount of time.  Equipment Recommendations  BSC/3in1    Recommendations for Other Services       Precautions / Restrictions Precautions Precautions: Fall Restrictions Weight Bearing Restrictions: No      Mobility Bed Mobility Overal bed mobility: Needs Assistance Bed Mobility: Supine to Sit;Sit to Supine     Supine to sit: Min assist;HOB elevated Sit to supine: Min assist        Transfers Overall transfer level: Needs assistance Equipment used: Rolling walker (2 wheels) Transfers: Sit to/from Stand Sit to Stand: Mod assist                  Balance Overall balance assessment: Needs assistance Sitting-balance support: No upper extremity supported;Feet supported Sitting balance-Leahy Scale: Good     Standing balance support: Bilateral upper extremity supported;Reliant on assistive device for balance                               ADL either performed or assessed with clinical judgement   ADL Overall ADL's : Needs assistance/impaired                                       General ADL Comments: SETUP + SUPERVISION grooming sittign EOB. SUPERVISION don/doff socks seated EOB. MOD A + RW for ADL t/f, limited ~3 ft mobility.      Pertinent Vitals/Pain Pain Assessment: No/denies pain     Hand Dominance Right   Extremity/Trunk Assessment Upper Extremity Assessment Upper Extremity Assessment: Generalized weakness   Lower Extremity Assessment Lower Extremity Assessment: Generalized  weakness       Communication Communication Communication: HOH   Cognition Arousal/Alertness: Awake/alert Behavior During Therapy: WFL for tasks assessed/performed Overall Cognitive Status: Within Functional Limits for tasks assessed                                 General Comments: orietned to place, date (with cues), states her birthday is next week and she will be 68 (accurate) but unable to state the year.     General  Comments  HR 129 standing            Home Living Family/patient expects to be discharged to:: Private residence Living Arrangements: Alone Available Help at Discharge: Family Type of Home: House Home Access: Ramped entrance     Home Layout: One level     Bathroom Shower/Tub: Tub/shower unit         Home Equipment: Rollator (4 wheels)   Additional Comments: sleeps in regular bed      Prior Functioning/Environment Prior Level of Function : Independent/Modified Independent               ADLs Comments: MOD I for ADLs, sponge baths and sits for grooming. Daughter/Daughter in law provide meals t/o week        OT Problem List: Decreased strength;Decreased activity tolerance;Impaired balance (sitting and/or standing)      OT Treatment/Interventions: Self-care/ADL training;Therapeutic exercise;Energy conservation;DME and/or AE instruction;Therapeutic activities;Patient/family education;Balance training    OT Goals(Current goals can be found in the care plan section) Acute Rehab OT Goals Patient Stated Goal: to go home OT Goal Formulation: With patient Time For Goal Achievement: 02/20/21 Potential to Achieve Goals: Good ADL Goals Pt Will Perform Grooming: sitting;with set-up;with supervision Pt Will Perform Lower Body Dressing: with supervision;sit to/from stand Pt Will Transfer to Toilet: with supervision;ambulating;bedside commode  OT Frequency: Min 2X/week    Co-evaluation              AM-PAC OT "6 Clicks" Daily Activity     Outcome Measure Help from another person eating meals?: None Help from another person taking care of personal grooming?: A Little Help from another person toileting, which includes using toliet, bedpan, or urinal?: A Lot Help from another person bathing (including washing, rinsing, drying)?: A Lot Help from another person to put on and taking off regular upper body clothing?: A Little Help from another person to put on and taking  off regular lower body clothing?: A Lot 6 Click Score: 16   End of Session Equipment Utilized During Treatment: Rolling walker (2 wheels)  Activity Tolerance: Patient tolerated treatment well Patient left: in bed;with call bell/phone within reach;with bed alarm set  OT Visit Diagnosis: Unsteadiness on feet (R26.81)                Time: 1126-1150 OT Time Calculation (min): 24 min Charges:  OT General Charges $OT Visit: 1 Visit OT Evaluation $OT Eval Moderate Complexity: 1 Mod OT Treatments $Self Care/Home Management : 8-22 mins  Kathie Dike, M.S. OTR/L  02/06/21, 1:35 PM  ascom [phone]

## 2021-02-07 DIAGNOSIS — J9601 Acute respiratory failure with hypoxia: Secondary | ICD-10-CM | POA: Diagnosis not present

## 2021-02-07 LAB — BASIC METABOLIC PANEL
Anion gap: 7 (ref 5–15)
BUN: 37 mg/dL — ABNORMAL HIGH (ref 8–23)
CO2: 26 mmol/L (ref 22–32)
Calcium: 7.9 mg/dL — ABNORMAL LOW (ref 8.9–10.3)
Chloride: 101 mmol/L (ref 98–111)
Creatinine, Ser: 1 mg/dL (ref 0.44–1.00)
GFR, Estimated: 53 mL/min — ABNORMAL LOW (ref >60)
Glucose, Bld: 93 mg/dL (ref 70–99)
Potassium: 3.7 mmol/L (ref 3.5–5.1)
Sodium: 134 mmol/L — ABNORMAL LOW (ref 135–145)

## 2021-02-07 LAB — PROTIME-INR
INR: 2 — ABNORMAL HIGH (ref 0.8–1.2)
Prothrombin Time: 22.9 seconds — ABNORMAL HIGH (ref 11.4–15.2)

## 2021-02-07 NOTE — Progress Notes (Signed)
PROGRESS NOTE    Vanessa Logan  XBJ:[id number] DOB: 11/08/1926 DOA: 02/03/2021 PCP: Marguarite ArbourSparks, Jeffrey D, MD     Brief Narrative:  Vanessa Logan is a 88 y.o. female with medical history significant for HTN, A. fib on Eliquis, dementia, who lives alone was brought to the ED initially for right hip pain following a fall off the commode 2 days prior.  She was able to get up and has been ambulating with her walker since.  States she hit her right hip and her head.  Patient denies being previously unwell prior to the fall.  Work-up has revealed multifocal pneumonia, as patient became increasingly tachypneic, she was started on BiPAP in the emergency department.    New events last 24 hours / Subjective: Feeling well, has no complaints.  Awaiting breakfast.  Assessment & Plan:   Principal Problem:   Acute respiratory failure (HCC) Active Problems:   Fall at home, initial encounter   AKI (acute kidney injury) (HCC)   Metabolic acidosis   Hyperkalemia   Right hip pain   Rapid atrial fibrillation (HCC)   Multifocal pneumonia   Sepsis (HCC)   Severe sepsis (HCC)   Dementia without behavioral disturbance (HCC)   Severe sepsis secondary to E. coli bacteremia, multifocal pneumonia -Sepsis present on admission -CTA chest: Scattered reticulonodular opacities which appear infectious/inflammatory. No evidence of pulmonary embolism  -Patient remains on IV rocephin/azithromycin   Acute hypoxemic respiratory failure -Required BiPAP for short period of time, now weaned off and on room air  AKI -Baseline creatinine 0.9 -Hold HCTZ, cozaar  -Resolved  A. fib RVR -Continue Eliquis, metoprolol  Fall at home -Right hip xray: Osteopenia and degenerative change without evidence of fractures. -PT recommended SNF placement  Elevated INR -Patient is not on coumadin, has normal LFTs, ?etiology, she is on eliquis  -Stable  Dementia -Takes Haldol, currently does live at home  alone  Hyponatremia -Improved   DVT prophylaxis:  apixaban (ELIQUIS) tablet 5 mg  Code Status: Full code Family Communication: Daughter over the phone 1/12 Disposition Plan:  Status is: Inpatient  Remains inpatient appropriate because: Medically stable and now off IV fluid.  SNF placement is pending    Antimicrobials:  Anti-infectives (From admission, onward)    Start     Dose/Rate Route Frequency Ordered Stop   02/06/21 0800  azithromycin (ZITHROMAX) 500 mg in sodium chloride 0.9 % 250 mL IVPB        500 mg 250 mL/hr over 60 Minutes Intravenous Every 24 hours 02/04/21 1013 02/11/21 0759   02/06/21 0800  cefTRIAXone (ROCEPHIN) 2 g in sodium chloride 0.9 % 100 mL IVPB  Status:  Discontinued        2 g 200 mL/hr over 30 Minutes Intravenous Every 24 hours 02/04/21 1013 02/04/21 1612   02/05/21 0400  cefTRIAXone (ROCEPHIN) 2 g in sodium chloride 0.9 % 100 mL IVPB        2 g 200 mL/hr over 30 Minutes Intravenous Every 24 hours 02/04/21 1612     02/04/21 2300  azithromycin (ZITHROMAX) 500 mg in sodium chloride 0.9 % 250 mL IVPB  Status:  Discontinued        500 mg 250 mL/hr over 60 Minutes Intravenous Every 24 hours 02/04/21 0500 02/04/21 1013   02/04/21 2200  cefTRIAXone (ROCEPHIN) 1 g in sodium chloride 0.9 % 100 mL IVPB  Status:  Discontinued        1 g 200 mL/hr over 30 Minutes Intravenous Every 24 hours  02/04/21 0500 02/04/21 1013   02/04/21 0430  levofloxacin (LEVAQUIN) IVPB 750 mg        750 mg 100 mL/hr over 90 Minutes Intravenous  Once 02/04/21 0422 02/04/21 0640        Objective: Vitals:   02/06/21 2006 02/07/21 0021 02/07/21 0453 02/07/21 0739  BP: 126/65 112/73 122/77 123/86  Pulse: 91 88 95 90  Resp: 18 (!) 22 (!) 24 17  Temp: 99.3 F (37.4 C) 97.6 F (36.4 C) 98.3 F (36.8 C) 98.1 F (36.7 C)  TempSrc:      SpO2: 96% 93% 94% 96%  Weight:      Height:        Intake/Output Summary (Last 24 hours) at 02/07/2021 1037 Last data filed at 02/07/2021  1016 Gross per 24 hour  Intake 4653.46 ml  Output 600 ml  Net 4053.46 ml    Filed Weights   02/03/21 2318  Weight: 72.6 kg    Examination:  General exam: Appears calm and comfortable  Respiratory system: Clear to auscultation. Respiratory effort normal. No respiratory distress. No conversational dyspnea. On room air  Cardiovascular system: S1 & S2 heard, regular rate and rhythm Gastrointestinal system: Abdomen is nondistended, soft and nontender. Normal bowel sounds heard. Central nervous system: Alert Extremities: Symmetric in appearance  Skin: No rashes, lesions or ulcers on exposed skin  Psychiatry: Mood & affect appropriate.   Data Reviewed: I have personally reviewed following labs and imaging studies  CBC: Recent Labs  Lab 02/03/21 2334 02/04/21 0440 02/05/21 0656 02/06/21 0631  WBC 17.3* 16.2* 11.4* 9.3  HGB 11.9* 10.8* 10.8* 11.2*  HCT 37.0 33.4* 31.9* 34.2*  MCV 84.3 85.4 81.6 82.0  PLT 176 153 147* 158    Basic Metabolic Panel: Recent Labs  Lab 02/03/21 2334 02/04/21 0440 02/04/21 0910 02/05/21 0656 02/06/21 0631 02/07/21 0443  NA 125* 129*  --  134* 135 134*  K 7.4* 5.5* 4.8 2.9* 4.0 3.7  CL 105 104  --  102 100 101  CO2 15* 18*  --  23 25 26   GLUCOSE 106* 115*  --  89 97 93  BUN 64* 63*  --  50* 43* 37*  CREATININE 1.49* 1.47*  --  1.34* 1.26* 1.00  CALCIUM 8.7* 8.6*  --  7.7* 7.9* 7.9*  MG  --   --   --   --  2.0  --     GFR: Estimated Creatinine Clearance: 36.2 mL/min (by C-G formula based on SCr of 1 mg/dL). Liver Function Tests: Recent Labs  Lab 02/03/21 2334 02/04/21 0440  AST 24 25  ALT 20 19  ALKPHOS 107 106  BILITOT 1.4* 1.2  PROT 6.6 5.7*  ALBUMIN 2.7* 2.4*    Recent Labs  Lab 02/03/21 2334  LIPASE 34    No results for input(s): AMMONIA in the last 168 hours. Coagulation Profile: Recent Labs  Lab 02/04/21 0440 02/05/21 0656 02/06/21 0631 02/07/21 0443  INR 2.2* 2.4* 2.0* 2.0*    Cardiac Enzymes: No results  for input(s): CKTOTAL, CKMB, CKMBINDEX, TROPONINI in the last 168 hours. BNP (last 3 results) No results for input(s): PROBNP in the last 8760 hours. HbA1C: No results for input(s): HGBA1C in the last 72 hours. CBG: No results for input(s): GLUCAP in the last 168 hours. Lipid Profile: No results for input(s): CHOL, HDL, LDLCALC, TRIG, CHOLHDL, LDLDIRECT in the last 72 hours. Thyroid Function Tests: No results for input(s): TSH, T4TOTAL, FREET4, T3FREE, THYROIDAB in the last 72  hours.  Anemia Panel: No results for input(s): VITAMINB12, FOLATE, FERRITIN, TIBC, IRON, RETICCTPCT in the last 72 hours. Sepsis Labs: Recent Labs  Lab 02/04/21 0440 02/04/21 0710 02/04/21 1257  PROCALCITON 8.71  --   --   LATICACIDVEN 3.9* 4.1* 1.8     Recent Results (from the past 240 hour(s))  Resp Panel by RT-PCR (Flu A&B, Covid) Nasopharyngeal Swab     Status: None   Collection Time: 02/03/21 11:34 PM   Specimen: Nasopharyngeal Swab; Nasopharyngeal(NP) swabs in vial transport medium  Result Value Ref Range Status   SARS Coronavirus 2 by RT PCR NEGATIVE NEGATIVE Final    Comment: (NOTE) SARS-CoV-2 target nucleic acids are NOT DETECTED.  The SARS-CoV-2 RNA is generally detectable in upper respiratory specimens during the acute phase of infection. The lowest concentration of SARS-CoV-2 viral copies this assay can detect is 138 copies/mL. A negative result does not preclude SARS-Cov-2 infection and should not be used as the sole basis for treatment or other patient management decisions. A negative result may occur with  improper specimen collection/handling, submission of specimen other than nasopharyngeal swab, presence of viral mutation(s) within the areas targeted by this assay, and inadequate number of viral copies(<138 copies/mL). A negative result must be combined with clinical observations, patient history, and epidemiological information. The expected result is Negative.  Fact Sheet for  Patients:  BloggerCourse.com  Fact Sheet for Healthcare Providers:  SeriousBroker.it  This test is no t yet approved or cleared by the [place] FDA and  has been authorized for detection and/or diagnosis of SARS-CoV-2 by FDA under an Emergency Use Authorization (EUA). This EUA will remain  in effect (meaning this test can be used) for the duration of the COVID-19 declaration under Section 564(b)(1) of the Act, 21 U.S.C.section 360bbb-3(b)(1), unless the authorization is terminated  or revoked sooner.       Influenza A by PCR NEGATIVE NEGATIVE Final   Influenza B by PCR NEGATIVE NEGATIVE Final    Comment: (NOTE) The Xpert Xpress SARS-CoV-2/FLU/RSV plus assay is intended as an aid in the diagnosis of influenza from Nasopharyngeal swab specimens and should not be used as a sole basis for treatment. Nasal washings and aspirates are unacceptable for Xpert Xpress SARS-CoV-2/FLU/RSV testing.  Fact Sheet for Patients: BloggerCourse.com  Fact Sheet for Healthcare Providers: SeriousBroker.it  This test is not yet approved or cleared by the [place] FDA and has been authorized for detection and/or diagnosis of SARS-CoV-2 by FDA under an Emergency Use Authorization (EUA). This EUA will remain in effect (meaning this test can be used) for the duration of the COVID-19 declaration under Section 564(b)(1) of the Act, 21 U.S.C. section 360bbb-3(b)(1), unless the authorization is terminated or revoked.  Performed at [facility], [address] [place], [place] [address]   Culture, blood (single)     Status: Abnormal   Collection Time: 02/04/21  4:40 AM   Specimen: BLOOD  Result Value Ref Range Status   Specimen Description   Final    BLOOD RIGHT ANTECUBITAL Performed at [facility], [address] [place], [place] [address]    Special Requests   Final     BOTTLES DRAWN AEROBIC AND ANAEROBIC Blood Culture adequate volume Performed at [facility], [address] [place], [place] [address]    Culture  Setup Time   Final    GRAM NEGATIVE RODS IN BOTH AEROBIC AND ANAEROBIC BOTTLES CRITICAL RESULT CALLED TO, READ BACK BY AND VERIFIED WITH: LISA KLUTTZ  AT 1543 ON 02/04/21 BY SS Performed at [facility], [address], [place], [place] [address]    Culture ESCHERICHIA COLI (A)  Final   Report Status 02/06/2021 FINAL  Final   Organism ID, Bacteria ESCHERICHIA COLI  Final      Susceptibility   Escherichia coli - MIC*    AMPICILLIN >=32 RESISTANT Resistant     CEFAZOLIN <=4 SENSITIVE Sensitive     CEFEPIME <=0.12 SENSITIVE Sensitive     CEFTAZIDIME <=1 SENSITIVE Sensitive     CEFTRIAXONE <=0.25 SENSITIVE Sensitive     CIPROFLOXACIN <=0.25 SENSITIVE Sensitive     GENTAMICIN <=1 SENSITIVE Sensitive     IMIPENEM <=0.25 SENSITIVE Sensitive     TRIMETH/SULFA >=320 RESISTANT Resistant     AMPICILLIN/SULBACTAM 16 INTERMEDIATE Intermediate     PIP/TAZO <=4 SENSITIVE Sensitive     * ESCHERICHIA COLI  Blood Culture ID Panel (Reflexed)     Status: Abnormal   Collection Time: 02/04/21  4:40 AM  Result Value Ref Range Status   Enterococcus faecalis NOT DETECTED NOT DETECTED Final   Enterococcus Faecium NOT DETECTED NOT DETECTED Final   Listeria monocytogenes NOT DETECTED NOT DETECTED Final   Staphylococcus species NOT DETECTED NOT DETECTED Final   Staphylococcus aureus (BCID) NOT DETECTED NOT DETECTED Final   Staphylococcus epidermidis NOT DETECTED NOT DETECTED Final   Staphylococcus lugdunensis NOT DETECTED NOT DETECTED Final   Streptococcus species NOT DETECTED NOT DETECTED Final   Streptococcus agalactiae NOT DETECTED NOT DETECTED Final   Streptococcus pneumoniae NOT DETECTED NOT DETECTED Final   Streptococcus pyogenes NOT DETECTED NOT DETECTED Final   A.calcoaceticus-baumannii NOT DETECTED NOT DETECTED Final   Bacteroides  fragilis NOT DETECTED NOT DETECTED Final   Enterobacterales DETECTED (A) NOT DETECTED Final    Comment: Enterobacterales represent a large order of gram negative bacteria, not a single organism. CRITICAL RESULT CALLED TO, READ BACK BY AND VERIFIED WITH: LISA KLUTTZ AT 1543 ON 02/04/21 BY SS    Enterobacter cloacae complex NOT DETECTED NOT DETECTED Final   Escherichia coli DETECTED (A) NOT DETECTED Final    Comment: CRITICAL RESULT CALLED TO, READ BACK BY AND VERIFIED WITH: LISA KLUTTZ AT 1543 ON 02/04/21 BY SS    Klebsiella aerogenes NOT DETECTED NOT DETECTED Final   Klebsiella oxytoca NOT DETECTED NOT DETECTED Final   Klebsiella pneumoniae NOT DETECTED NOT DETECTED Final   Proteus species NOT DETECTED NOT DETECTED Final   Salmonella species NOT DETECTED NOT DETECTED Final   Serratia marcescens NOT DETECTED NOT DETECTED Final   Haemophilus influenzae NOT DETECTED NOT DETECTED Final   Neisseria meningitidis NOT DETECTED NOT DETECTED Final   Pseudomonas aeruginosa NOT DETECTED NOT DETECTED Final   Stenotrophomonas maltophilia NOT DETECTED NOT DETECTED Final   Candida albicans NOT DETECTED NOT DETECTED Final   Candida auris NOT DETECTED NOT DETECTED Final   Candida glabrata NOT DETECTED NOT DETECTED Final   Candida krusei NOT DETECTED NOT DETECTED Final   Candida parapsilosis NOT DETECTED NOT DETECTED Final   Candida tropicalis NOT DETECTED NOT DETECTED Final   Cryptococcus neoformans/gattii NOT DETECTED NOT DETECTED Final   CTX-M ESBL NOT DETECTED NOT DETECTED Final   Carbapenem resistance IMP NOT DETECTED NOT DETECTED Final   Carbapenem resistance KPC NOT DETECTED NOT DETECTED Final   Carbapenem resistance NDM NOT DETECTED NOT DETECTED Final   Carbapenem resist OXA 48 LIKE NOT DETECTED NOT DETECTED Final   Carbapenem resistance VIM NOT DETECTED NOT DETECTED Final    Comment: Performed  at [facility], [address] [place], [place] [address]  Urine Culture     Status: None    Collection Time: 02/04/21  6:14 PM   Specimen: Urine, Random  Result Value Ref Range Status   Specimen Description   Final    URINE, RANDOM Performed at [facility], [address] [place], [place] [address]    Special Requests   Final    NONE Performed at [facility], [address] [place], [place] [address]    Culture   Final    NO GROWTH Performed at [facility], [address] [place], [place] [address]    Report Status 02/05/2021 FINAL  Final       Radiology Studies: No results found.    Scheduled Meds:  apixaban  5 mg Oral BID   haloperidol  0.5 mg Oral BID   metoprolol succinate  12.5 mg Oral Daily   Continuous Infusions:  azithromycin 500 mg (02/07/21 0905)   cefTRIAXone (ROCEPHIN)  IV Stopped (02/07/21 0414)     LOS: 3 days     Noralee StainJennifer Taurus Willis, DO [facility] 02/07/2021, 10:37 AM   Available via Epic secure chat 7am-7pm After these hours, please refer to coverage provider listed on [email or web address]

## 2021-02-08 DIAGNOSIS — J9601 Acute respiratory failure with hypoxia: Secondary | ICD-10-CM | POA: Diagnosis not present

## 2021-02-08 MED ORDER — SODIUM CHLORIDE 0.9 % IV SOLN: INTRAVENOUS | Status: DC | PRN

## 2021-02-08 MED ORDER — IPRATROPIUM-ALBUTEROL 0.5-2.5 (3) MG/3ML IN SOLN
3.0000 mL | RESPIRATORY_TRACT | Status: DC | PRN
  Administered 2021-02-08: 3 mL via RESPIRATORY_TRACT
  Filled 2021-02-08: qty 3

## 2021-02-08 NOTE — Progress Notes (Signed)
PROGRESS NOTE    Vanessa Logan  SFK:[id number] DOB: 1926-12-03 DOA: 02/03/2021 PCP: Marguarite Arbour, MD     Brief Narrative:  Vanessa Logan is a 88 y.o. female with medical history significant for HTN, A. fib on Eliquis, dementia, who lives alone was brought to the ED initially for right hip pain following a fall off the commode 2 days prior.  She was able to get up and has been ambulating with her walker since.  States she hit her right hip and her head.  Patient denies being previously unwell prior to the fall.  Work-up has revealed multifocal pneumonia, as patient became increasingly tachypneic, she was started on BiPAP in the emergency department.  Work up further revealed E Coli bacteremia, likely GI source due to diarrhea reported prior to admission. She was treated with IV antibiotics with improvement. PT recommended SNF placement.   New events last 24 hours / Subjective: No acute events reported. Patient feeling well, without complaints.   Assessment & Plan:   Principal Problem:   Acute respiratory failure (HCC) Active Problems:   Fall at home, initial encounter   AKI (acute kidney injury) (HCC)   Metabolic acidosis   Hyperkalemia   Right hip pain   Rapid atrial fibrillation (HCC)   Multifocal pneumonia   Sepsis (HCC)   Severe sepsis (HCC)   Dementia without behavioral disturbance (HCC)   Severe sepsis secondary to E. coli bacteremia, multifocal pneumonia -Sepsis present on admission -CTA chest: Scattered reticulonodular opacities which appear infectious/inflammatory. No evidence of pulmonary embolism  -Patient remains on IV rocephin/azithromycin x 5 days   Acute hypoxemic respiratory failure -Required BiPAP for short period of time, now weaned off and on room air  AKI -Baseline creatinine 0.9 -Hold HCTZ, cozaar  -Resolved  Paroxysmal A. fib RVR -Continue Eliquis, metoprolol  Fall at home -Right hip xray: Osteopenia and degenerative change without evidence of  fractures. -PT recommended SNF placement  Elevated INR -Patient is not on coumadin, has normal LFTs, ?etiology, she is on eliquis  -Stable  Dementia -Takes Haldol, currently does live at home alone  Hyponatremia -Improved   DVT prophylaxis:  apixaban (ELIQUIS) tablet 5 mg  Code Status: Full code Family Communication: None at bedside  Disposition Plan:  Status is: Inpatient  Remains inpatient appropriate because: SNF placement is pending    Antimicrobials:  Anti-infectives (From admission, onward)    Start     Dose/Rate Route Frequency Ordered Stop   02/06/21 0800  azithromycin (ZITHROMAX) 500 mg in sodium chloride 0.9 % 250 mL IVPB        500 mg 250 mL/hr over 60 Minutes Intravenous Every 24 hours 02/04/21 1013 02/11/21 0759   02/06/21 0800  cefTRIAXone (ROCEPHIN) 2 g in sodium chloride 0.9 % 100 mL IVPB  Status:  Discontinued        2 g 200 mL/hr over 30 Minutes Intravenous Every 24 hours 02/04/21 1013 02/04/21 1612   02/05/21 0400  cefTRIAXone (ROCEPHIN) 2 g in sodium chloride 0.9 % 100 mL IVPB        2 g 200 mL/hr over 30 Minutes Intravenous Every 24 hours 02/04/21 1612 02/10/21 0359   02/04/21 2300  azithromycin (ZITHROMAX) 500 mg in sodium chloride 0.9 % 250 mL IVPB  Status:  Discontinued        500 mg 250 mL/hr over 60 Minutes Intravenous Every 24 hours 02/04/21 0500 02/04/21 1013   02/04/21 2200  cefTRIAXone (ROCEPHIN) 1 g in sodium chloride 0.9 %  100 mL IVPB  Status:  Discontinued        1 g 200 mL/hr over 30 Minutes Intravenous Every 24 hours 02/04/21 0500 02/04/21 1013   02/04/21 0430  levofloxacin (LEVAQUIN) IVPB 750 mg        750 mg 100 mL/hr over 90 Minutes Intravenous  Once 02/04/21 0422 02/04/21 0640        Objective: Vitals:   02/07/21 2249 02/08/21 0103 02/08/21 0410 02/08/21 0756  BP: 128/87 131/88 116/83 139/90  Pulse: (!) 103 99 97 (!) 110  Resp: 20 19 20 20   Temp: 98.1 F (36.7 C) 98.4 F (36.9 C) 97.8 F (36.6 C) 98.7 F (37.1 C)   TempSrc: Oral     SpO2: 93% 94% 93% 94%  Weight:      Height:        Intake/Output Summary (Last 24 hours) at 02/08/2021 1028 Last data filed at 02/08/2021 0950 Gross per 24 hour  Intake 1210 ml  Output 2600 ml  Net -1390 ml    Filed Weights   02/03/21 2318  Weight: 72.6 kg    Examination:  General exam: Appears calm and comfortable  Respiratory system: Clear to auscultation. Respiratory effort normal. No respiratory distress. No conversational dyspnea. On room air  Cardiovascular system: S1 & S2 heard, irreg rhythm rate 101  Gastrointestinal system: Abdomen is nondistended, soft and nontender. Normal bowel sounds heard. Central nervous system: Alert Extremities: Symmetric in appearance  Skin: No rashes, lesions or ulcers on exposed skin  Psychiatry: Mood & affect appropriate.   Data Reviewed: I have personally reviewed following labs and imaging studies  CBC: Recent Labs  Lab 02/03/21 2334 02/04/21 0440 02/05/21 0656 02/06/21 0631  WBC 17.3* 16.2* 11.4* 9.3  HGB 11.9* 10.8* 10.8* 11.2*  HCT 37.0 33.4* 31.9* 34.2*  MCV 84.3 85.4 81.6 82.0  PLT 176 153 147* 158    Basic Metabolic Panel: Recent Labs  Lab 02/03/21 2334 02/04/21 0440 02/04/21 0910 02/05/21 0656 02/06/21 0631 02/07/21 0443  NA 125* 129*  --  134* 135 134*  K 7.4* 5.5* 4.8 2.9* 4.0 3.7  CL 105 104  --  102 100 101  CO2 15* 18*  --  23 25 26   GLUCOSE 106* 115*  --  89 97 93  BUN 64* 63*  --  50* 43* 37*  CREATININE 1.49* 1.47*  --  1.34* 1.26* 1.00  CALCIUM 8.7* 8.6*  --  7.7* 7.9* 7.9*  MG  --   --   --   --  2.0  --     GFR: Estimated Creatinine Clearance: 36.2 mL/min (by C-G formula based on SCr of 1 mg/dL). Liver Function Tests: Recent Labs  Lab 02/03/21 2334 02/04/21 0440  AST 24 25  ALT 20 19  ALKPHOS 107 106  BILITOT 1.4* 1.2  PROT 6.6 5.7*  ALBUMIN 2.7* 2.4*    Recent Labs  Lab 02/03/21 2334  LIPASE 34    No results for input(s): AMMONIA in the last 168  hours. Coagulation Profile: Recent Labs  Lab 02/04/21 0440 02/05/21 0656 02/06/21 0631 02/07/21 0443  INR 2.2* 2.4* 2.0* 2.0*    Cardiac Enzymes: No results for input(s): CKTOTAL, CKMB, CKMBINDEX, TROPONINI in the last 168 hours. BNP (last 3 results) No results for input(s): PROBNP in the last 8760 hours. HbA1C: No results for input(s): HGBA1C in the last 72 hours. CBG: No results for input(s): GLUCAP in the last 168 hours. Lipid Profile: No results for input(s): CHOL,  HDL, LDLCALC, TRIG, CHOLHDL, LDLDIRECT in the last 72 hours. Thyroid Function Tests: No results for input(s): TSH, T4TOTAL, FREET4, T3FREE, THYROIDAB in the last 72 hours.  Anemia Panel: No results for input(s): VITAMINB12, FOLATE, FERRITIN, TIBC, IRON, RETICCTPCT in the last 72 hours. Sepsis Labs: Recent Labs  Lab 02/04/21 0440 02/04/21 0710 02/04/21 1257  PROCALCITON 8.71  --   --   LATICACIDVEN 3.9* 4.1* 1.8     Recent Results (from the past 240 hour(s))  Resp Panel by RT-PCR (Flu A&B, Covid) Nasopharyngeal Swab     Status: None   Collection Time: 02/03/21 11:34 PM   Specimen: Nasopharyngeal Swab; Nasopharyngeal(NP) swabs in vial transport medium  Result Value Ref Range Status   SARS Coronavirus 2 by RT PCR NEGATIVE NEGATIVE Final    Comment: (NOTE) SARS-CoV-2 target nucleic acids are NOT DETECTED.  The SARS-CoV-2 RNA is generally detectable in upper respiratory specimens during the acute phase of infection. The lowest concentration of SARS-CoV-2 viral copies this assay can detect is 138 copies/mL. A negative result does not preclude SARS-Cov-2 infection and should not be used as the sole basis for treatment or other patient management decisions. A negative result may occur with  improper specimen collection/handling, submission of specimen other than nasopharyngeal swab, presence of viral mutation(s) within the areas targeted by this assay, and inadequate number of viral copies(<138  copies/mL). A negative result must be combined with clinical observations, patient history, and epidemiological information. The expected result is Negative.  Fact Sheet for Patients:  BloggerCourse.comhttps://www.fda.gov/media/152166/download  Fact Sheet for Healthcare Providers:  SeriousBroker.ithttps://www.fda.gov/media/152162/download  This test is no t yet approved or cleared by the [place] FDA and  has been authorized for detection and/or diagnosis of SARS-CoV-2 by FDA under an Emergency Use Authorization (EUA). This EUA will remain  in effect (meaning this test can be used) for the duration of the COVID-19 declaration under Section 564(b)(1) of the Act, 21 U.S.C.section 360bbb-3(b)(1), unless the authorization is terminated  or revoked sooner.       Influenza A by PCR NEGATIVE NEGATIVE Final   Influenza B by PCR NEGATIVE NEGATIVE Final    Comment: (NOTE) The Xpert Xpress SARS-CoV-2/FLU/RSV plus assay is intended as an aid in the diagnosis of influenza from Nasopharyngeal swab specimens and should not be used as a sole basis for treatment. Nasal washings and aspirates are unacceptable for Xpert Xpress SARS-CoV-2/FLU/RSV testing.  Fact Sheet for Patients: BloggerCourse.comhttps://www.fda.gov/media/152166/download  Fact Sheet for Healthcare Providers: SeriousBroker.ithttps://www.fda.gov/media/152162/download  This test is not yet approved or cleared by the [place] FDA and has been authorized for detection and/or diagnosis of SARS-CoV-2 by FDA under an Emergency Use Authorization (EUA). This EUA will remain in effect (meaning this test can be used) for the duration of the COVID-19 declaration under Section 564(b)(1) of the Act, 21 U.S.C. section 360bbb-3(b)(1), unless the authorization is terminated or revoked.  Performed at [facility], [address] [place], [place] [address]   Culture, blood (single)     Status: Abnormal   Collection Time: 02/04/21  4:40 AM   Specimen: BLOOD  Result Value Ref Range  Status   Specimen Description   Final    BLOOD RIGHT ANTECUBITAL Performed at [facility], [address] [place], [place] [address]    Special Requests   Final    BOTTLES DRAWN AEROBIC AND ANAEROBIC Blood Culture adequate volume Performed at [facility], [address] [place], [place] [address]    Culture  Setup Time  Final    GRAM NEGATIVE RODS IN BOTH AEROBIC AND ANAEROBIC BOTTLES CRITICAL RESULT CALLED TO, READ BACK BY AND VERIFIED WITH: LISA KLUTTZ AT 1543 ON 02/04/21 BY SS Performed at [facility], [address] [place], [place] [address]    Culture ESCHERICHIA COLI (A)  Final   Report Status 02/06/2021 FINAL  Final   Organism ID, Bacteria ESCHERICHIA COLI  Final      Susceptibility   Escherichia coli - MIC*    AMPICILLIN >=32 RESISTANT Resistant     CEFAZOLIN <=4 SENSITIVE Sensitive     CEFEPIME <=0.12 SENSITIVE Sensitive     CEFTAZIDIME <=1 SENSITIVE Sensitive     CEFTRIAXONE <=0.25 SENSITIVE Sensitive     CIPROFLOXACIN <=0.25 SENSITIVE Sensitive     GENTAMICIN <=1 SENSITIVE Sensitive     IMIPENEM <=0.25 SENSITIVE Sensitive     TRIMETH/SULFA >=320 RESISTANT Resistant     AMPICILLIN/SULBACTAM 16 INTERMEDIATE Intermediate     PIP/TAZO <=4 SENSITIVE Sensitive     * ESCHERICHIA COLI  Blood Culture ID Panel (Reflexed)     Status: Abnormal   Collection Time: 02/04/21  4:40 AM  Result Value Ref Range Status   Enterococcus faecalis NOT DETECTED NOT DETECTED Final   Enterococcus Faecium NOT DETECTED NOT DETECTED Final   Listeria monocytogenes NOT DETECTED NOT DETECTED Final   Staphylococcus species NOT DETECTED NOT DETECTED Final   Staphylococcus aureus (BCID) NOT DETECTED NOT DETECTED Final   Staphylococcus epidermidis NOT DETECTED NOT DETECTED Final   Staphylococcus lugdunensis NOT DETECTED NOT DETECTED Final   Streptococcus species NOT DETECTED NOT DETECTED Final   Streptococcus agalactiae NOT DETECTED NOT DETECTED Final   Streptococcus  pneumoniae NOT DETECTED NOT DETECTED Final   Streptococcus pyogenes NOT DETECTED NOT DETECTED Final   A.calcoaceticus-baumannii NOT DETECTED NOT DETECTED Final   Bacteroides fragilis NOT DETECTED NOT DETECTED Final   Enterobacterales DETECTED (A) NOT DETECTED Final    Comment: Enterobacterales represent a large order of gram negative bacteria, not a single organism. CRITICAL RESULT CALLED TO, READ BACK BY AND VERIFIED WITH: LISA KLUTTZ AT 1543 ON 02/04/21 BY SS    Enterobacter cloacae complex NOT DETECTED NOT DETECTED Final   Escherichia coli DETECTED (A) NOT DETECTED Final    Comment: CRITICAL RESULT CALLED TO, READ BACK BY AND VERIFIED WITH: LISA KLUTTZ AT 1543 ON 02/04/21 BY SS    Klebsiella aerogenes NOT DETECTED NOT DETECTED Final   Klebsiella oxytoca NOT DETECTED NOT DETECTED Final   Klebsiella pneumoniae NOT DETECTED NOT DETECTED Final   Proteus species NOT DETECTED NOT DETECTED Final   Salmonella species NOT DETECTED NOT DETECTED Final   Serratia marcescens NOT DETECTED NOT DETECTED Final   Haemophilus influenzae NOT DETECTED NOT DETECTED Final   Neisseria meningitidis NOT DETECTED NOT DETECTED Final   Pseudomonas aeruginosa NOT DETECTED NOT DETECTED Final   Stenotrophomonas maltophilia NOT DETECTED NOT DETECTED Final   Candida albicans NOT DETECTED NOT DETECTED Final   Candida auris NOT DETECTED NOT DETECTED Final   Candida glabrata NOT DETECTED NOT DETECTED Final   Candida krusei NOT DETECTED NOT DETECTED Final   Candida parapsilosis NOT DETECTED NOT DETECTED Final   Candida tropicalis NOT DETECTED NOT DETECTED Final   Cryptococcus neoformans/gattii NOT DETECTED NOT DETECTED Final   CTX-M ESBL NOT DETECTED NOT DETECTED Final   Carbapenem resistance IMP NOT DETECTED NOT DETECTED Final   Carbapenem resistance KPC NOT DETECTED NOT DETECTED Final   Carbapenem resistance NDM NOT DETECTED NOT DETECTED Final  Carbapenem resist OXA 48 LIKE NOT DETECTED NOT DETECTED Final    Carbapenem resistance VIM NOT DETECTED NOT DETECTED Final    Comment: Performed at [facility], [address], [place], [place] [address]  Urine Culture     Status: None   Collection Time: 02/04/21  6:14 PM   Specimen: Urine, Random  Result Value Ref Range Status   Specimen Description   Final    URINE, RANDOM Performed at [facility], [address] [place], [place] [address]    Special Requests   Final    NONE Performed at [facility], [address], [place], [place] [address]    Culture   Final    NO GROWTH Performed at [facility], [address], [place], [place] [address]    Report Status 02/05/2021 FINAL  Final       Radiology Studies: No results found.    Scheduled Meds:  apixaban  5 mg Oral BID   haloperidol  0.5 mg Oral BID   metoprolol succinate  12.5 mg Oral Daily   Continuous Infusions:  sodium chloride 10 mL/hr at 02/08/21 0448   azithromycin Stopped (02/07/21 1132)   cefTRIAXone (ROCEPHIN)  IV 2 g (02/08/21 0453)     LOS: 4 days     Noralee Stain, DO [facility] 02/08/2021, 10:28 AM   Available via Epic secure chat 7am-7pm After these hours, please refer to coverage provider listed on [email or web address]

## 2021-02-08 NOTE — TOC Progression Note (Addendum)
Transition of Care [facility]) - Progression Note    Patient Details  Name: Vanessa Logan MRN: [id number] Date of Birth: 08/19/1926  [facility] CM/SW Contact  Gildardo Griffes, [place] Phone Number: 02/08/2021, 1:17 PM  Clinical Narrative:     CSW has lvm with daughter Olegario Messier to provide bed offers, pending call back.   CSW has reached out to preference of [facility] they report they do not work weekends but will review referral that was sent Friday on Monday.   CSW notes patient currently has bed offers from  [facility], [place], and [facility].   Update: Olegario Messier called back and reports she will speak to her brother about bed offers and let me know.  Update:Kathy called back reports [facility] is good unless we hear back from [facility]. CSW has reached out to [facility] however has received no response today.    Expected Discharge Plan: Skilled Nursing Facility Barriers to Discharge: Continued Medical Work up  Expected Discharge Plan and Services Expected Discharge Plan: Skilled Nursing Facility       Living arrangements for the past 2 months: Single Family Home                                       Social Determinants of Health (SDOH) Interventions    Readmission Risk Interventions No flowsheet data found.

## 2021-02-09 ENCOUNTER — Inpatient Hospital Stay: Payer: Medicare Other

## 2021-02-09 LAB — CBC
HCT: 37.1 % (ref 36.0–46.0)
Hemoglobin: 12.4 g/dL (ref 12.0–15.0)
MCH: 27.7 pg (ref 26.0–34.0)
MCHC: 33.4 g/dL (ref 30.0–36.0)
MCV: 82.8 fL (ref 80.0–100.0)
Platelets: 256 10*3/uL (ref 150–400)
RBC: 4.48 MIL/uL (ref 3.87–5.11)
RDW: 13.9 % (ref 11.5–15.5)
WBC: 10.4 10*3/uL (ref 4.0–10.5)
nRBC: 0 % (ref 0.0–0.2)

## 2021-02-09 LAB — TYPE AND SCREEN
ABO/RH(D): A POS
Antibody Screen: NEGATIVE

## 2021-02-09 LAB — HEMOGLOBIN AND HEMATOCRIT, BLOOD
HCT: 41 % (ref 36.0–46.0)
Hemoglobin: 13.6 g/dL (ref 12.0–15.0)

## 2021-02-09 LAB — BASIC METABOLIC PANEL
Anion gap: 7 (ref 5–15)
BUN: 31 mg/dL — ABNORMAL HIGH (ref 8–23)
CO2: 26 mmol/L (ref 22–32)
Calcium: 7.7 mg/dL — ABNORMAL LOW (ref 8.9–10.3)
Chloride: 101 mmol/L (ref 98–111)
Creatinine, Ser: 0.85 mg/dL (ref 0.44–1.00)
GFR, Estimated: >60 mL/min (ref >60)
Glucose, Bld: 106 mg/dL — ABNORMAL HIGH (ref 70–99)
Potassium: 3.8 mmol/L (ref 3.5–5.1)
Sodium: 134 mmol/L — ABNORMAL LOW (ref 135–145)

## 2021-02-09 LAB — PROTIME-INR
INR: 2.8 — ABNORMAL HIGH (ref 0.8–1.2)
INR: 2.3 — ABNORMAL HIGH (ref 0.8–1.2)
Prothrombin Time: 29.9 seconds — ABNORMAL HIGH (ref 11.4–15.2)
Prothrombin Time: 25.1 seconds — ABNORMAL HIGH (ref 11.4–15.2)

## 2021-02-09 MED ORDER — FUROSEMIDE 10 MG/ML IJ SOLN
40.0000 mg | Freq: Once | INTRAMUSCULAR | Status: AC
  Administered 2021-02-09: 40 mg via INTRAVENOUS
  Filled 2021-02-09: qty 4

## 2021-02-09 MED ORDER — METOPROLOL SUCCINATE ER 25 MG PO TB24
25.0000 mg | ORAL_TABLET | Freq: Every day | ORAL | Status: DC
  Administered 2021-02-10 – 2021-02-12 (×3): 25 mg via ORAL
  Filled 2021-02-09 (×3): qty 1

## 2021-02-09 MED ORDER — PANTOPRAZOLE SODIUM 40 MG IV SOLR
40.0000 mg | Freq: Two times a day (BID) | INTRAVENOUS | Status: DC
  Administered 2021-02-09 – 2021-02-12 (×6): 40 mg via INTRAVENOUS
  Filled 2021-02-09 (×6): qty 40

## 2021-02-09 NOTE — TOC Progression Note (Signed)
Transition of Care [facility]) - Progression Note    Patient Details  Name: Vanessa Logan MRN: [id number] Date of Birth: [birth year]  Transition of Care [facility]) CM/SW [place], [place] Phone Number: 02/09/2021, 10:24 AM  Clinical Narrative:     CSW spoke to patient's daughter Juliann Pulse to inform of [facility] being able to take, she reports they have now decided on [facility].   Per MD patient not medically stable today.   Plan for dc to [facility] when patient is medically stable.    Expected Discharge Plan: [place] Barriers to Discharge: Continued Medical Work up  Expected Discharge Plan and Services Expected Discharge Plan: [place] arrangements for the past 2 months: Single Family Home                                       Social Determinants of Health (SDOH) Interventions    Readmission Risk Interventions No flowsheet data found.

## 2021-02-09 NOTE — Progress Notes (Addendum)
PROGRESS NOTE    Vanessa Logan  ZOX:[id number] DOB: 06-14-1926 DOA: 02/03/2021 PCP: Marguarite ArbourSparks, Jeffrey D, MD     Brief Narrative:  Vanessa Logan is a 88 y.o. female with medical history significant for HTN, A. fib on Eliquis, dementia, who lives alone was brought to the ED initially for right hip pain following a fall off the commode 2 days prior.  She was able to get up and has been ambulating with her walker since.  States she hit her right hip and her head.  Patient denies being previously unwell prior to the fall.  Work-up has revealed multifocal pneumonia, as patient became increasingly tachypneic, she was started on BiPAP in the emergency department.  Work up further revealed E Coli bacteremia, likely GI source due to diarrhea reported prior to admission. She was treated with IV antibiotics with improvement. PT recommended SNF placement.   New events last 24 hours / Subjective: Patient states she is not feeling well this morning, but unable to elaborate. PT notes that when working with her this morning, patient had watery and loose stools and also noted bright red blood. She also had some SOB when working with PT.   Assessment & Plan:   Principal Problem:   Acute respiratory failure (HCC) Active Problems:   Fall at home, initial encounter   AKI (acute kidney injury) (HCC)   Metabolic acidosis   Hyperkalemia   Right hip pain   Rapid atrial fibrillation (HCC)   Multifocal pneumonia   Sepsis (HCC)   Severe sepsis (HCC)   Dementia without behavioral disturbance (HCC)   Severe sepsis secondary to E. coli bacteremia, multifocal pneumonia -Sepsis present on admission -CTA chest: Scattered reticulonodular opacities which appear infectious/inflammatory. No evidence of pulmonary embolism  -Patient remains on IV rocephin/azithromycin x 5 days   Acute hypoxemic respiratory failure -Required BiPAP for short period of time, now weaned off and on room air -Repeat CXR was personally reviewed.  Shows worsening aeration bilateral lung fields. Ordered 1 dose IV lasix today. Check echo   AKI -Baseline creatinine 0.9 -Hold HCTZ, cozaar  -Resolved  Paroxysmal A. fib RVR -Continue metoprolol -Eliquis on hold   Fall at home -Right hip xray: Osteopenia and degenerative change without evidence of fractures. -PT recommended SNF placement  Elevated INR -Patient is not on coumadin, has normal LFTs, ?etiology, she is on eliquis  -Stable  Dementia -Takes Haldol, currently does live at home alone  Hyponatremia -Improved  Blood loss -Check FOBT. Hgb stable today. Hold eliquis. Repeat INR  -Addendum 1/16 4:40pm: was notified by RN patient having large bloody BMs this afternoon. Repeat H&H, INR, type&screen, get GI consult    DVT prophylaxis: Eliquis on hold   Code Status: Full code Family Communication: Updated daughter over the phone today  Disposition Plan:  Status is: Inpatient  Remains inpatient appropriate because: IV lasix today. Not medically stable to dc today, maybe 1-2 days to SNF.     Antimicrobials:  Anti-infectives (From admission, onward)    Start     Dose/Rate Route Frequency Ordered Stop   02/06/21 0800  azithromycin (ZITHROMAX) 500 mg in sodium chloride 0.9 % 250 mL IVPB        500 mg 250 mL/hr over 60 Minutes Intravenous Every 24 hours 02/04/21 1013 02/11/21 0759   02/06/21 0800  cefTRIAXone (ROCEPHIN) 2 g in sodium chloride 0.9 % 100 mL IVPB  Status:  Discontinued        2 g 200 mL/hr over 30  Minutes Intravenous Every 24 hours 02/04/21 1013 02/04/21 1612   02/05/21 0400  cefTRIAXone (ROCEPHIN) 2 g in sodium chloride 0.9 % 100 mL IVPB        2 g 200 mL/hr over 30 Minutes Intravenous Every 24 hours 02/04/21 1612 02/09/21 0338   02/04/21 2300  azithromycin (ZITHROMAX) 500 mg in sodium chloride 0.9 % 250 mL IVPB  Status:  Discontinued        500 mg 250 mL/hr over 60 Minutes Intravenous Every 24 hours 02/04/21 0500 02/04/21 1013   02/04/21 2200   cefTRIAXone (ROCEPHIN) 1 g in sodium chloride 0.9 % 100 mL IVPB  Status:  Discontinued        1 g 200 mL/hr over 30 Minutes Intravenous Every 24 hours 02/04/21 0500 02/04/21 1013   02/04/21 0430  levofloxacin (LEVAQUIN) IVPB 750 mg        750 mg 100 mL/hr over 90 Minutes Intravenous  Once 02/04/21 0422 02/04/21 0640        Objective: Vitals:   02/08/21 1603 02/08/21 1958 02/09/21 0014 02/09/21 0541  BP: (!) 145/89 136/90 126/77 (!) 148/91  Pulse: (!) 107 94 (!) 109 97  Resp: 19 20 17 18   Temp: 98.3 F (36.8 C) 98.1 F (36.7 C) 97.6 F (36.4 C) 98.1 F (36.7 C)  TempSrc:    Oral  SpO2: 95% 94% 94% 94%  Weight:      Height:        Intake/Output Summary (Last 24 hours) at 02/09/2021 1141 Last data filed at 02/08/2021 1830 Gross per 24 hour  Intake 600 ml  Output --  Net 600 ml    Filed Weights   02/03/21 2318  Weight: 72.6 kg    Examination:  General exam: Appears calm and comfortable  Respiratory system: +Rhonchi and wheezes. Respiratory effort normal. No respiratory distress. On room air  Cardiovascular system: S1 & S2 heard, irreg rhythm rate 100, +trace pedal edema  Gastrointestinal system: Abdomen is nondistended, soft and nontender. Normal bowel sounds heard. Central nervous system: Alert Extremities: Symmetric in appearance  Skin: No rashes, lesions or ulcers on exposed skin   Data Reviewed: I have personally reviewed following labs and imaging studies  CBC: Recent Labs  Lab 02/03/21 2334 02/04/21 0440 02/05/21 0656 02/06/21 0631 02/09/21 1114  WBC 17.3* 16.2* 11.4* 9.3 10.4  HGB 11.9* 10.8* 10.8* 11.2* 12.4  HCT 37.0 33.4* 31.9* 34.2* 37.1  MCV 84.3 85.4 81.6 82.0 82.8  PLT 176 153 147* 158 256    Basic Metabolic Panel: Recent Labs  Lab 02/03/21 2334 02/04/21 0440 02/04/21 0910 02/05/21 0656 02/06/21 0631 02/07/21 0443  NA 125* 129*  --  134* 135 134*  K 7.4* 5.5* 4.8 2.9* 4.0 3.7  CL 105 104  --  102 100 101  CO2 15* 18*  --  23 25 26    GLUCOSE 106* 115*  --  89 97 93  BUN 64* 63*  --  50* 43* 37*  CREATININE 1.49* 1.47*  --  1.34* 1.26* 1.00  CALCIUM 8.7* 8.6*  --  7.7* 7.9* 7.9*  MG  --   --   --   --  2.0  --     GFR: Estimated Creatinine Clearance: 36.2 mL/min (by C-G formula based on SCr of 1 mg/dL). Liver Function Tests: Recent Labs  Lab 02/03/21 2334 02/04/21 0440  AST 24 25  ALT 20 19  ALKPHOS 107 106  BILITOT 1.4* 1.2  PROT 6.6 5.7*  ALBUMIN 2.7*  2.4*    Recent Labs  Lab 02/03/21 2334  LIPASE 34    No results for input(s): AMMONIA in the last 168 hours. Coagulation Profile: Recent Labs  Lab 02/04/21 0440 02/05/21 0656 02/06/21 0631 02/07/21 0443 02/09/21 1114  INR 2.2* 2.4* 2.0* 2.0* 2.8*    Cardiac Enzymes: No results for input(s): CKTOTAL, CKMB, CKMBINDEX, TROPONINI in the last 168 hours. BNP (last 3 results) No results for input(s): PROBNP in the last 8760 hours. HbA1C: No results for input(s): HGBA1C in the last 72 hours. CBG: No results for input(s): GLUCAP in the last 168 hours. Lipid Profile: No results for input(s): CHOL, HDL, LDLCALC, TRIG, CHOLHDL, LDLDIRECT in the last 72 hours. Thyroid Function Tests: No results for input(s): TSH, T4TOTAL, FREET4, T3FREE, THYROIDAB in the last 72 hours.  Anemia Panel: No results for input(s): VITAMINB12, FOLATE, FERRITIN, TIBC, IRON, RETICCTPCT in the last 72 hours. Sepsis Labs: Recent Labs  Lab 02/04/21 0440 02/04/21 0710 02/04/21 1257  PROCALCITON 8.71  --   --   LATICACIDVEN 3.9* 4.1* 1.8     Recent Results (from the past 240 hour(s))  Resp Panel by RT-PCR (Flu A&B, Covid) Nasopharyngeal Swab     Status: None   Collection Time: 02/03/21 11:34 PM   Specimen: Nasopharyngeal Swab; Nasopharyngeal(NP) swabs in vial transport medium  Result Value Ref Range Status   SARS Coronavirus 2 by RT PCR NEGATIVE NEGATIVE Final    Comment: (NOTE) SARS-CoV-2 target nucleic acids are NOT DETECTED.  The SARS-CoV-2 RNA is generally  detectable in upper respiratory specimens during the acute phase of infection. The lowest concentration of SARS-CoV-2 viral copies this assay can detect is 138 copies/mL. A negative result does not preclude SARS-Cov-2 infection and should not be used as the sole basis for treatment or other patient management decisions. A negative result may occur with  improper specimen collection/handling, submission of specimen other than nasopharyngeal swab, presence of viral mutation(s) within the areas targeted by this assay, and inadequate number of viral copies(<138 copies/mL). A negative result must be combined with clinical observations, patient history, and epidemiological information. The expected result is Negative.  Fact Sheet for Patients:  BloggerCourse.com  Fact Sheet for Healthcare Providers:  SeriousBroker.it  This test is no t yet approved or cleared by the [place] FDA and  has been authorized for detection and/or diagnosis of SARS-CoV-2 by FDA under an Emergency Use Authorization (EUA). This EUA will remain  in effect (meaning this test can be used) for the duration of the COVID-19 declaration under Section 564(b)(1) of the Act, 21 U.S.C.section 360bbb-3(b)(1), unless the authorization is terminated  or revoked sooner.       Influenza A by PCR NEGATIVE NEGATIVE Final   Influenza B by PCR NEGATIVE NEGATIVE Final    Comment: (NOTE) The Xpert Xpress SARS-CoV-2/FLU/RSV plus assay is intended as an aid in the diagnosis of influenza from Nasopharyngeal swab specimens and should not be used as a sole basis for treatment. Nasal washings and aspirates are unacceptable for Xpert Xpress SARS-CoV-2/FLU/RSV testing.  Fact Sheet for Patients: BloggerCourse.com  Fact Sheet for Healthcare Providers: SeriousBroker.it  This test is not yet approved or cleared by the [place] FDA  and has been authorized for detection and/or diagnosis of SARS-CoV-2 by FDA under an Emergency Use Authorization (EUA). This EUA will remain in effect (meaning this test can be used) for the duration of the COVID-19 declaration under Section 564(b)(1) of the Act, 21 U.S.C. section 360bbb-3(b)(1), unless the authorization is  terminated or revoked.  Performed at [facility], [address] [place], [place] [address]   Culture, blood (single)     Status: Abnormal   Collection Time: 02/04/21  4:40 AM   Specimen: BLOOD  Result Value Ref Range Status   Specimen Description   Final    BLOOD RIGHT ANTECUBITAL Performed at [facility], [address] [place], [place] [address]    Special Requests   Final    BOTTLES DRAWN AEROBIC AND ANAEROBIC Blood Culture adequate volume Performed at [facility], [address] [place], [place] [address]    Culture  Setup Time   Final    GRAM NEGATIVE RODS IN BOTH AEROBIC AND ANAEROBIC BOTTLES CRITICAL RESULT CALLED TO, READ BACK BY AND VERIFIED WITH: LISA KLUTTZ AT 1543 ON 02/04/21 BY SS Performed at [facility], [address] [place], [place] [address]    Culture ESCHERICHIA COLI (A)  Final   Report Status 02/06/2021 FINAL  Final   Organism ID, Bacteria ESCHERICHIA COLI  Final      Susceptibility   Escherichia coli - MIC*    AMPICILLIN >=32 RESISTANT Resistant     CEFAZOLIN <=4 SENSITIVE Sensitive     CEFEPIME <=0.12 SENSITIVE Sensitive     CEFTAZIDIME <=1 SENSITIVE Sensitive     CEFTRIAXONE <=0.25 SENSITIVE Sensitive     CIPROFLOXACIN <=0.25 SENSITIVE Sensitive     GENTAMICIN <=1 SENSITIVE Sensitive     IMIPENEM <=0.25 SENSITIVE Sensitive     TRIMETH/SULFA >=320 RESISTANT Resistant     AMPICILLIN/SULBACTAM 16 INTERMEDIATE Intermediate     PIP/TAZO <=4 SENSITIVE Sensitive     * ESCHERICHIA COLI  Blood Culture ID Panel (Reflexed)     Status: Abnormal   Collection Time: 02/04/21  4:40 AM  Result  Value Ref Range Status   Enterococcus faecalis NOT DETECTED NOT DETECTED Final   Enterococcus Faecium NOT DETECTED NOT DETECTED Final   Listeria monocytogenes NOT DETECTED NOT DETECTED Final   Staphylococcus species NOT DETECTED NOT DETECTED Final   Staphylococcus aureus (BCID) NOT DETECTED NOT DETECTED Final   Staphylococcus epidermidis NOT DETECTED NOT DETECTED Final   Staphylococcus lugdunensis NOT DETECTED NOT DETECTED Final   Streptococcus species NOT DETECTED NOT DETECTED Final   Streptococcus agalactiae NOT DETECTED NOT DETECTED Final   Streptococcus pneumoniae NOT DETECTED NOT DETECTED Final   Streptococcus pyogenes NOT DETECTED NOT DETECTED Final   A.calcoaceticus-baumannii NOT DETECTED NOT DETECTED Final   Bacteroides fragilis NOT DETECTED NOT DETECTED Final   Enterobacterales DETECTED (A) NOT DETECTED Final    Comment: Enterobacterales represent a large order of gram negative bacteria, not a single organism. CRITICAL RESULT CALLED TO, READ BACK BY AND VERIFIED WITH: LISA KLUTTZ AT 1543 ON 02/04/21 BY SS    Enterobacter cloacae complex NOT DETECTED NOT DETECTED Final   Escherichia coli DETECTED (A) NOT DETECTED Final    Comment: CRITICAL RESULT CALLED TO, READ BACK BY AND VERIFIED WITH: LISA KLUTTZ AT 1543 ON 02/04/21 BY SS    Klebsiella aerogenes NOT DETECTED NOT DETECTED Final   Klebsiella oxytoca NOT DETECTED NOT DETECTED Final   Klebsiella pneumoniae NOT DETECTED NOT DETECTED Final   Proteus species NOT DETECTED NOT DETECTED Final   Salmonella species NOT DETECTED NOT DETECTED Final   Serratia marcescens NOT DETECTED NOT DETECTED Final   Haemophilus influenzae NOT DETECTED NOT DETECTED Final   Neisseria meningitidis NOT DETECTED NOT DETECTED Final   Pseudomonas aeruginosa NOT DETECTED NOT DETECTED Final  Stenotrophomonas maltophilia NOT DETECTED NOT DETECTED Final   Candida albicans NOT DETECTED NOT DETECTED Final   Candida auris NOT DETECTED NOT DETECTED Final    Candida glabrata NOT DETECTED NOT DETECTED Final   Candida krusei NOT DETECTED NOT DETECTED Final   Candida parapsilosis NOT DETECTED NOT DETECTED Final   Candida tropicalis NOT DETECTED NOT DETECTED Final   Cryptococcus neoformans/gattii NOT DETECTED NOT DETECTED Final   CTX-M ESBL NOT DETECTED NOT DETECTED Final   Carbapenem resistance IMP NOT DETECTED NOT DETECTED Final   Carbapenem resistance KPC NOT DETECTED NOT DETECTED Final   Carbapenem resistance NDM NOT DETECTED NOT DETECTED Final   Carbapenem resist OXA 48 LIKE NOT DETECTED NOT DETECTED Final   Carbapenem resistance VIM NOT DETECTED NOT DETECTED Final    Comment: Performed at [facility], [address] [place], [place] [address]  Urine Culture     Status: None   Collection Time: 02/04/21  6:14 PM   Specimen: Urine, Random  Result Value Ref Range Status   Specimen Description   Final    URINE, RANDOM Performed at [facility], [address], [place], [place] [address]    Special Requests   Final    NONE Performed at [facility], [address], [place], [place] [address]    Culture   Final    NO GROWTH Performed at [facility], [address], [place], [place] [address]    Report Status 02/05/2021 FINAL  Final       Radiology Studies: DG Chest Port 1 View  Result Date: 02/09/2021 CLINICAL DATA:  Community-acquired pneumonia EXAM: PORTABLE CHEST 1 VIEW COMPARISON:  02/04/2021 FINDINGS: Cardiomegaly. Slight interval worsening of bilateral heterogeneous and interstitial airspace opacity, with probable new, small bilateral pleural effusions. The visualized skeletal structures are unremarkable. IMPRESSION: Slight interval worsening of bilateral heterogeneous and interstitial airspace opacity, with probable new, small bilateral pleural effusions. Findings are consistent with worsened infection. Electronically Signed   By: Jearld Lesch M.D.   On: 02/09/2021 10:06      Scheduled  Meds:  furosemide  40 mg Intravenous Once   haloperidol  0.5 mg Oral BID   [START ON 02/10/2021] metoprolol succinate  25 mg Oral Daily   Continuous Infusions:  sodium chloride 10 mL/hr at 02/09/21 0307   azithromycin 500 mg (02/09/21 0930)     LOS: 5 days     Noralee Stain, DO [facility] 02/09/2021, 11:41 AM   Available via Epic secure chat 7am-7pm After these hours, please refer to coverage provider listed on [email or web address]

## 2021-02-09 NOTE — Progress Notes (Signed)
Physical Therapy Treatment Patient Details Name: Vanessa Logan MRN: [id number] DOB: Feb 09, 1926 Today's Date: 02/09/2021   History of Present Illness Vanessa Logan is a 89yoF who [facility] after persisten right hip pain s/p fall off commode 2 days PTA. PMH: HTN, AF on eliquis, demention. hip pelvis imaging unrevealing of any fracture. Pt has been able to AMB and weight bear with RW. Workup revealing of PNA.    PT Comments    Pt in bed. Stated she had a BM.  Care provided for full linen change.  Stool very watery and loose but also noted a significant amount of bright red blood.  RN and MD aware.  Mobility deferred at this time given new bloody stool.  Of note she does c/o some increased SOB - sats 90% at rest on room air.   Recommendations for follow up therapy are one component of a multi-disciplinary discharge planning process, led by the attending physician.  Recommendations may be updated based on patient status, additional functional criteria and insurance authorization.  Follow Up Recommendations  Skilled nursing-short term rehab (<3 hours/day)     Assistance Recommended at Discharge    Patient can return home with the following Two people to help with walking and/or transfers;Two people to help with bathing/dressing/bathroom;Assistance with cooking/housework;Assistance with feeding;Assist for transportation   Equipment Recommendations  None recommended by PT    Recommendations for Other Services       Precautions / Restrictions Precautions Precautions: Fall Restrictions Weight Bearing Restrictions: No     Mobility  Bed Mobility Overal bed mobility: Needs Assistance Bed Mobility: Rolling Rolling: Min assist         General bed mobility comments: does well with rails able to hold position in sidelying    Transfers                   General transfer comment: deferred    Ambulation/Gait                   Stairs              Wheelchair Mobility    Modified Rankin (Stroke Patients Only)       Balance                                            Cognition Arousal/Alertness: Awake/alert Behavior During Therapy: WFL for tasks assessed/performed Overall Cognitive Status: Within Functional Limits for tasks assessed                                          Exercises Other Exercises Other Exercises: care for loose bloody BM    General Comments        Pertinent Vitals/Pain Pain Assessment: No/denies pain    Home Living                          Prior Function            PT Goals (current goals can now be found in the care plan section) Progress towards PT goals: Progressing toward goals    Frequency    Min 2X/week      PT Plan Current plan remains appropriate    Co-evaluation  AM-PAC PT "6 Clicks" Mobility   Outcome Measure  Help needed turning from your back to your side while in a flat bed without using bedrails?: A Little Help needed moving from lying on your back to sitting on the side of a flat bed without using bedrails?: A Lot Help needed moving to and from a bed to a chair (including a wheelchair)?: A Lot Help needed standing up from a chair using your arms (e.g., wheelchair or bedside chair)?: A Lot Help needed to walk in hospital room?: A Lot Help needed climbing 3-5 steps with a railing? : Total 6 Click Score: 12    End of Session   Activity Tolerance: Patient tolerated treatment well Patient left: in bed;with call bell/phone within reach;with bed alarm set Nurse Communication: Other (comment) PT Visit Diagnosis: Unsteadiness on feet (R26.81);Difficulty in walking, not elsewhere classified (R26.2);Muscle weakness (generalized) (M62.81);Dizziness and giddiness (R42);Other symptoms and signs involving the nervous system (B01.751)     Time: 0258-5277 PT Time Calculation (min) (ACUTE ONLY): 24  min  Charges:  $Therapeutic Activity: 23-37 mins                    Danielle Dess, PTA 02/09/21, 9:25 AM

## 2021-02-09 NOTE — Consult Note (Signed)
Consultation  Referring Provider:   Hospitalist Admit date: 02/03/2021 Consult date: 1/16         Reason for Consultation:  Hematochezia            HPI:   Vanessa Logan is a 88 y.o. lady with history of mild dementia and a. Fib on NOAC who was originally admitted to the hospital after mechanical fall and found to have severe pneumonia which required BIPAP which she has been weaned off. Today she has had diarrhea and several episodes of hematochezia. Hemoglobin has been stable (although expect drop tomorrow as she is likely hemoconcentrated) and she has been hemodynamically stable. Nurse notes the blood feels diaper and questions if it's vaginal due to location of blood in diaper. Regardless, patient had colonoscopy in 2013 with two small polyps and diverticulosis. No significant abdominal pain currently. Patient had repeat x-ray today with possible worsening of pneumonia. Last dose of NOAC was this morning. INR earlier today was 2.8 but on repeat it's 2.3.   Past Medical History:  Diagnosis Date   Atrial fibrillation (HCC)    Breast screening, unspecified 06/18/11   Diffuse cystic mastopathy    Family history of malignant neoplasm of breast 2013   Obesity, unspecified 2013   Screening for obesity 2013   Special screening for malignant neoplasms, colon 06/21/2011   Unspecified essential hypertension     Past Surgical History:  Procedure Laterality Date   APPENDECTOMY  1950   BREAST BIOPSY Bilateral 1992   neg   BREAST BIOPSY Right 1996   neg   BREAST BIOPSY Right 2005   neg   COLONOSCOPY  2007   COLONOSCOPY  2013   Dr Ricki RodriguezSkulski   COLONOSCOPY W/ POLYPECTOMY  2004   ORIF WRIST FRACTURE Left 04/15/2020   Procedure: OPEN REDUCTION INTERNAL FIXATION LEFT DISTAL RADIUS FRACTION;  Surgeon: Kennedy BuckerMenz, Michael, MD;  Location: [facility] ORS;  Service: Orthopedics;  Laterality: Left;   VARICOSE VEIN SURGERY  1983    Family History  Problem Relation Age of Onset   Breast cancer Sister 760   Pancreatic  cancer Sister    Colon cancer Daughter    Breast cancer Sister 5060    Social History   Tobacco Use   Smoking status: Never   Smokeless tobacco: Never  Substance Use Topics   Alcohol use: No    Alcohol/week: 0.0 standard drinks   Drug use: No    Prior to Admission medications   Medication Sig Start Date End Date Taking? Authorizing Provider  acetaminophen (TYLENOL) 500 MG tablet Take 500 mg by mouth every 6 (six) hours as needed for moderate pain or mild pain.   Yes [provider]  apixaban (ELIQUIS) 5 MG TABS tablet Take 5 mg by mouth 2 (two) times daily.   Yes [provider]  hydrochlorothiazide (HYDRODIURIL) 50 MG tablet Take 50 mg by mouth daily.   Yes [provider]  losartan (COZAAR) 50 MG tablet Take 1 tablet by mouth daily. 08/26/14  Yes [provider]  Magnesium 250 MG TABS Take 250 mg by mouth 2 (two) times daily.   Yes [provider]  metoprolol succinate (TOPROL-XL) 25 MG 24 hr tablet Take 12.5 mg by mouth at bedtime.    Yes [provider]  oxybutynin (DITROPAN-XL) 10 MG 24 hr tablet Take 10 mg by mouth 2 (two) times daily. 08/26/14  Yes [provider]  Potassium Chloride ER 20 MEQ TBCR Take 40 mEq by mouth 2 (two)  times daily. 11/15/20  Yes [provider]  haloperidol (HALDOL) 0.5 MG tablet Take 0.5 mg by mouth 2 (two) times daily. Patient not taking: Reported on 02/04/2021 02/03/21   [provider]  ondansetron (ZOFRAN ODT) 4 MG disintegrating tablet Take 1 tablet (4 mg total) by mouth every 8 (eight) hours as needed for nausea or vomiting. Patient not taking: Reported on 02/04/2021 04/05/20   Shaune Pollack, MD  oxyCODONE (ROXICODONE) 5 MG immediate release tablet Take 0.5 tablets (2.5 mg total) by mouth every 8 (eight) hours as needed for severe pain. Patient not taking: Reported on 02/04/2021 04/05/20 04/05/21  Shaune Pollack, MD  potassium chloride SA (K-DUR,KLOR-CON) 20 MEQ tablet Take 40  mEq by mouth 2 (two) times daily.  Patient not taking: Reported on 02/04/2021 03/05/14   [provider]    Current Facility-Administered Medications  Medication Dose Route Frequency Provider Last Rate Last Admin   0.9 %  sodium chloride infusion   Intravenous PRN Noralee Stain, DO 10 mL/hr at 02/09/21 0307 New Bag at 02/09/21 0307   acetaminophen (TYLENOL) tablet 650 mg  650 mg Oral Q6H PRN Andris Baumann, MD   650 mg at 02/09/21 0157   Or   acetaminophen (TYLENOL) suppository 650 mg  650 mg Rectal Q6H PRN Andris Baumann, MD       haloperidol (HALDOL) tablet 0.5 mg  0.5 mg Oral BID Noralee Stain, DO   0.5 mg at 02/09/21 6226   HYDROcodone-acetaminophen (NORCO/VICODIN) 5-325 MG per tablet 1-2 tablet  1-2 tablet Oral Q4H PRN Andris Baumann, MD       ipratropium-albuterol (DUONEB) 0.5-2.5 (3) MG/3ML nebulizer solution 3 mL  3 mL Nebulization Q4H PRN Noralee Stain, DO   3 mL at 02/08/21 1951   [START ON 02/10/2021] metoprolol succinate (TOPROL-XL) 24 hr tablet 25 mg  25 mg Oral Daily Noralee Stain, DO       ondansetron [facility]) tablet 4 mg  4 mg Oral Q6H PRN Andris Baumann, MD       Or   ondansetron [facility]) injection 4 mg  4 mg Intravenous Q6H PRN Andris Baumann, MD   4 mg at 02/05/21 1446   pantoprazole (PROTONIX) injection 40 mg  40 mg Intravenous Q12H Noralee Stain, DO        Allergies as of 02/03/2021 - Review Complete 02/03/2021  Allergen Reaction Noted   Neosporin [neomycin-bacitracin zn-polymyx] Itching and Swelling 04/08/2012   Keflex [cephalexin] Rash 04/08/2012     Review of Systems:     Review of Systems  Constitutional:  Positive for malaise/fatigue. Negative for chills and fever.  Respiratory:  Positive for shortness of breath.   Cardiovascular:  Negative for leg swelling.  Gastrointestinal:  Positive for blood in stool and diarrhea. Negative for vomiting.  Musculoskeletal:  Positive for falls.  Skin:  Negative for rash.  Neurological:  Positive for  dizziness and weakness.  Psychiatric/Behavioral:  Negative for substance abuse.   All other systems reviewed and are negative.     Physical Exam:  Vital signs in last 24 hours: Temp:  [97.6 F (36.4 C)-98.4 F (36.9 C)] 98.4 F (36.9 C) (01/16 1147) Pulse Rate:  [94-109] 105 (01/16 1147) Resp:  [16-20] 16 (01/16 1147) BP: (126-149)/(77-97) 149/97 (01/16 1147) SpO2:  [94 %] 94 % (01/16 1147) Last BM Date: 02/04/21 General:   Pleasant in NAD. Frail appearing Head:  Normocephalic and atraumatic. Eyes:   No icterus.   Conjunctiva pink. Mouth: Mucosa pink moist,  no lesions. Neck:  Supple; no masses felt Lungs:  No respiratory distress Heart:  RRR Abdomen:   Flat, soft, nondistended, nontender Msk:  No clubbing or cyanosis Neurologic:  Alert and  oriented x3 Skin:  Warm, dry, pink without significant lesions or rashes. Psych:  Alert and cooperative. Normal affect.  LAB RESULTS: Recent Labs    02/09/21 1114  WBC 10.4  HGB 12.4  HCT 37.1  PLT 256   BMET Recent Labs    02/07/21 0443 02/09/21 1114  NA 134* 134*  K 3.7 3.8  CL 101 101  CO2 26 26  GLUCOSE 93 106*  BUN 37* 31*  CREATININE 1.00 0.85  CALCIUM 7.9* 7.7*   LFT No results for input(s): PROT, ALBUMIN, AST, ALT, ALKPHOS, BILITOT, BILIDIR, IBILI in the last 72 hours. PT/INR Recent Labs    02/07/21 0443 02/09/21 1114  LABPROT 22.9* 29.9*  INR 2.0* 2.8*    STUDIES: DG Chest Port 1 View  Result Date: 02/09/2021 CLINICAL DATA:  Community-acquired pneumonia EXAM: PORTABLE CHEST 1 VIEW COMPARISON:  02/04/2021 FINDINGS: Cardiomegaly. Slight interval worsening of bilateral heterogeneous and interstitial airspace opacity, with probable new, small bilateral pleural effusions. The visualized skeletal structures are unremarkable. IMPRESSION: Slight interval worsening of bilateral heterogeneous and interstitial airspace opacity, with probable new, small bilateral pleural effusions. Findings are consistent with  worsened infection. Electronically Signed   By: Jearld Lesch M.D.   On: 02/09/2021 10:06       Impression / Plan:   88 y/o lady with multifocal pneumonia that required BIPAP who developed hematochezia in the hospital while on NOAC and INR of 2.8 with previously known diverticulosis. Differential includes diverticular bleed, hemorrhoidal, infection, vaginal etiologies, less likely malignancies. Regardless, given her age and comorbidities, would watch her for right now as a colonoscopy is likely more risky at this point than beneficial.  - recommend monitoring CBC's (anticipate a drop in hemoglobin tomorrow as she equilibrates) - hold NOAC for now - maintain active type and screen - if hemodynamically significant bleed then recommend stat CTA - maintain adequate IV access with two large bore IV's - consider GI infection stool studies if diarrhea persists  Will continue to follow, please call with any questions or concerns.  Merlyn Lot MD, MPH [facility]

## 2021-02-09 NOTE — Progress Notes (Signed)
OT Cancellation Note  Patient Details Name: Vanessa Logan MRN: [id number] DOB: Oct 09, 1926   Cancelled Treatment:    Reason Eval/Treat Not Completed: Patient politely declined, stating she felt too tired and SOB (O2 sats = 95%), requested the OT try tomorrow instead.  Latina Craver, PhD, MS, OTR/L 02/09/21, 3:55 PM

## 2021-02-10 ENCOUNTER — Inpatient Hospital Stay
Admit: 2021-02-10 | Discharge: 2021-02-10 | Disposition: A | Discharge status: Home / Self Care | Payer: Medicare Other | Attending: Internal Medicine | Admitting: Internal Medicine

## 2021-02-10 LAB — CBC
HCT: 34.9 % — ABNORMAL LOW (ref 36.0–46.0)
Hemoglobin: 11.6 g/dL — ABNORMAL LOW (ref 12.0–15.0)
MCH: 27.4 pg (ref 26.0–34.0)
MCHC: 33.2 g/dL (ref 30.0–36.0)
MCV: 82.3 fL (ref 80.0–100.0)
Platelets: 259 10*3/uL (ref 150–400)
RBC: 4.24 MIL/uL (ref 3.87–5.11)
RDW: 13.9 % (ref 11.5–15.5)
WBC: 10.2 10*3/uL (ref 4.0–10.5)
nRBC: 0 % (ref 0.0–0.2)

## 2021-02-10 LAB — ECHOCARDIOGRAM COMPLETE
AR max vel: 2.64 cm2
AV Area VTI: 2.54 cm2
AV Area mean vel: 2.24 cm2
AV Mean grad: 3 mmHg
AV Peak grad: 5.4 mmHg
Ao pk vel: 1.16 m/s
Area-P 1/2: 4.6 cm2
Height: 68 in
MV VTI: 2.01 cm2
S' Lateral: 2 cm
Weight: 2560 oz

## 2021-02-10 LAB — BASIC METABOLIC PANEL
Anion gap: 8 (ref 5–15)
BUN: 31 mg/dL — ABNORMAL HIGH (ref 8–23)
CO2: 30 mmol/L (ref 22–32)
Calcium: 7.5 mg/dL — ABNORMAL LOW (ref 8.9–10.3)
Chloride: 98 mmol/L (ref 98–111)
Creatinine, Ser: 0.89 mg/dL (ref 0.44–1.00)
GFR, Estimated: >60 mL/min (ref >60)
Glucose, Bld: 90 mg/dL (ref 70–99)
Potassium: 3.2 mmol/L — ABNORMAL LOW (ref 3.5–5.1)
Sodium: 136 mmol/L (ref 135–145)

## 2021-02-10 LAB — PROTIME-INR
INR: 2.1 — ABNORMAL HIGH (ref 0.8–1.2)
Prothrombin Time: 23.4 seconds — ABNORMAL HIGH (ref 11.4–15.2)

## 2021-02-10 LAB — MAGNESIUM: Magnesium: 1.7 mg/dL (ref 1.7–2.4)

## 2021-02-10 MED ORDER — ZINC OXIDE 40 % EX OINT
TOPICAL_OINTMENT | Freq: Four times a day (QID) | CUTANEOUS | Status: DC | PRN
  Filled 2021-02-10: qty 113

## 2021-02-10 MED ORDER — FUROSEMIDE 10 MG/ML IJ SOLN
40.0000 mg | Freq: Once | INTRAMUSCULAR | Status: AC
  Administered 2021-02-10: 40 mg via INTRAVENOUS
  Filled 2021-02-10: qty 4

## 2021-02-10 MED ORDER — POTASSIUM CHLORIDE CRYS ER 20 MEQ PO TBCR
40.0000 meq | EXTENDED_RELEASE_TABLET | ORAL | Status: AC
  Administered 2021-02-10 (×2): 40 meq via ORAL
  Filled 2021-02-10 (×2): qty 2

## 2021-02-10 NOTE — Progress Notes (Signed)
Occupational Therapy Treatment Patient Details Name: Vanessa Logan MRN: [id number] DOB: 05-27-1926 Today's Date: 02/10/2021   History of present illness Vanessa Logan is a 89yoF who [facility]21 after persisten right hip pain s/p fall off commode 2 days PTA. PMH: HTN, AF on eliquis, demention. hip pelvis imaging unrevealing of any fracture. Pt has been able to AMB and weight bear with RW. Workup revealing of PNA.   OT comments  Upon entering the room, pt supine in bed with no c/o pain and daughter in law present in room. Pt is agreeable to OT intervention but does report feeling very fatigued this afternoon. OT noticed pt's bed linen's appeared soiled and when pulled back pt had been incontinent of loose BM and was unaware. She had not called for assistance. Pt rolling L <> R with mod A and use of bed rails. Total A for hygiene and to change linen's. NT arrived for assistance for thoroughness and time management. Pt's skin is very red and she reports burning when cleaning. MD and RN notified. Pt repositioned in bed with clean gown and linens. Pt very fatigued and declines further intervention at this time in order to rest.    Recommendations for follow up therapy are one component of a multi-disciplinary discharge planning process, led by the attending physician.  Recommendations may be updated based on patient status, additional functional criteria and insurance authorization.    Follow Up Recommendations  Skilled nursing-short term rehab (<3 hours/day)    Assistance Recommended at Discharge Intermittent Supervision/Assistance  Patient can return home with the following  A lot of help with walking and/or transfers;A lot of help with bathing/dressing/bathroom;Help with stairs or ramp for entrance;Direct supervision/assist for medications management;Direct supervision/assist for financial management;Assistance with cooking/housework   Equipment Recommendations  BSC/3in1        Precautions / Restrictions Precautions Precautions: Fall       Mobility Bed Mobility Overal bed mobility: Needs Assistance Bed Mobility: Rolling Rolling: Mod assist         General bed mobility comments: pt reaches for bed rail and giving good effort but much weaker today    Transfers                   General transfer comment: deferred secondary to fatigue         ADL either performed or assessed with clinical judgement   ADL Overall ADL's : Needs assistance/impaired                                       General ADL Comments: total A for hygiene with mod A for rolling L <> R    Extremity/Trunk Assessment Upper Extremity Assessment Upper Extremity Assessment: Generalized weakness   Lower Extremity Assessment Lower Extremity Assessment: Generalized weakness        Vision Patient Visual Report: No change from baseline            Cognition Arousal/Alertness: Awake/alert Behavior During Therapy: WFL for tasks assessed/performed Overall Cognitive Status: Within Functional Limits for tasks assessed                                 General Comments: Pt is unaware of loose BM in room.She is pleasant overall.  Pertinent Vitals/ Pain       Pain Assessment Pain Assessment: 0-10 Pain Score: 10-Worst pain ever Pain Location: buttocks and peri area with hygiene Pain Descriptors / Indicators: Burning, Discomfort Pain Intervention(s): Repositioned, Other (comment) (notified MD and RN)         Frequency  Min 2X/week        Progress Toward Goals  OT Goals(current goals can now be found in the care plan section)  Progress towards OT goals: Progressing toward goals  Acute Rehab OT Goals Patient Stated Goal: to go home OT Goal Formulation: With patient/family Time For Goal Achievement: 02/20/21 Potential to Achieve Goals: Good  Plan Discharge plan remains appropriate;Frequency remains  appropriate       AM-PAC OT "6 Clicks" Daily Activity     Outcome Measure   Help from another person eating meals?: None Help from another person taking care of personal grooming?: A Little Help from another person toileting, which includes using toliet, bedpan, or urinal?: A Lot Help from another person bathing (including washing, rinsing, drying)?: A Lot Help from another person to put on and taking off regular upper body clothing?: A Little Help from another person to put on and taking off regular lower body clothing?: A Lot 6 Click Score: 16    End of Session    OT Visit Diagnosis: Unsteadiness on feet (R26.81)   Activity Tolerance Patient limited by fatigue   Patient Left in bed;with call bell/phone within reach;with bed alarm set;with family/visitor present   Nurse Communication Mobility status        Time: 1340-1408 OT Time Calculation (min): 28 min  Charges: OT General Charges $OT Visit: 1 Visit OT Treatments $Self Care/Home Management : 23-37 mins  Jackquline Denmark, MS, OTR/L , CBIS ascom [phone]  02/10/21, 3:48 PM

## 2021-02-10 NOTE — Care Management Important Message (Signed)
Important Message  Patient Details  Name: Vanessa Logan MRN: [id number] Date of Birth: 1926/05/06   Medicare Important Message Given:  Yes     Johnell Comings 02/10/2021, 11:36 AM

## 2021-02-10 NOTE — Progress Notes (Signed)
PT Cancellation Note  Patient Details Name: Vanessa Logan MRN: [id number] DOB: 03-Mar-1926   Cancelled Treatment:    Reason Eval/Treat Not Completed: Other (comment). Per OT, just finished session with patient, focused on pericare and bed linen change, ADLs as able. Pt reported very fatigued at end of OT session. PT to attempt tomorrow.   Olga Coaster PT, DPT 2:22 PM,02/10/21

## 2021-02-10 NOTE — Progress Notes (Signed)
PROGRESS NOTE    Odie SeraRuby O Assefa  WUJ:[id number] DOB: [birth year] DOA: 02/03/2021 PCP: Marguarite ArbourSparks, Jeffrey D, MD     Brief Narrative:  Odie SeraRuby O Burling is a 88 y.o. female with medical history significant for HTN, A. fib on Eliquis, dementia, who lives alone was brought to the ED initially for right hip pain following a fall off the commode 2 days prior.  She was able to get up and has been ambulating with her walker since.  States she hit her right hip and her head.  Patient denies being previously unwell prior to the fall.  Work-up has revealed multifocal pneumonia, as patient became increasingly tachypneic, she was started on BiPAP in the emergency department.  Work up further revealed E Coli bacteremia, likely GI source due to diarrhea reported prior to admission. She was treated with IV antibiotics with improvement. PT recommended SNF placement.  On 1/16, patient had multiple bloody bowel movements.  Eliquis was discontinued and GI was consulted.  New events last 24 hours / Subjective: Patient pleasantly demented, reports that she is feeling better today.  No further complaints.  Assessment & Plan:   Principal Problem:   Acute respiratory failure (HCC) Active Problems:   Fall at home, initial encounter   AKI (acute kidney injury) (HCC)   Metabolic acidosis   Hyperkalemia   Right hip pain   Rapid atrial fibrillation (HCC)   Multifocal pneumonia   Sepsis (HCC)   Severe sepsis (HCC)   Dementia without behavioral disturbance (HCC)   Severe sepsis secondary to E. coli bacteremia, multifocal pneumonia -Sepsis present on admission -CTA chest: Scattered reticulonodular opacities which appear infectious/inflammatory. No evidence of pulmonary embolism  -Completed antibiotic tx   Acute hypoxemic respiratory failure -Required BiPAP for short period of time, now weaned off and on room air -Repeat CXR was personally reviewed. Shows worsening aeration bilateral lung fields.  -Given IV Lasix 1/16,  1/17 -Echo pending   AKI -Baseline creatinine 0.9 -Hold HCTZ, cozaar  -Resolved  Paroxysmal A. fib RVR -Continue metoprolol -Eliquis on hold   Fall at home -Right hip xray: Osteopenia and degenerative change without evidence of fractures. -PT recommended SNF placement  Elevated INR -Patient is not on coumadin, has normal LFTs, ?etiology possibly eliquis  -Stable  Dementia -Takes Haldol, currently does live at home alone  Hyponatremia -Resolved  GI blood loss -Check FOBT. Hgb remains stable. Hold eliquis. Repeat INR  -GI consulted  Hypokalemia -Replace, trend   DVT prophylaxis: Eliquis on hold   Code Status: Full code Family Communication: Daughter updated over the phone  Disposition Plan:  Status is: Inpatient  Remains inpatient appropriate because: Giving another dose of IV Lasix today, echocardiogram is pending, GI to follow today    Antimicrobials:  Anti-infectives (From admission, onward)    Start     Dose/Rate Route Frequency Ordered Stop   02/06/21 0800  azithromycin (ZITHROMAX) 500 mg in sodium chloride 0.9 % 250 mL IVPB  Status:  Discontinued        500 mg 250 mL/hr over 60 Minutes Intravenous Every 24 hours 02/04/21 1013 02/09/21 1201   02/06/21 0800  cefTRIAXone (ROCEPHIN) 2 g in sodium chloride 0.9 % 100 mL IVPB  Status:  Discontinued        2 g 200 mL/hr over 30 Minutes Intravenous Every 24 hours 02/04/21 1013 02/04/21 1612   02/05/21 0400  cefTRIAXone (ROCEPHIN) 2 g in sodium chloride 0.9 % 100 mL IVPB        2  g 200 mL/hr over 30 Minutes Intravenous Every 24 hours 02/04/21 1612 02/09/21 0338   02/04/21 2300  azithromycin (ZITHROMAX) 500 mg in sodium chloride 0.9 % 250 mL IVPB  Status:  Discontinued        500 mg 250 mL/hr over 60 Minutes Intravenous Every 24 hours 02/04/21 0500 02/04/21 1013   02/04/21 2200  cefTRIAXone (ROCEPHIN) 1 g in sodium chloride 0.9 % 100 mL IVPB  Status:  Discontinued        1 g 200 mL/hr over 30 Minutes  Intravenous Every 24 hours 02/04/21 0500 02/04/21 1013   02/04/21 0430  levofloxacin (LEVAQUIN) IVPB 750 mg        750 mg 100 mL/hr over 90 Minutes Intravenous  Once 02/04/21 0422 02/04/21 0640        Objective: Vitals:   02/09/21 2352 02/10/21 0340 02/10/21 0753 02/10/21 1200  BP: (!) 137/103 115/74 122/78 119/89  Pulse: (!) 104 91 96 98  Resp: 18 20 19 18   Temp: 98.1 F (36.7 C) 98.4 F (36.9 C) 98 F (36.7 C) 98.2 F (36.8 C)  TempSrc:  Oral Oral   SpO2: 96% 94% 95% 98%  Weight:      Height:        Intake/Output Summary (Last 24 hours) at 02/10/2021 1226 Last data filed at 02/10/2021 1112 Gross per 24 hour  Intake 120 ml  Output 2700 ml  Net -2580 ml    Filed Weights   02/03/21 2318  Weight: 72.6 kg    Examination:  General exam: Appears calm and comfortable  Respiratory system: Diminished breath sounds, no distress, on room air  Cardiovascular system: S1 & S2 heard, irreg rhythm rate 103, +pedal edema  Gastrointestinal system: Abdomen is nondistended, soft and nontender. Normal bowel sounds heard. Central nervous system: Alert Extremities: Symmetric in appearance  Skin: No rashes, lesions or ulcers on exposed skin   Data Reviewed: I have personally reviewed following labs and imaging studies  CBC: Recent Labs  Lab 02/04/21 0440 02/05/21 0656 02/06/21 0631 02/09/21 1114 02/09/21 1647 02/10/21 0506  WBC 16.2* 11.4* 9.3 10.4  --  10.2  HGB 10.8* 10.8* 11.2* 12.4 13.6 11.6*  HCT 33.4* 31.9* 34.2* 37.1 41.0 34.9*  MCV 85.4 81.6 82.0 82.8  --  82.3  PLT 153 147* 158 256  --  259    Basic Metabolic Panel: Recent Labs  Lab 02/05/21 0656 02/06/21 0631 02/07/21 0443 02/09/21 1114 02/10/21 0506  NA 134* 135 134* 134* 136  K 2.9* 4.0 3.7 3.8 3.2*  CL 102 100 101 101 98  CO2 23 25 26 26 30   GLUCOSE 89 97 93 106* 90  BUN 50* 43* 37* 31* 31*  CREATININE 1.34* 1.26* 1.00 0.85 0.89  CALCIUM 7.7* 7.9* 7.9* 7.7* 7.5*  MG  --  2.0  --   --  1.7     GFR: Estimated Creatinine Clearance: 40.7 mL/min (by C-G formula based on SCr of 0.89 mg/dL). Liver Function Tests: Recent Labs  Lab 02/03/21 2334 02/04/21 0440  AST 24 25  ALT 20 19  ALKPHOS 107 106  BILITOT 1.4* 1.2  PROT 6.6 5.7*  ALBUMIN 2.7* 2.4*    Recent Labs  Lab 02/03/21 2334  LIPASE 34    No results for input(s): AMMONIA in the last 168 hours. Coagulation Profile: Recent Labs  Lab 02/06/21 0631 02/07/21 0443 02/09/21 1114 02/09/21 1647 02/10/21 0506  INR 2.0* 2.0* 2.8* 2.3* 2.1*    Cardiac Enzymes: No results  for input(s): CKTOTAL, CKMB, CKMBINDEX, TROPONINI in the last 168 hours. BNP (last 3 results) No results for input(s): PROBNP in the last 8760 hours. HbA1C: No results for input(s): HGBA1C in the last 72 hours. CBG: No results for input(s): GLUCAP in the last 168 hours. Lipid Profile: No results for input(s): CHOL, HDL, LDLCALC, TRIG, CHOLHDL, LDLDIRECT in the last 72 hours. Thyroid Function Tests: No results for input(s): TSH, T4TOTAL, FREET4, T3FREE, THYROIDAB in the last 72 hours.  Anemia Panel: No results for input(s): VITAMINB12, FOLATE, FERRITIN, TIBC, IRON, RETICCTPCT in the last 72 hours. Sepsis Labs: Recent Labs  Lab 02/04/21 0440 02/04/21 0710 02/04/21 1257  PROCALCITON 8.71  --   --   LATICACIDVEN 3.9* 4.1* 1.8     Recent Results (from the past 240 hour(s))  Resp Panel by RT-PCR (Flu A&B, Covid) Nasopharyngeal Swab     Status: None   Collection Time: 02/03/21 11:34 PM   Specimen: Nasopharyngeal Swab; Nasopharyngeal(NP) swabs in vial transport medium  Result Value Ref Range Status   SARS Coronavirus 2 by RT PCR NEGATIVE NEGATIVE Final    Comment: (NOTE) SARS-CoV-2 target nucleic acids are NOT DETECTED.  The SARS-CoV-2 RNA is generally detectable in upper respiratory specimens during the acute phase of infection. The lowest concentration of SARS-CoV-2 viral copies this assay can detect is 138 copies/mL. A negative  result does not preclude SARS-Cov-2 infection and should not be used as the sole basis for treatment or other patient management decisions. A negative result may occur with  improper specimen collection/handling, submission of specimen other than nasopharyngeal swab, presence of viral mutation(s) within the areas targeted by this assay, and inadequate number of viral copies(<138 copies/mL). A negative result must be combined with clinical observations, patient history, and epidemiological information. The expected result is Negative.  Fact Sheet for Patients:  BloggerCourse.comhttps://www.fda.gov/media/152166/download  Fact Sheet for Healthcare Providers:  SeriousBroker.ithttps://www.fda.gov/media/152162/download  This test is no t yet approved or cleared by the [place] FDA and  has been authorized for detection and/or diagnosis of SARS-CoV-2 by FDA under an Emergency Use Authorization (EUA). This EUA will remain  in effect (meaning this test can be used) for the duration of the COVID-19 declaration under Section 564(b)(1) of the Act, 21 U.S.C.section 360bbb-3(b)(1), unless the authorization is terminated  or revoked sooner.       Influenza A by PCR NEGATIVE NEGATIVE Final   Influenza B by PCR NEGATIVE NEGATIVE Final    Comment: (NOTE) The Xpert Xpress SARS-CoV-2/FLU/RSV plus assay is intended as an aid in the diagnosis of influenza from Nasopharyngeal swab specimens and should not be used as a sole basis for treatment. Nasal washings and aspirates are unacceptable for Xpert Xpress SARS-CoV-2/FLU/RSV testing.  Fact Sheet for Patients: BloggerCourse.comhttps://www.fda.gov/media/152166/download  Fact Sheet for Healthcare Providers: SeriousBroker.ithttps://www.fda.gov/media/152162/download  This test is not yet approved or cleared by the [place] FDA and has been authorized for detection and/or diagnosis of SARS-CoV-2 by FDA under an Emergency Use Authorization (EUA). This EUA will remain in effect (meaning this test can be used)  for the duration of the COVID-19 declaration under Section 564(b)(1) of the Act, 21 U.S.C. section 360bbb-3(b)(1), unless the authorization is terminated or revoked.  Performed at [facility], [address] [place], [place] [id number]   Culture, blood (single)     Status: Abnormal   Collection Time: 02/04/21  4:40 AM   Specimen: BLOOD  Result Value Ref Range Status   Specimen Description   Final    BLOOD  RIGHT ANTECUBITAL Performed at [facility], [address], [place], [place] [address]    Special Requests   Final    BOTTLES DRAWN AEROBIC AND ANAEROBIC Blood Culture adequate volume Performed at [facility], [address] [place], [place] [address]    Culture  Setup Time   Final    GRAM NEGATIVE RODS IN BOTH AEROBIC AND ANAEROBIC BOTTLES CRITICAL RESULT CALLED TO, READ BACK BY AND VERIFIED WITH: LISA KLUTTZ AT 1543 ON 02/04/21 BY SS Performed at [facility], [address], [place], [place] [address]    Culture ESCHERICHIA COLI (A)  Final   Report Status 02/06/2021 FINAL  Final   Organism ID, Bacteria ESCHERICHIA COLI  Final      Susceptibility   Escherichia coli - MIC*    AMPICILLIN >=32 RESISTANT Resistant     CEFAZOLIN <=4 SENSITIVE Sensitive     CEFEPIME <=0.12 SENSITIVE Sensitive     CEFTAZIDIME <=1 SENSITIVE Sensitive     CEFTRIAXONE <=0.25 SENSITIVE Sensitive     CIPROFLOXACIN <=0.25 SENSITIVE Sensitive     GENTAMICIN <=1 SENSITIVE Sensitive     IMIPENEM <=0.25 SENSITIVE Sensitive     TRIMETH/SULFA >=320 RESISTANT Resistant     AMPICILLIN/SULBACTAM 16 INTERMEDIATE Intermediate     PIP/TAZO <=4 SENSITIVE Sensitive     * ESCHERICHIA COLI  Blood Culture ID Panel (Reflexed)     Status: Abnormal   Collection Time: 02/04/21  4:40 AM  Result Value Ref Range Status   Enterococcus faecalis NOT DETECTED NOT DETECTED Final   Enterococcus Faecium NOT DETECTED NOT DETECTED Final   Listeria monocytogenes NOT DETECTED NOT DETECTED  Final   Staphylococcus species NOT DETECTED NOT DETECTED Final   Staphylococcus aureus (BCID) NOT DETECTED NOT DETECTED Final   Staphylococcus epidermidis NOT DETECTED NOT DETECTED Final   Staphylococcus lugdunensis NOT DETECTED NOT DETECTED Final   Streptococcus species NOT DETECTED NOT DETECTED Final   Streptococcus agalactiae NOT DETECTED NOT DETECTED Final   Streptococcus pneumoniae NOT DETECTED NOT DETECTED Final   Streptococcus pyogenes NOT DETECTED NOT DETECTED Final   A.calcoaceticus-baumannii NOT DETECTED NOT DETECTED Final   Bacteroides fragilis NOT DETECTED NOT DETECTED Final   Enterobacterales DETECTED (A) NOT DETECTED Final    Comment: Enterobacterales represent a large order of gram negative bacteria, not a single organism. CRITICAL RESULT CALLED TO, READ BACK BY AND VERIFIED WITH: LISA KLUTTZ AT 1543 ON 02/04/21 BY SS    Enterobacter cloacae complex NOT DETECTED NOT DETECTED Final   Escherichia coli DETECTED (A) NOT DETECTED Final    Comment: CRITICAL RESULT CALLED TO, READ BACK BY AND VERIFIED WITH: LISA KLUTTZ AT 1543 ON 02/04/21 BY SS    Klebsiella aerogenes NOT DETECTED NOT DETECTED Final   Klebsiella oxytoca NOT DETECTED NOT DETECTED Final   Klebsiella pneumoniae NOT DETECTED NOT DETECTED Final   Proteus species NOT DETECTED NOT DETECTED Final   Salmonella species NOT DETECTED NOT DETECTED Final   Serratia marcescens NOT DETECTED NOT DETECTED Final   Haemophilus influenzae NOT DETECTED NOT DETECTED Final   Neisseria meningitidis NOT DETECTED NOT DETECTED Final   Pseudomonas aeruginosa NOT DETECTED NOT DETECTED Final   Stenotrophomonas maltophilia NOT DETECTED NOT DETECTED Final   Candida albicans NOT DETECTED NOT DETECTED Final   Candida auris NOT DETECTED NOT DETECTED Final   Candida glabrata NOT DETECTED NOT DETECTED Final   Candida krusei NOT DETECTED NOT DETECTED Final   Candida parapsilosis NOT DETECTED NOT DETECTED Final   Candida tropicalis  NOT DETECTED  NOT DETECTED Final   Cryptococcus neoformans/gattii NOT DETECTED NOT DETECTED Final   CTX-M ESBL NOT DETECTED NOT DETECTED Final   Carbapenem resistance IMP NOT DETECTED NOT DETECTED Final   Carbapenem resistance KPC NOT DETECTED NOT DETECTED Final   Carbapenem resistance NDM NOT DETECTED NOT DETECTED Final   Carbapenem resist OXA 48 LIKE NOT DETECTED NOT DETECTED Final   Carbapenem resistance VIM NOT DETECTED NOT DETECTED Final    Comment: Performed at [facility], [address] [place], [place] [address]  Urine Culture     Status: None   Collection Time: 02/04/21  6:14 PM   Specimen: Urine, Random  Result Value Ref Range Status   Specimen Description   Final    URINE, RANDOM Performed at [facility], [address], [place], [place] [address]    Special Requests   Final    NONE Performed at [facility], [address] [place], [place] [address]    Culture   Final    NO GROWTH Performed at [facility], [address] [place], [place] [address]    Report Status 02/05/2021 FINAL  Final       Radiology Studies: DG Chest Port 1 View  Result Date: 02/09/2021 CLINICAL DATA:  Community-acquired pneumonia EXAM: PORTABLE CHEST 1 VIEW COMPARISON:  02/04/2021 FINDINGS: Cardiomegaly. Slight interval worsening of bilateral heterogeneous and interstitial airspace opacity, with probable new, small bilateral pleural effusions. The visualized skeletal structures are unremarkable. IMPRESSION: Slight interval worsening of bilateral heterogeneous and interstitial airspace opacity, with probable new, small bilateral pleural effusions. Findings are consistent with worsened infection. Electronically Signed   By: Jearld Lesch M.D.   On: 02/09/2021 10:06      Scheduled Meds:  haloperidol  0.5 mg Oral BID   metoprolol succinate  25 mg Oral Daily   pantoprazole (PROTONIX) IV  40 mg Intravenous Q12H   potassium chloride  40 mEq Oral Q4H   Continuous  Infusions:  sodium chloride 10 mL/hr at 02/09/21 0307     LOS: 6 days     Noralee Stain, DO [facility] 02/10/2021, 12:26 PM   Available via Epic secure chat 7am-7pm After these hours, please refer to coverage provider listed on [email or web address]

## 2021-02-10 NOTE — Progress Notes (Signed)
*  PRELIMINARY RESULTS* Echocardiogram 2D Echocardiogram has been performed.  Cristela Blue 02/10/2021, 9:37 AM

## 2021-02-10 NOTE — TOC Progression Note (Signed)
Transition of Care [facility]) - Progression Note    Patient Details  Name: Vanessa Logan MRN: [id number] Date of Birth: 10-03-1926  Transition of Care [facility]) CM/SW Contact  Gildardo Griffes, [place] Phone Number: 02/10/2021, 8:59 AM  Clinical Narrative:     CSW notes that patient not medically ready to dc to [facility] today due to GI bleed.   TOC will continue to follow for medical readiness. Verlon Au with [employer] has been updated.    Expected Discharge Plan: Skilled Nursing Facility Barriers to Discharge: Continued Medical Work up  Expected Discharge Plan and Services Expected Discharge Plan: Skilled Nursing Facility       Living arrangements for the past 2 months: Single Family Home                                       Social Determinants of Health (SDOH) Interventions    Readmission Risk Interventions No flowsheet data found.

## 2021-02-10 NOTE — Progress Notes (Signed)
GI Inpatient Follow-up Note  Subjective:  Patient seen and endorses shortness of breath. Per nursing, only flecks of old blood seen.  Scheduled Inpatient Medications:   haloperidol  0.5 mg Oral BID   metoprolol succinate  25 mg Oral Daily   pantoprazole (PROTONIX) IV  40 mg Intravenous Q12H    Continuous Inpatient Infusions:    sodium chloride 10 mL/hr at 02/09/21 0307    PRN Inpatient Medications:  sodium chloride, acetaminophen **OR** acetaminophen, HYDROcodone-acetaminophen, ipratropium-albuterol, liver oil-zinc oxide, ondansetron **OR** ondansetron (ZOFRAN) IV  Review of Systems:  Review of Systems  Constitutional:  Negative for chills and fever.  Respiratory:  Positive for shortness of breath.   Cardiovascular:  Negative for chest pain.  Gastrointestinal:  Positive for diarrhea. Negative for abdominal pain and constipation.  Skin:  Negative for rash.  Neurological:  Positive for weakness.  Psychiatric/Behavioral:  Negative for substance abuse.   All other systems reviewed and are negative.    Physical Examination: BP 119/89 (BP Location: Left Arm)    Pulse 98    Temp 98.2 F (36.8 C)    Resp 18    Ht 5\' 8"  (1.727 m)    Wt 72.6 kg    SpO2 98%    BMI 24.33 kg/m  Gen: NAD, alert and oriented x 3 HEENT: PEERLA, EOMI, Neck: supple, no JVD or thyromegaly Chest:  CV: RRR, no m/g/c/r Abd: soft, NT, Non-distended Ext: no edema, well perfused with 2+ pulses, Skin: no rash or lesions noted Lymph: no LAD  Data: Lab Results  Component Value Date   WBC 10.2 02/10/2021   HGB 11.6 (L) 02/10/2021   HCT 34.9 (L) 02/10/2021   MCV 82.3 02/10/2021   PLT 259 02/10/2021   Recent Labs  Lab 02/09/21 1114 02/09/21 1647 02/10/21 0506  HGB 12.4 13.6 11.6*   Lab Results  Component Value Date   NA 136 02/10/2021   K 3.2 (L) 02/10/2021   CL 98 02/10/2021   CO2 30 02/10/2021   BUN 31 (H) 02/10/2021   CREATININE 0.89 02/10/2021   Lab Results  Component Value Date   ALT 19  02/04/2021   AST 25 02/04/2021   ALKPHOS 106 02/04/2021   BILITOT 1.2 02/04/2021   Recent Labs  Lab 02/10/21 0506  INR 2.1*   Assessment/Plan: Vanessa Logan is a 88 y.o. lady with multifocal pneumonia that required BIPAP who developed hematochezia in the hospital while on NOAC and INR of 2.8 with previously known diverticulosis. Differential includes diverticular bleed, hemorrhoidal, infection, vaginal etiologies, less likely malignancies. Continue to monitor for now given minimal blood seen and stable hemoglobin  Recommendations:  - daily CBC's - hold NOAC, will need to weigh risk/benefit of restarting - maintain active type and screen - if hemodynamically significant bleed then recommend stat CTA - maintain adequate IV access with two large bore IV's - consider GI infection stool studies if diarrhea persists   Will continue to follow, please call with any questions or concerns.  99 MD, MPH [facility]

## 2021-02-10 NOTE — Plan of Care (Signed)

## 2021-02-10 NOTE — Progress Notes (Addendum)
Mobility Specialist - Progress Note   02/10/21 1500  Mobility  Activity Refused mobility     Pt declined mobility, no reason specified. Pt reports feeling SOB and wanting to rest prior to rectal tube placement. O2 91% on RA at time of entry. Will attempt session another date/time.    Kathee Delton Mobility Specialist 02/10/21, 4:33 PM

## 2021-02-11 ENCOUNTER — Inpatient Hospital Stay: Payer: Medicare Other

## 2021-02-11 ENCOUNTER — Encounter: Payer: Self-pay | Admitting: Internal Medicine

## 2021-02-11 DIAGNOSIS — J189 Pneumonia, unspecified organism: Secondary | ICD-10-CM

## 2021-02-11 DIAGNOSIS — F0390 Unspecified dementia without behavioral disturbance: Secondary | ICD-10-CM

## 2021-02-11 LAB — CBC
HCT: 36.9 % (ref 36.0–46.0)
Hemoglobin: 11.9 g/dL — ABNORMAL LOW (ref 12.0–15.0)
MCH: 27.1 pg (ref 26.0–34.0)
MCHC: 32.2 g/dL (ref 30.0–36.0)
MCV: 84.1 fL (ref 80.0–100.0)
Platelets: 275 10*3/uL (ref 150–400)
RBC: 4.39 MIL/uL (ref 3.87–5.11)
RDW: 13.9 % (ref 11.5–15.5)
WBC: 9.4 10*3/uL (ref 4.0–10.5)
nRBC: 0 % (ref 0.0–0.2)

## 2021-02-11 LAB — BASIC METABOLIC PANEL
Anion gap: 6 (ref 5–15)
BUN: 33 mg/dL — ABNORMAL HIGH (ref 8–23)
CO2: 31 mmol/L (ref 22–32)
Calcium: 7.5 mg/dL — ABNORMAL LOW (ref 8.9–10.3)
Chloride: 97 mmol/L — ABNORMAL LOW (ref 98–111)
Creatinine, Ser: 0.9 mg/dL (ref 0.44–1.00)
GFR, Estimated: 60 mL/min — ABNORMAL LOW (ref >60)
Glucose, Bld: 88 mg/dL (ref 70–99)
Potassium: 3.6 mmol/L (ref 3.5–5.1)
Sodium: 134 mmol/L — ABNORMAL LOW (ref 135–145)

## 2021-02-11 LAB — MAGNESIUM: Magnesium: 1.8 mg/dL (ref 1.7–2.4)

## 2021-02-11 LAB — PROTIME-INR
INR: 1.5 — ABNORMAL HIGH (ref 0.8–1.2)
Prothrombin Time: 18.4 seconds — ABNORMAL HIGH (ref 11.4–15.2)

## 2021-02-11 MED ORDER — FUROSEMIDE 10 MG/ML IJ SOLN
40.0000 mg | Freq: Once | INTRAMUSCULAR | Status: AC
  Administered 2021-02-11: 40 mg via INTRAVENOUS
  Filled 2021-02-11: qty 4

## 2021-02-11 MED ORDER — POTASSIUM CHLORIDE CRYS ER 20 MEQ PO TBCR
40.0000 meq | EXTENDED_RELEASE_TABLET | Freq: Once | ORAL | Status: AC
  Administered 2021-02-11: 40 meq via ORAL
  Filled 2021-02-11: qty 2

## 2021-02-11 NOTE — Progress Notes (Addendum)
Physical Therapy Treatment Patient Details Name: Vanessa Logan MRN: [id number] DOB: 10-Jun-1926 Today's Date: 02/11/2021   History of Present Illness Anam Bobby is a 89yoF who comes to [facility] on 02/03/21 after persisten right hip pain s/p fall off commode 2 days PTA. PMH: HTN, AF on eliquis, demention. hip pelvis imaging unrevealing of any fracture. Pt has been able to AMB and weight bear with RW. Workup revealing of PNA.    PT Comments    Pt resting in bed and woke to verbal greeting from PT. Pt denies any pain, but reports some nausea. Pt was able to perform bed mobility w/ CGA, reliance on bed rails, and minA for trunk elevation. She was able to perform sit to stand w/ maxA and RW, once standing she required increased verbal cues to decrease posterior lean, increase WB through UE, and extended knees/trunk for more upright standing posture. Once standing, Pt was able to perform some side-shuffling steps (~25ft) with verbal cueing from PT about picking up feet to move and continuing to maintain upright posture. Pt was then seated back in bed due need for urine clean up before transferring to recliner w/ maxA+2. Pt will benefit from continued skilled PT in order to improve LE strength, mobility, gait, and restore PLOF. Current discharge recommendation remains appropriate due to the level of assistance required by patient to ensure safety and improve overall function.   Recommendations for follow up therapy are one component of a multi-disciplinary discharge planning process, led by the attending physician.  Recommendations may be updated based on patient status, additional functional criteria and insurance authorization.  Follow Up Recommendations  Skilled nursing-short term rehab (<3 hours/day)     Assistance Recommended at Discharge Intermittent Supervision/Assistance  Patient can return home with the following Two people to help with walking and/or transfers;Two people to help with  bathing/dressing/bathroom;Assistance with cooking/housework;Assistance with feeding;Assist for transportation   Equipment Recommendations  None recommended by PT    Recommendations for Other Services       Precautions / Restrictions Precautions Precautions: Fall Restrictions Weight Bearing Restrictions: No     Mobility  Bed Mobility Overal bed mobility: Needs Assistance Bed Mobility: Supine to Sit Rolling: Min assist   Supine to sit: Min assist, HOB elevated (for trunk elevation)     General bed mobility comments: pt reaches for bed rail; requires increased time, but gives good effor and shows willingness to move.    Transfers Overall transfer level: Needs assistance Equipment used: Rolling walker (2 wheels), 2 person hand held assist Transfers: Sit to/from Stand, Bed to chair/wheelchair/BSC Sit to Stand: Max assist (posterior lean, and reliance on verbal cues for UE utilization) Stand pivot transfers: +2 physical assistance, Mod assist              Ambulation/Gait Ambulation/Gait assistance: Max assist Gait Distance (Feet): 1 Feet Assistive device: Rolling walker (2 wheels)   Gait velocity: decreased     General Gait Details: slow-side shuffling pattern, required verbal cues about picking feet up to side step.   Stairs             Wheelchair Mobility    Modified Rankin (Stroke Patients Only)       Balance Overall balance assessment: Needs assistance Sitting-balance support: Bilateral upper extremity supported, Feet supported Sitting balance-Leahy Scale: Good   Postural control: Posterior lean (flexed trunk and increased knee flexion) Standing balance support: Bilateral upper extremity supported, Reliant on assistive device for balance Standing balance-Leahy Scale: Poor Standing balance comment:  see postural control section                            Cognition Arousal/Alertness: Awake/alert Behavior During Therapy: WFL for  tasks assessed/performed Overall Cognitive Status: Within Functional Limits for tasks assessed                                          Exercises      General Comments        Pertinent Vitals/Pain Pain Assessment Pain Assessment: No/denies pain    Home Living                          Prior Function            PT Goals (current goals can now be found in the care plan section) Progress towards PT goals: Progressing toward goals    Frequency    Min 2X/week      PT Plan Current plan remains appropriate    Co-evaluation              AM-PAC PT "6 Clicks" Mobility   Outcome Measure  Help needed turning from your back to your side while in a flat bed without using bedrails?: A Little Help needed moving from lying on your back to sitting on the side of a flat bed without using bedrails?: A Lot Help needed moving to and from a bed to a chair (including a wheelchair)?: A Lot Help needed standing up from a chair using your arms (e.g., wheelchair or bedside chair)?: A Lot Help needed to walk in hospital room?: A Lot Help needed climbing 3-5 steps with a railing? : Total 6 Click Score: 12    End of Session Equipment Utilized During Treatment: Gait belt Activity Tolerance: Patient tolerated treatment well Patient left: in chair;with chair alarm set;with call bell/phone within reach Nurse Communication: Mobility status PT Visit Diagnosis: Unsteadiness on feet (R26.81);Difficulty in walking, not elsewhere classified (R26.2);Muscle weakness (generalized) (M62.81);Dizziness and giddiness (R42);Other symptoms and signs involving the nervous system (R29.898)     Time: 1002-1040 PT Time Calculation (min) (ACUTE ONLY): 38 min  Charges:  $Therapeutic Activity: 38-52 mins                      Jeralyn Bennett, SPT 02/11/2021, 12:44 PM

## 2021-02-11 NOTE — Progress Notes (Signed)
GI Inpatient Follow-up Note  Subjective:  Patient seen and no bleeding. Hemoglobin stable. Still   Scheduled Inpatient Medications:   furosemide  40 mg Intravenous Once   haloperidol  0.5 mg Oral BID   metoprolol succinate  25 mg Oral Daily   pantoprazole (PROTONIX) IV  40 mg Intravenous Q12H   potassium chloride  40 mEq Oral Once    Continuous Inpatient Infusions:    sodium chloride 10 mL/hr at 02/09/21 0307    PRN Inpatient Medications:  sodium chloride, acetaminophen **OR** acetaminophen, HYDROcodone-acetaminophen, ipratropium-albuterol, liver oil-zinc oxide, ondansetron **OR** ondansetron (ZOFRAN) IV  Review of Systems:  Review of Systems  Constitutional:  Negative for chills and fever.  Respiratory:  Positive for shortness of breath.   Cardiovascular:  Negative for chest pain.  Gastrointestinal:  Negative for abdominal pain and blood in stool.  Musculoskeletal:  Positive for joint pain.  Skin:  Negative for itching and rash.  Neurological:  Negative for focal weakness.  Psychiatric/Behavioral:  Negative for substance abuse.   All other systems reviewed and are negative.    Physical Examination: BP 112/78 (BP Location: Right Arm)    Pulse 83    Temp 97.8 F (36.6 C)    Resp 18    Ht 5\' 8"  (1.727 m)    Wt 72.6 kg    SpO2 100%    BMI 24.33 kg/m  Gen: NAD, alert and oriented x 4 HEENT: PEERLA, EOMI, Neck: supple, no JVD or thyromegaly Chest: somewhat short of breath when talking Abd: soft, NT, non-distended. Brown stool in rectal tube bag Ext: trace edema Skin: no rash or lesions noted Lymph: no LAD  Data: Lab Results  Component Value Date   WBC 9.4 02/11/2021   HGB 11.9 (L) 02/11/2021   HCT 36.9 02/11/2021   MCV 84.1 02/11/2021   PLT 275 02/11/2021   Recent Labs  Lab 02/09/21 1647 02/10/21 0506 02/11/21 0712  HGB 13.6 11.6* 11.9*   Lab Results  Component Value Date   NA 134 (L) 02/11/2021   K 3.6 02/11/2021   CL 97 (L) 02/11/2021   CO2 31  02/11/2021   BUN 33 (H) 02/11/2021   CREATININE 0.90 02/11/2021   Lab Results  Component Value Date   ALT 19 02/04/2021   AST 25 02/04/2021   ALKPHOS 106 02/04/2021   BILITOT 1.2 02/04/2021   Recent Labs  Lab 02/11/21 0712  INR 1.5*   Assessment/Plan: Ms. Schmude is a 88 y.o. lady with multifocal pneumonia that required BIPAP who developed hematochezia in the hospital while on NOAC and INR of 2.8 with previously known diverticulosis. Differential includes diverticular bleed, hemorrhoidal, infection, vaginal etiologies, less likely malignancies. No further overt GI bleeding  Recommendations:  - daily CBC's - will need to weigh risks/benefits of restarting NOAC in lady with history of frequent falls - suspect elevation of INR on NOAC is combination of poor oral intake and NOAC - risk of colonoscopy at this time outweigh benefits given severe pneumonia and active shortness of breath, age, deconditioned status, and no further bleeding   We will sign-off. Please call with any questions or concerns.   Raylene Miyamoto MD, MPH [place]

## 2021-02-11 NOTE — Progress Notes (Signed)
Progress Note    Vanessa Logan  SWF:[id number] DOB: April 20, 1926  DOA: 02/03/2021 PCP: Marguarite Arbour, MD      Brief Narrative:    Medical records reviewed and are as summarized below:  Vanessa Logan is a 88 y.o. female with medical history significant for hypertension, atrial fibrillation on Eliquis, dementia, who was brought to the hospital because of right hip pain after falling off a commode about 2 days prior to admission.  She was found to have severe sepsis secondary to multifocal pneumonia and E. coli bacteremia complicated by acute hypoxemic respiratory failure and AKI.  She also had atrial fibrillation with RVR.    Assessment/Plan:   Principal Problem:   Acute respiratory failure (HCC) Active Problems:   Fall at home, initial encounter   AKI (acute kidney injury) (HCC)   Metabolic acidosis   Hyperkalemia   Right hip pain   Rapid atrial fibrillation (HCC)   Multifocal pneumonia   Sepsis (HCC)   Severe sepsis (HCC)   Dementia without behavioral disturbance (HCC)    Body mass index is 24.33 kg/m.   Severe sepsis secondary to E. coli bacteremia and multifocal pneumonia: Completed antibiotics.  Acute hypoxemic respiratory failure: Improved.  Initially required BiPAP.  She is tolerating room air.  Paroxysmal atrial fibrillation: Continue metoprolol and Eliquis  Acute GI bleeding, hematochezia: Resolved.  H&H is stable.  Gastroenterologist has signed off.  Acute pulmonary edema, likely acute on chronic diastolic CHF, fluid overload, small left pleural effusion: Repeat dose of IV Lasix today.  2D echo showed EF estimated at 60 to 65%, normal LV diastolic parameters mildly dilated left and right atria.  Hyponatremia: Overall, sodium level is improved from 125-134.  Hypokalemia: Continue potassium repletion.  S/p fall, generalized weakness: PT and OT recommend discharge to SNF.  Dementia: continue supportive care   Diet Order             Diet Heart  Room service appropriate? Yes; Fluid consistency: Thin  Diet effective now                      Consultants: Gastroenterologist  Procedures: None    Medications:    furosemide  40 mg Intravenous Once   haloperidol  0.5 mg Oral BID   metoprolol succinate  25 mg Oral Daily   pantoprazole (PROTONIX) IV  40 mg Intravenous Q12H   potassium chloride  40 mEq Oral Once   Continuous Infusions:  sodium chloride 10 mL/hr at 02/09/21 0307     Anti-infectives (From admission, onward)    Start     Dose/Rate Route Frequency Ordered Stop   02/06/21 0800  azithromycin (ZITHROMAX) 500 mg in sodium chloride 0.9 % 250 mL IVPB  Status:  Discontinued        500 mg 250 mL/hr over 60 Minutes Intravenous Every 24 hours 02/04/21 1013 02/09/21 1201   02/06/21 0800  cefTRIAXone (ROCEPHIN) 2 g in sodium chloride 0.9 % 100 mL IVPB  Status:  Discontinued        2 g 200 mL/hr over 30 Minutes Intravenous Every 24 hours 02/04/21 1013 02/04/21 1612   02/05/21 0400  cefTRIAXone (ROCEPHIN) 2 g in sodium chloride 0.9 % 100 mL IVPB        2 g 200 mL/hr over 30 Minutes Intravenous Every 24 hours 02/04/21 1612 02/09/21 0338   02/04/21 2300  azithromycin (ZITHROMAX) 500 mg in sodium chloride 0.9 % 250 mL IVPB  Status:  Discontinued        500 mg 250 mL/hr over 60 Minutes Intravenous Every 24 hours 02/04/21 0500 02/04/21 1013   02/04/21 2200  cefTRIAXone (ROCEPHIN) 1 g in sodium chloride 0.9 % 100 mL IVPB  Status:  Discontinued        1 g 200 mL/hr over 30 Minutes Intravenous Every 24 hours 02/04/21 0500 02/04/21 1013   02/04/21 0430  levofloxacin (LEVAQUIN) IVPB 750 mg        750 mg 100 mL/hr over 90 Minutes Intravenous  Once 02/04/21 0422 02/04/21 0640              Family Communication/Anticipated D/C date and plan/Code Status   DVT prophylaxis:      Code Status: Full Code  Family Communication: None Disposition Plan: Plan to discharge to SNF tomorrow   Status is:  Inpatient  Remains inpatient appropriate because: Fluid overload           Subjective:   Interval events noted.  No chest pain or shortness of breath  Objective:    Vitals:   02/10/21 2357 02/11/21 0540 02/11/21 0737 02/11/21 1136  BP: 133/88 (!) 147/88 120/75 112/78  Pulse: 90 78 88 83  Resp: 18 18 16 18   Temp: 97.7 F (36.5 C) 98 F (36.7 C) 98.9 F (37.2 C) 97.8 F (36.6 C)  TempSrc: Oral     SpO2: 97% 96% 98% 100%  Weight:      Height:       No data found.   Intake/Output Summary (Last 24 hours) at 02/11/2021 1515 Last data filed at 02/11/2021 1300 Gross per 24 hour  Intake 600 ml  Output 1800 ml  Net -1200 ml   Filed Weights   02/03/21 2318  Weight: 72.6 kg    Exam:  GEN: NAD SKIN: Warm and dry EYES: EOMI ENT: MMM CV: RRR PULM:  Bibasilar rales, no wheezing ABD: soft, ND, NT, +BS, +rectal tube in place CNS: AAO x 3, non focal EXT: No edema or tenderness        Data Reviewed:   I have personally reviewed following labs and imaging studies:  Labs: Labs show the following:   Basic Metabolic Panel: Recent Labs  Lab 02/06/21 0631 02/07/21 0443 02/09/21 1114 02/10/21 0506 02/11/21 0712  NA 135 134* 134* 136 134*  K 4.0 3.7 3.8 3.2* 3.6  CL 100 101 101 98 97*  CO2 25 26 26 30 31   GLUCOSE 97 93 106* 90 88  BUN 43* 37* 31* 31* 33*  CREATININE 1.26* 1.00 0.85 0.89 0.90  CALCIUM 7.9* 7.9* 7.7* 7.5* 7.5*  MG 2.0  --   --  1.7 1.8   GFR Estimated Creatinine Clearance: 40.2 mL/min (by C-G formula based on SCr of 0.9 mg/dL). Liver Function Tests: No results for input(s): AST, ALT, ALKPHOS, BILITOT, PROT, ALBUMIN in the last 168 hours. No results for input(s): LIPASE, AMYLASE in the last 168 hours. No results for input(s): AMMONIA in the last 168 hours. Coagulation profile Recent Labs  Lab 02/07/21 0443 02/09/21 1114 02/09/21 1647 02/10/21 0506 02/11/21 0712  INR 2.0* 2.8* 2.3* 2.1* 1.5*    CBC: Recent Labs  Lab  02/05/21 0656 02/06/21 0631 02/09/21 1114 02/09/21 1647 02/10/21 0506 02/11/21 0712  WBC 11.4* 9.3 10.4  --  10.2 9.4  HGB 10.8* 11.2* 12.4 13.6 11.6* 11.9*  HCT 31.9* 34.2* 37.1 41.0 34.9* 36.9  MCV 81.6 82.0 82.8  --  82.3 84.1  PLT 147* 158  256  --  259 275   Cardiac Enzymes: No results for input(s): CKTOTAL, CKMB, CKMBINDEX, TROPONINI in the last 168 hours. BNP (last 3 results) No results for input(s): PROBNP in the last 8760 hours. CBG: No results for input(s): GLUCAP in the last 168 hours. D-Dimer: No results for input(s): DDIMER in the last 72 hours. Hgb A1c: No results for input(s): HGBA1C in the last 72 hours. Lipid Profile: No results for input(s): CHOL, HDL, LDLCALC, TRIG, CHOLHDL, LDLDIRECT in the last 72 hours. Thyroid function studies: No results for input(s): TSH, T4TOTAL, T3FREE, THYROIDAB in the last 72 hours.  Invalid input(s): FREET3 Anemia work up: No results for input(s): VITAMINB12, FOLATE, FERRITIN, TIBC, IRON, RETICCTPCT in the last 72 hours. Sepsis Labs: Recent Labs  Lab 02/06/21 0631 02/09/21 1114 02/10/21 0506 02/11/21 0712  WBC 9.3 10.4 10.2 9.4    Microbiology Recent Results (from the past 240 hour(s))  Resp Panel by RT-PCR (Flu A&B, Covid) Nasopharyngeal Swab     Status: None   Collection Time: 02/03/21 11:34 PM   Specimen: Nasopharyngeal Swab; Nasopharyngeal(NP) swabs in vial transport medium  Result Value Ref Range Status   SARS Coronavirus 2 by RT PCR NEGATIVE NEGATIVE Final    Comment: (NOTE) SARS-CoV-2 target nucleic acids are NOT DETECTED.  The SARS-CoV-2 RNA is generally detectable in upper respiratory specimens during the acute phase of infection. The lowest concentration of SARS-CoV-2 viral copies this assay can detect is 138 copies/mL. A negative result does not preclude SARS-Cov-2 infection and should not be used as the sole basis for treatment or other patient management decisions. A negative result may occur with   improper specimen collection/handling, submission of specimen other than nasopharyngeal swab, presence of viral mutation(s) within the areas targeted by this assay, and inadequate number of viral copies(<138 copies/mL). A negative result must be combined with clinical observations, patient history, and epidemiological information. The expected result is Negative.  Fact Sheet for Patients:  BloggerCourse.comhttps://www.fda.gov/media/152166/download  Fact Sheet for Healthcare Providers:  SeriousBroker.ithttps://www.fda.gov/media/152162/download  This test is no t yet approved or cleared by the [place] FDA and  has been authorized for detection and/or diagnosis of SARS-CoV-2 by FDA under an Emergency Use Authorization (EUA). This EUA will remain  in effect (meaning this test can be used) for the duration of the COVID-19 declaration under Section 564(b)(1) of the Act, 21 U.S.C.section 360bbb-3(b)(1), unless the authorization is terminated  or revoked sooner.       Influenza A by PCR NEGATIVE NEGATIVE Final   Influenza B by PCR NEGATIVE NEGATIVE Final    Comment: (NOTE) The Xpert Xpress SARS-CoV-2/FLU/RSV plus assay is intended as an aid in the diagnosis of influenza from Nasopharyngeal swab specimens and should not be used as a sole basis for treatment. Nasal washings and aspirates are unacceptable for Xpert Xpress SARS-CoV-2/FLU/RSV testing.  Fact Sheet for Patients: BloggerCourse.comhttps://www.fda.gov/media/152166/download  Fact Sheet for Healthcare Providers: SeriousBroker.ithttps://www.fda.gov/media/152162/download  This test is not yet approved or cleared by the [place] FDA and has been authorized for detection and/or diagnosis of SARS-CoV-2 by FDA under an Emergency Use Authorization (EUA). This EUA will remain in effect (meaning this test can be used) for the duration of the COVID-19 declaration under Section 564(b)(1) of the Act, 21 U.S.C. section 360bbb-3(b)(1), unless the authorization is terminated  or revoked.  Performed at [facility], [address] [place], [place] [address]   Culture, blood (single)     Status: Abnormal   Collection Time: 02/04/21  4:40 AM  Specimen: BLOOD  Result Value Ref Range Status   Specimen Description   Final    BLOOD RIGHT ANTECUBITAL Performed at [facility], [address] [place], [place] [address]    Special Requests   Final    BOTTLES DRAWN AEROBIC AND ANAEROBIC Blood Culture adequate volume Performed at [facility], [address] [place], [place] [address]    Culture  Setup Time   Final    GRAM NEGATIVE RODS IN BOTH AEROBIC AND ANAEROBIC BOTTLES CRITICAL RESULT CALLED TO, READ BACK BY AND VERIFIED WITH: LISA KLUTTZ AT 1543 ON 02/04/21 BY SS Performed at [facility], [address] [place], [place] [address]    Culture ESCHERICHIA COLI (A)  Final   Report Status 02/06/2021 FINAL  Final   Organism ID, Bacteria ESCHERICHIA COLI  Final      Susceptibility   Escherichia coli - MIC*    AMPICILLIN >=32 RESISTANT Resistant     CEFAZOLIN <=4 SENSITIVE Sensitive     CEFEPIME <=0.12 SENSITIVE Sensitive     CEFTAZIDIME <=1 SENSITIVE Sensitive     CEFTRIAXONE <=0.25 SENSITIVE Sensitive     CIPROFLOXACIN <=0.25 SENSITIVE Sensitive     GENTAMICIN <=1 SENSITIVE Sensitive     IMIPENEM <=0.25 SENSITIVE Sensitive     TRIMETH/SULFA >=320 RESISTANT Resistant     AMPICILLIN/SULBACTAM 16 INTERMEDIATE Intermediate     PIP/TAZO <=4 SENSITIVE Sensitive     * ESCHERICHIA COLI  Blood Culture ID Panel (Reflexed)     Status: Abnormal   Collection Time: 02/04/21  4:40 AM  Result Value Ref Range Status   Enterococcus faecalis NOT DETECTED NOT DETECTED Final   Enterococcus Faecium NOT DETECTED NOT DETECTED Final   Listeria monocytogenes NOT DETECTED NOT DETECTED Final   Staphylococcus species NOT DETECTED NOT DETECTED Final   Staphylococcus aureus (BCID) NOT DETECTED NOT DETECTED Final   Staphylococcus epidermidis  NOT DETECTED NOT DETECTED Final   Staphylococcus lugdunensis NOT DETECTED NOT DETECTED Final   Streptococcus species NOT DETECTED NOT DETECTED Final   Streptococcus agalactiae NOT DETECTED NOT DETECTED Final   Streptococcus pneumoniae NOT DETECTED NOT DETECTED Final   Streptococcus pyogenes NOT DETECTED NOT DETECTED Final   A.calcoaceticus-baumannii NOT DETECTED NOT DETECTED Final   Bacteroides fragilis NOT DETECTED NOT DETECTED Final   Enterobacterales DETECTED (A) NOT DETECTED Final    Comment: Enterobacterales represent a large order of gram negative bacteria, not a single organism. CRITICAL RESULT CALLED TO, READ BACK BY AND VERIFIED WITH: LISA KLUTTZ AT 1543 ON 02/04/21 BY SS    Enterobacter cloacae complex NOT DETECTED NOT DETECTED Final   Escherichia coli DETECTED (A) NOT DETECTED Final    Comment: CRITICAL RESULT CALLED TO, READ BACK BY AND VERIFIED WITH: LISA KLUTTZ AT 1543 ON 02/04/21 BY SS    Klebsiella aerogenes NOT DETECTED NOT DETECTED Final   Klebsiella oxytoca NOT DETECTED NOT DETECTED Final   Klebsiella pneumoniae NOT DETECTED NOT DETECTED Final   Proteus species NOT DETECTED NOT DETECTED Final   Salmonella species NOT DETECTED NOT DETECTED Final   Serratia marcescens NOT DETECTED NOT DETECTED Final   Haemophilus influenzae NOT DETECTED NOT DETECTED Final   Neisseria meningitidis NOT DETECTED NOT DETECTED Final   Pseudomonas aeruginosa NOT DETECTED NOT DETECTED Final   Stenotrophomonas maltophilia NOT DETECTED NOT DETECTED Final   Candida albicans NOT DETECTED NOT DETECTED Final   Candida auris NOT DETECTED NOT DETECTED Final   Candida glabrata NOT DETECTED NOT DETECTED Final   Candida  krusei NOT DETECTED NOT DETECTED Final   Candida parapsilosis NOT DETECTED NOT DETECTED Final   Candida tropicalis NOT DETECTED NOT DETECTED Final   Cryptococcus neoformans/gattii NOT DETECTED NOT DETECTED Final   CTX-M ESBL NOT DETECTED NOT DETECTED Final   Carbapenem resistance IMP  NOT DETECTED NOT DETECTED Final   Carbapenem resistance KPC NOT DETECTED NOT DETECTED Final   Carbapenem resistance NDM NOT DETECTED NOT DETECTED Final   Carbapenem resist OXA 48 LIKE NOT DETECTED NOT DETECTED Final   Carbapenem resistance VIM NOT DETECTED NOT DETECTED Final    Comment: Performed at [facility], [address] [place], [place] [address]  Urine Culture     Status: None   Collection Time: 02/04/21  6:14 PM   Specimen: Urine, Random  Result Value Ref Range Status   Specimen Description   Final    URINE, RANDOM Performed at [facility], [address] [place], [place] [address]    Special Requests   Final    NONE Performed at [facility], [address] [place], [place] [address]    Culture   Final    NO GROWTH Performed at [facility], [address], [place], [place] [address]    Report Status 02/05/2021 FINAL  Final    Procedures and diagnostic studies:  DG Chest Port 1 View  Result Date: 02/11/2021 CLINICAL DATA:  Shortness of breath EXAM: PORTABLE CHEST 1 VIEW COMPARISON:  Previous studies including the examination of 02/09/2021 FINDINGS: Transverse diameter of heart is increased. Central pulmonary vessels are less prominent. There is improvement in aeration of parahilar regions and lower lung fields. There is blunting of left lateral CP angle. There is increased density in the left lower lung field obscuring the left hemidiaphragm. There is no pneumothorax. IMPRESSION: Cardiomegaly. There is interval decrease in pulmonary vascular congestion. There is interval improvement in aeration of both parahilar regions and lower lung fields suggesting resolving pulmonary edema. Increased density in the left lower lung fields may be due to pleural effusion and possibly underlying atelectasis/pneumonia. Electronically Signed   By: Ernie Avena M.D.   On: 02/11/2021 13:32   ECHOCARDIOGRAM COMPLETE  Result Date: 02/10/2021     ECHOCARDIOGRAM REPORT   Patient Name:   Vanessa Logan Date of Exam: 02/10/2021 Medical Rec #:  [id number]    Height:       68.0 in Accession #:    [id number]   Weight:       160.0 lb Date of Birth:  April 05, 1926    BSA:          1.859 m Patient Age:    89 years     BP:           122/78 mmHg Patient Gender: F            HR:           96 bpm. Exam Location:  [facility] Procedure: 2D Echo, Cardiac Doppler and Color Doppler Indications:     CHF-acute systolic I50.21  History:         Patient has no prior history of Echocardiogram examinations.                  Arrythmias:Atrial Fibrillation; Risk Factors:Hypertension.  Sonographer:     Cristela Blue Referring Phys:  [id number] JENNIFER CHOI Diagnosing Phys: Arnoldo Hooker MD  Sonographer Comments: Suboptimal apical window and no subcostal window. Image acquisition challenging due to patient body habitus. IMPRESSIONS  1.  Left ventricular ejection fraction, by estimation, is 60 to 65%. The left ventricle has normal function. The left ventricle has no regional wall motion abnormalities. Left ventricular diastolic parameters were normal.  2. Right ventricular systolic function is normal. The right ventricular size is mildly enlarged.  3. Left atrial size was mildly dilated.  4. Right atrial size was mildly dilated.  5. Moderate pleural effusion.  6. The mitral valve is normal in structure. Mild mitral valve regurgitation.  7. The aortic valve is normal in structure. Aortic valve regurgitation is not visualized. FINDINGS  Left Ventricle: Left ventricular ejection fraction, by estimation, is 60 to 65%. The left ventricle has normal function. The left ventricle has no regional wall motion abnormalities. The left ventricular internal cavity size was normal in size. There is  no left ventricular hypertrophy. Left ventricular diastolic parameters were normal. Right Ventricle: The right ventricular size is mildly enlarged. No increase in right ventricular wall thickness. Right ventricular systolic  function is normal. Left Atrium: Left atrial size was mildly dilated. Right Atrium: Right atrial size was mildly dilated. Pericardium: There is no evidence of pericardial effusion. Mitral Valve: The mitral valve is normal in structure. Mild mitral valve regurgitation. MV peak gradient, 6.2 mmHg. The mean mitral valve gradient is 3.0 mmHg. Tricuspid Valve: The tricuspid valve is normal in structure. Tricuspid valve regurgitation is not demonstrated. Aortic Valve: The aortic valve is normal in structure. Aortic valve regurgitation is not visualized. Aortic valve mean gradient measures 3.0 mmHg. Aortic valve peak gradient measures 5.4 mmHg. Aortic valve area, by VTI measures 2.54 cm. Pulmonic Valve: The pulmonic valve was normal in structure. Pulmonic valve regurgitation is not visualized. Aorta: The aortic root and ascending aorta are structurally normal, with no evidence of dilitation. IAS/Shunts: No atrial level shunt detected by color flow Doppler. Additional Comments: There is a moderate pleural effusion.  LEFT VENTRICLE PLAX 2D LVIDd:         3.20 cm LVIDs:         2.00 cm LV PW:         1.20 cm LV IVS:        0.85 cm LVOT diam:     2.00 cm LV SV:         45 LV SV Index:   24 LVOT Area:     3.14 cm  RIGHT VENTRICLE RV Basal diam:  4.30 cm RV S prime:     12.60 cm/s TAPSE (M-mode): 3.0 cm LEFT ATRIUM           Index        RIGHT ATRIUM           Index LA diam:      3.40 cm 1.83 cm/m   RA Area:     25.30 cm LA Vol (A2C): 25.0 ml 13.45 ml/m  RA Volume:   79.20 ml  42.61 ml/m LA Vol (A4C): 35.6 ml 19.15 ml/m  AORTIC VALVE                    PULMONIC VALVE AV Area (Vmax):    2.64 cm     PV Vmax:        0.47 m/s AV Area (Vmean):   2.24 cm     PV Vmean:       31.300 cm/s AV Area (VTI):     2.54 cm     PV VTI:         0.085 m AV Vmax:  116.00 cm/s  PV Peak grad:   0.9 mmHg AV Vmean:          81.600 cm/s  PV Mean grad:   0.0 mmHg AV VTI:            0.178 m      RVOT Peak grad: 2 mmHg AV Peak Grad:       5.4 mmHg AV Mean Grad:      3.0 mmHg LVOT Vmax:         97.30 cm/s LVOT Vmean:        58.300 cm/s LVOT VTI:          0.144 m LVOT/AV VTI ratio: 0.81  AORTA Ao Root diam: 3.10 cm MITRAL VALVE                TRICUSPID VALVE MV Area (PHT): 4.60 cm     TR Peak grad:   26.4 mmHg MV Area VTI:   2.01 cm     TR Vmax:        257.00 cm/s MV Peak grad:  6.2 mmHg MV Mean grad:  3.0 mmHg     SHUNTS MV Vmax:       1.25 m/s     Systemic VTI:  0.14 m MV Vmean:      77.4 cm/s    Systemic Diam: 2.00 cm MV Decel Time: 165 msec     Pulmonic VTI:  0.081 m MV E velocity: 102.00 cm/s Arnoldo Hooker MD Electronically signed by Arnoldo Hooker MD Signature Date/Time: 02/10/2021/1:21:32 PM    Final                LOS: 7 days   Myleen Brailsford  [facility]   Pager on [email or web address]. If 7PM-7AM, please contact night-coverage at [email or web address]     02/11/2021, 3:15 PM

## 2021-02-12 DIAGNOSIS — I4891 Unspecified atrial fibrillation: Secondary | ICD-10-CM

## 2021-02-12 DIAGNOSIS — N179 Acute kidney failure, unspecified: Secondary | ICD-10-CM

## 2021-02-12 DIAGNOSIS — R7881 Bacteremia: Secondary | ICD-10-CM

## 2021-02-12 DIAGNOSIS — B9620 Unspecified Escherichia coli [E. coli] as the cause of diseases classified elsewhere: Secondary | ICD-10-CM

## 2021-02-12 LAB — RESP PANEL BY RT-PCR (FLU A&B, COVID) ARPGX2
Influenza A by PCR: NEGATIVE
Influenza B by PCR: NEGATIVE
SARS Coronavirus 2 by RT PCR: NEGATIVE

## 2021-02-12 LAB — CBC WITH DIFFERENTIAL/PLATELET
Abs Immature Granulocytes: 0.16 10*3/uL — ABNORMAL HIGH (ref 0.00–0.07)
Basophils Absolute: 0 10*3/uL (ref 0.0–0.1)
Basophils Relative: 0 %
Eosinophils Absolute: 0.1 10*3/uL (ref 0.0–0.5)
Eosinophils Relative: 1 %
HCT: 38.3 % (ref 36.0–46.0)
Hemoglobin: 12.4 g/dL (ref 12.0–15.0)
Immature Granulocytes: 1 %
Lymphocytes Relative: 12 %
Lymphs Abs: 1.5 10*3/uL (ref 0.7–4.0)
MCH: 27.5 pg (ref 26.0–34.0)
MCHC: 32.4 g/dL (ref 30.0–36.0)
MCV: 84.9 fL (ref 80.0–100.0)
Monocytes Absolute: 0.8 10*3/uL (ref 0.1–1.0)
Monocytes Relative: 7 %
Neutro Abs: 9.4 10*3/uL — ABNORMAL HIGH (ref 1.7–7.7)
Neutrophils Relative %: 79 %
Platelets: 339 10*3/uL (ref 150–400)
RBC: 4.51 MIL/uL (ref 3.87–5.11)
RDW: 14 % (ref 11.5–15.5)
WBC: 11.8 10*3/uL — ABNORMAL HIGH (ref 4.0–10.5)
nRBC: 0 % (ref 0.0–0.2)

## 2021-02-12 LAB — BASIC METABOLIC PANEL
Anion gap: 8 (ref 5–15)
BUN: 33 mg/dL — ABNORMAL HIGH (ref 8–23)
CO2: 31 mmol/L (ref 22–32)
Calcium: 7.7 mg/dL — ABNORMAL LOW (ref 8.9–10.3)
Chloride: 97 mmol/L — ABNORMAL LOW (ref 98–111)
Creatinine, Ser: 1.07 mg/dL — ABNORMAL HIGH (ref 0.44–1.00)
GFR, Estimated: 49 mL/min — ABNORMAL LOW (ref >60)
Glucose, Bld: 84 mg/dL (ref 70–99)
Potassium: 4.4 mmol/L (ref 3.5–5.1)
Sodium: 136 mmol/L (ref 135–145)

## 2021-02-12 MED ORDER — PANTOPRAZOLE SODIUM 40 MG PO TBEC: 40.0000 mg | DELAYED_RELEASE_TABLET | Freq: Every day | ORAL | Status: DC

## 2021-02-12 NOTE — Discharge Summary (Addendum)
Physician Discharge Summary  Vanessa Logan MPN:[id number] DOB: Nov 01, 1926 DOA: 02/03/2021  PCP: Marguarite Arbour, MD  Admit date: 02/03/2021 Discharge date: 02/12/2021  Discharge disposition: SNF   Recommendations for Outpatient Follow-Up:   Follow-up with physician at the nursing home within 3 days of discharge   Discharge Diagnosis:   Principal Problem:   Acute respiratory failure (HCC) Active Problems:   Fall at home, initial encounter   AKI (acute kidney injury) (HCC)   Metabolic acidosis   Hyperkalemia   Right hip pain   Rapid atrial fibrillation (HCC)   Multifocal pneumonia   Sepsis (HCC)   Severe sepsis (HCC)   Dementia without behavioral disturbance (HCC)   E coli bacteremia    Discharge Condition: Stable.  Diet recommendation:  Diet Order             Diet - low sodium heart healthy           Diet Heart Room service appropriate? Yes; Fluid consistency: Thin  Diet effective now                     Code Status: Full Code     Hospital Course:   Vanessa Logan is a 88 y.o. female with medical history significant for hypertension, atrial fibrillation on Eliquis, dementia, who was brought to the hospital because of right hip pain after falling off a commode about 2 days prior to admission.   She was found to have severe sepsis secondary to multifocal pneumonia and E. coli bacteremia complicated by acute hypoxemic respiratory failure and AKI.  She also had atrial fibrillation with RVR.  She was treated with empiric IV antibiotics and IV fluids.  She developed fluid overload which required IV Lasix.  She also developed acute GI bleeding/hematochezia.  She was treated with IV Protonix.  Gastroenterologist was consulted to assist with management.  Endoscopic evaluation was deferred because of resolution of bleeding, advanced age, stable H&H and multiple comorbidities.  She had hypokalemia that was repleted.  She was evaluated by PT and OT recommended further  rehabilitation at SNF.  Her condition has improved and she is deemed stable for discharge to SNF today.  Discharge plan was discussed with Ms. Wells Guiles, daughter.  Continue treatment with Eliquis was discussed in the context of recent GI bleed.  Ms. Joesphine Bare said she prefers outpatient continue with Eliquis for now because she is concerned about increased risk of stroke without Eliquis.  She also understands that Eliquis is associated with increased risk of bleeding.   Medical Consultants:   Gastroenterologist   Discharge Exam:    Vitals:   02/11/21 2055 02/11/21 2351 02/12/21 0421 02/12/21 0826  BP: 115/70 115/75 128/70 119/86  Pulse: 81 (!) 53 74 78  Resp: 16 17 16 18   Temp: (!) 97.5 F (36.4 C) (!) 97.5 F (36.4 C) 97.7 F (36.5 C) 98 F (36.7 C)  TempSrc:  Oral    SpO2: 95% 93% 90% 92%  Weight:      Height:         GEN: NAD SKIN: Warm and dry EYES: No pallor or icterus ENT: MMM CV: RRR PULM: CTA B ABD: soft, ND, NT, +BS CNS: AAO x 3, non focal EXT: No edema or tenderness     The results of significant diagnostics from this hospitalization (including imaging, microbiology, ancillary and laboratory) are listed below for reference.     Procedures and Diagnostic Studies:   DG Chest Port 1  View  Result Date: 02/11/2021 CLINICAL DATA:  Shortness of breath EXAM: PORTABLE CHEST 1 VIEW COMPARISON:  Previous studies including the examination of 02/09/2021 FINDINGS: Transverse diameter of heart is increased. Central pulmonary vessels are less prominent. There is improvement in aeration of parahilar regions and lower lung fields. There is blunting of left lateral CP angle. There is increased density in the left lower lung field obscuring the left hemidiaphragm. There is no pneumothorax. IMPRESSION: Cardiomegaly. There is interval decrease in pulmonary vascular congestion. There is interval improvement in aeration of both parahilar regions and lower lung fields  suggesting resolving pulmonary edema. Increased density in the left lower lung fields may be due to pleural effusion and possibly underlying atelectasis/pneumonia. Electronically Signed   By: Ernie Avena M.D.   On: 02/11/2021 13:32   ECHOCARDIOGRAM COMPLETE  Result Date: 02/10/2021    ECHOCARDIOGRAM REPORT   Patient Name:   DENEA Logan Date of Exam: 02/10/2021 Medical Rec #:  [id number]    Height:       68.0 in Accession #:    [id number]   Weight:       160.0 lb Date of Birth:  Sep 12, 1926    BSA:          1.859 m Patient Age:    89 years     BP:           122/78 mmHg Patient Gender: F            HR:           96 bpm. Exam Location:  [facility] Procedure: 2D Echo, Cardiac Doppler and Color Doppler Indications:     CHF-acute systolic I50.21  History:         Patient has no prior history of Echocardiogram examinations.                  Arrythmias:Atrial Fibrillation; Risk Factors:Hypertension.  Sonographer:     Cristela Blue Referring Phys:  [id number] JENNIFER CHOI Diagnosing Phys: Arnoldo Hooker MD  Sonographer Comments: Suboptimal apical window and no subcostal window. Image acquisition challenging due to patient body habitus. IMPRESSIONS  1. Left ventricular ejection fraction, by estimation, is 60 to 65%. The left ventricle has normal function. The left ventricle has no regional wall motion abnormalities. Left ventricular diastolic parameters were normal.  2. Right ventricular systolic function is normal. The right ventricular size is mildly enlarged.  3. Left atrial size was mildly dilated.  4. Right atrial size was mildly dilated.  5. Moderate pleural effusion.  6. The mitral valve is normal in structure. Mild mitral valve regurgitation.  7. The aortic valve is normal in structure. Aortic valve regurgitation is not visualized. FINDINGS  Left Ventricle: Left ventricular ejection fraction, by estimation, is 60 to 65%. The left ventricle has normal function. The left ventricle has no regional wall motion  abnormalities. The left ventricular internal cavity size was normal in size. There is  no left ventricular hypertrophy. Left ventricular diastolic parameters were normal. Right Ventricle: The right ventricular size is mildly enlarged. No increase in right ventricular wall thickness. Right ventricular systolic function is normal. Left Atrium: Left atrial size was mildly dilated. Right Atrium: Right atrial size was mildly dilated. Pericardium: There is no evidence of pericardial effusion. Mitral Valve: The mitral valve is normal in structure. Mild mitral valve regurgitation. MV peak gradient, 6.2 mmHg. The mean mitral valve gradient is 3.0 mmHg. Tricuspid Valve: The tricuspid valve is normal in structure. Tricuspid valve regurgitation is  not demonstrated. Aortic Valve: The aortic valve is normal in structure. Aortic valve regurgitation is not visualized. Aortic valve mean gradient measures 3.0 mmHg. Aortic valve peak gradient measures 5.4 mmHg. Aortic valve area, by VTI measures 2.54 cm. Pulmonic Valve: The pulmonic valve was normal in structure. Pulmonic valve regurgitation is not visualized. Aorta: The aortic root and ascending aorta are structurally normal, with no evidence of dilitation. IAS/Shunts: No atrial level shunt detected by color flow Doppler. Additional Comments: There is a moderate pleural effusion.  LEFT VENTRICLE PLAX 2D LVIDd:         3.20 cm LVIDs:         2.00 cm LV PW:         1.20 cm LV IVS:        0.85 cm LVOT diam:     2.00 cm LV SV:         45 LV SV Index:   24 LVOT Area:     3.14 cm  RIGHT VENTRICLE RV Basal diam:  4.30 cm RV S prime:     12.60 cm/s TAPSE (M-mode): 3.0 cm LEFT ATRIUM           Index        RIGHT ATRIUM           Index LA diam:      3.40 cm 1.83 cm/m   RA Area:     25.30 cm LA Vol (A2C): 25.0 ml 13.45 ml/m  RA Volume:   79.20 ml  42.61 ml/m LA Vol (A4C): 35.6 ml 19.15 ml/m  AORTIC VALVE                    PULMONIC VALVE AV Area (Vmax):    2.64 cm     PV Vmax:         0.47 m/s AV Area (Vmean):   2.24 cm     PV Vmean:       31.300 cm/s AV Area (VTI):     2.54 cm     PV VTI:         0.085 m AV Vmax:           116.00 cm/s  PV Peak grad:   0.9 mmHg AV Vmean:          81.600 cm/s  PV Mean grad:   0.0 mmHg AV VTI:            0.178 m      RVOT Peak grad: 2 mmHg AV Peak Grad:      5.4 mmHg AV Mean Grad:      3.0 mmHg LVOT Vmax:         97.30 cm/s LVOT Vmean:        58.300 cm/s LVOT VTI:          0.144 m LVOT/AV VTI ratio: 0.81  AORTA Ao Root diam: 3.10 cm MITRAL VALVE                TRICUSPID VALVE MV Area (PHT): 4.60 cm     TR Peak grad:   26.4 mmHg MV Area VTI:   2.01 cm     TR Vmax:        257.00 cm/s MV Peak grad:  6.2 mmHg MV Mean grad:  3.0 mmHg     SHUNTS MV Vmax:       1.25 m/s     Systemic VTI:  0.14 m MV Vmean:      77.4  cm/s    Systemic Diam: 2.00 cm MV Decel Time: 165 msec     Pulmonic VTI:  0.081 m MV E velocity: 102.00 cm/s Arnoldo HookerBruce Kowalski MD Electronically signed by Arnoldo HookerBruce Kowalski MD Signature Date/Time: 02/10/2021/1:21:32 PM    Final      Labs:   Basic Metabolic Panel: Recent Labs  Lab 02/06/21 0631 02/07/21 0443 02/09/21 1114 02/10/21 0506 02/11/21 0712 02/12/21 0631  NA 135 134* 134* 136 134* 136  K 4.0 3.7 3.8 3.2* 3.6 4.4  CL 100 101 101 98 97* 97*  CO2 25 26 26 30 31 31   GLUCOSE 97 93 106* 90 88 84  BUN 43* 37* 31* 31* 33* 33*  CREATININE 1.26* 1.00 0.85 0.89 0.90 1.07*  CALCIUM 7.9* 7.9* 7.7* 7.5* 7.5* 7.7*  MG 2.0  --   --  1.7 1.8  --    GFR Estimated Creatinine Clearance: 33.8 mL/min (A) (by C-G formula based on SCr of 1.07 mg/dL (H)). Liver Function Tests: No results for input(s): AST, ALT, ALKPHOS, BILITOT, PROT, ALBUMIN in the last 168 hours. No results for input(s): LIPASE, AMYLASE in the last 168 hours. No results for input(s): AMMONIA in the last 168 hours. Coagulation profile Recent Labs  Lab 02/07/21 0443 02/09/21 1114 02/09/21 1647 02/10/21 0506 02/11/21 0712  INR 2.0* 2.8* 2.3* 2.1* 1.5*    CBC: Recent Labs   Lab 02/06/21 0631 02/09/21 1114 02/09/21 1647 02/10/21 0506 02/11/21 0712 02/12/21 0631  WBC 9.3 10.4  --  10.2 9.4 11.8*  NEUTROABS  --   --   --   --   --  9.4*  HGB 11.2* 12.4 13.6 11.6* 11.9* 12.4  HCT 34.2* 37.1 41.0 34.9* 36.9 38.3  MCV 82.0 82.8  --  82.3 84.1 84.9  PLT 158 256  --  259 275 339   Cardiac Enzymes: No results for input(s): CKTOTAL, CKMB, CKMBINDEX, TROPONINI in the last 168 hours. BNP: Invalid input(s): POCBNP CBG: No results for input(s): GLUCAP in the last 168 hours. D-Dimer No results for input(s): DDIMER in the last 72 hours. Hgb A1c No results for input(s): HGBA1C in the last 72 hours. Lipid Profile No results for input(s): CHOL, HDL, LDLCALC, TRIG, CHOLHDL, LDLDIRECT in the last 72 hours. Thyroid function studies No results for input(s): TSH, T4TOTAL, T3FREE, THYROIDAB in the last 72 hours.  Invalid input(s): FREET3 Anemia work up No results for input(s): VITAMINB12, FOLATE, FERRITIN, TIBC, IRON, RETICCTPCT in the last 72 hours. Microbiology Recent Results (from the past 240 hour(s))  Resp Panel by RT-PCR (Flu A&B, Covid) Nasopharyngeal Swab     Status: None   Collection Time: 02/03/21 11:34 PM   Specimen: Nasopharyngeal Swab; Nasopharyngeal(NP) swabs in vial transport medium  Result Value Ref Range Status   SARS Coronavirus 2 by RT PCR NEGATIVE NEGATIVE Final    Comment: (NOTE) SARS-CoV-2 target nucleic acids are NOT DETECTED.  The SARS-CoV-2 RNA is generally detectable in upper respiratory specimens during the acute phase of infection. The lowest concentration of SARS-CoV-2 viral copies this assay can detect is 138 copies/mL. A negative result does not preclude SARS-Cov-2 infection and should not be used as the sole basis for treatment or other patient management decisions. A negative result may occur with  improper specimen collection/handling, submission of specimen other than nasopharyngeal swab, presence of viral mutation(s) within  the areas targeted by this assay, and inadequate number of viral copies(<138 copies/mL). A negative result must be combined with clinical observations, patient history, and epidemiological  information. The expected result is Negative.  Fact Sheet for Patients:  BloggerCourse.com  Fact Sheet for Healthcare Providers:  SeriousBroker.it  This test is no t yet approved or cleared by the [place] FDA and  has been authorized for detection and/or diagnosis of SARS-CoV-2 by FDA under an Emergency Use Authorization (EUA). This EUA will remain  in effect (meaning this test can be used) for the duration of the COVID-19 declaration under Section 564(b)(1) of the Act, 21 U.S.C.section 360bbb-3(b)(1), unless the authorization is terminated  or revoked sooner.       Influenza A by PCR NEGATIVE NEGATIVE Final   Influenza B by PCR NEGATIVE NEGATIVE Final    Comment: (NOTE) The Xpert Xpress SARS-CoV-2/FLU/RSV plus assay is intended as an aid in the diagnosis of influenza from Nasopharyngeal swab specimens and should not be used as a sole basis for treatment. Nasal washings and aspirates are unacceptable for Xpert Xpress SARS-CoV-2/FLU/RSV testing.  Fact Sheet for Patients: BloggerCourse.com  Fact Sheet for Healthcare Providers: SeriousBroker.it  This test is not yet approved or cleared by the [place] FDA and has been authorized for detection and/or diagnosis of SARS-CoV-2 by FDA under an Emergency Use Authorization (EUA). This EUA will remain in effect (meaning this test can be used) for the duration of the COVID-19 declaration under Section 564(b)(1) of the Act, 21 U.S.C. section 360bbb-3(b)(1), unless the authorization is terminated or revoked.  Performed at [facility], [address] [place], [place] [address]   Culture, blood (single)     Status: Abnormal    Collection Time: 02/04/21  4:40 AM   Specimen: BLOOD  Result Value Ref Range Status   Specimen Description   Final    BLOOD RIGHT ANTECUBITAL Performed at [facility], [address], [place], [place] [address]    Special Requests   Final    BOTTLES DRAWN AEROBIC AND ANAEROBIC Blood Culture adequate volume Performed at [facility], [address], [place], [place] [address]    Culture  Setup Time   Final    GRAM NEGATIVE RODS IN BOTH AEROBIC AND ANAEROBIC BOTTLES CRITICAL RESULT CALLED TO, READ BACK BY AND VERIFIED WITH: LISA KLUTTZ AT 1543 ON 02/04/21 BY SS Performed at [facility], [address], [place], [place] [address]    Culture ESCHERICHIA COLI (A)  Final   Report Status 02/06/2021 FINAL  Final   Organism ID, Bacteria ESCHERICHIA COLI  Final      Susceptibility   Escherichia coli - MIC*    AMPICILLIN >=32 RESISTANT Resistant     CEFAZOLIN <=4 SENSITIVE Sensitive     CEFEPIME <=0.12 SENSITIVE Sensitive     CEFTAZIDIME <=1 SENSITIVE Sensitive     CEFTRIAXONE <=0.25 SENSITIVE Sensitive     CIPROFLOXACIN <=0.25 SENSITIVE Sensitive     GENTAMICIN <=1 SENSITIVE Sensitive     IMIPENEM <=0.25 SENSITIVE Sensitive     TRIMETH/SULFA >=320 RESISTANT Resistant     AMPICILLIN/SULBACTAM 16 INTERMEDIATE Intermediate     PIP/TAZO <=4 SENSITIVE Sensitive     * ESCHERICHIA COLI  Blood Culture ID Panel (Reflexed)     Status: Abnormal   Collection Time: 02/04/21  4:40 AM  Result Value Ref Range Status   Enterococcus faecalis NOT DETECTED NOT DETECTED Final   Enterococcus Faecium NOT DETECTED NOT DETECTED Final   Listeria monocytogenes NOT DETECTED NOT DETECTED Final   Staphylococcus species NOT DETECTED NOT DETECTED Final   Staphylococcus aureus (BCID) NOT DETECTED NOT DETECTED Final  Staphylococcus epidermidis NOT DETECTED NOT DETECTED Final   Staphylococcus lugdunensis NOT DETECTED NOT DETECTED Final   Streptococcus species NOT DETECTED NOT DETECTED  Final   Streptococcus agalactiae NOT DETECTED NOT DETECTED Final   Streptococcus pneumoniae NOT DETECTED NOT DETECTED Final   Streptococcus pyogenes NOT DETECTED NOT DETECTED Final   A.calcoaceticus-baumannii NOT DETECTED NOT DETECTED Final   Bacteroides fragilis NOT DETECTED NOT DETECTED Final   Enterobacterales DETECTED (A) NOT DETECTED Final    Comment: Enterobacterales represent a large order of gram negative bacteria, not a single organism. CRITICAL RESULT CALLED TO, READ BACK BY AND VERIFIED WITH: LISA KLUTTZ AT 1543 ON 02/04/21 BY SS    Enterobacter cloacae complex NOT DETECTED NOT DETECTED Final   Escherichia coli DETECTED (A) NOT DETECTED Final    Comment: CRITICAL RESULT CALLED TO, READ BACK BY AND VERIFIED WITH: LISA KLUTTZ AT 1543 ON 02/04/21 BY SS    Klebsiella aerogenes NOT DETECTED NOT DETECTED Final   Klebsiella oxytoca NOT DETECTED NOT DETECTED Final   Klebsiella pneumoniae NOT DETECTED NOT DETECTED Final   Proteus species NOT DETECTED NOT DETECTED Final   Salmonella species NOT DETECTED NOT DETECTED Final   Serratia marcescens NOT DETECTED NOT DETECTED Final   Haemophilus influenzae NOT DETECTED NOT DETECTED Final   Neisseria meningitidis NOT DETECTED NOT DETECTED Final   Pseudomonas aeruginosa NOT DETECTED NOT DETECTED Final   Stenotrophomonas maltophilia NOT DETECTED NOT DETECTED Final   Candida albicans NOT DETECTED NOT DETECTED Final   Candida auris NOT DETECTED NOT DETECTED Final   Candida glabrata NOT DETECTED NOT DETECTED Final   Candida krusei NOT DETECTED NOT DETECTED Final   Candida parapsilosis NOT DETECTED NOT DETECTED Final   Candida tropicalis NOT DETECTED NOT DETECTED Final   Cryptococcus neoformans/gattii NOT DETECTED NOT DETECTED Final   CTX-M ESBL NOT DETECTED NOT DETECTED Final   Carbapenem resistance IMP NOT DETECTED NOT DETECTED Final   Carbapenem resistance KPC NOT DETECTED NOT DETECTED Final   Carbapenem resistance NDM NOT DETECTED NOT  DETECTED Final   Carbapenem resist OXA 48 LIKE NOT DETECTED NOT DETECTED Final   Carbapenem resistance VIM NOT DETECTED NOT DETECTED Final    Comment: Performed at [facility], [address], [place], [place] [address]  Urine Culture     Status: None   Collection Time: 02/04/21  6:14 PM   Specimen: Urine, Random  Result Value Ref Range Status   Specimen Description   Final    URINE, RANDOM Performed at [facility], [address], [place], [place] [address]    Special Requests   Final    NONE Performed at [facility], [address] [place], [place] [address]    Culture   Final    NO GROWTH Performed at [facility], [address] [place], [place] [address]    Report Status 02/05/2021 FINAL  Final     Discharge Instructions:   Discharge Instructions     Diet - low sodium heart healthy   Complete by: As directed    Increase activity slowly   Complete by: As directed       Allergies as of 02/12/2021       Reactions   Neosporin [neomycin-bacitracin Zn-polymyx] Itching, Swelling   Keflex [cephalexin] Rash        Medication List     STOP taking these medications    haloperidol 0.5 MG tablet Commonly known as: HALDOL   ondansetron 4 MG disintegrating tablet Commonly  known as: Zofran ODT   oxyCODONE 5 MG immediate release tablet Commonly known as: Roxicodone   potassium chloride SA 20 MEQ tablet Commonly known as: KLOR-CON M       TAKE these medications    acetaminophen 500 MG tablet Commonly known as: TYLENOL Take 500 mg by mouth every 6 (six) hours as needed for moderate pain or mild pain.   apixaban 5 MG Tabs tablet Commonly known as: ELIQUIS Take 5 mg by mouth 2 (two) times daily.   hydrochlorothiazide 50 MG tablet Commonly known as: HYDRODIURIL Take 50 mg by mouth daily.   losartan 50 MG tablet Commonly known as: COZAAR Take 1 tablet by mouth daily.   Magnesium 250 MG Tabs Take 250 mg by mouth 2  (two) times daily.   metoprolol succinate 25 MG 24 hr tablet Commonly known as: TOPROL-XL Take 12.5 mg by mouth at bedtime.   oxybutynin 10 MG 24 hr tablet Commonly known as: DITROPAN-XL Take 10 mg by mouth 2 (two) times daily.   pantoprazole 40 MG tablet Commonly known as: Protonix Take 1 tablet (40 mg total) by mouth daily.   Potassium Chloride ER 20 MEQ Tbcr Take 40 mEq by mouth 2 (two) times daily.           If you experience worsening of your admission symptoms, develop shortness of breath, life threatening emergency, suicidal or homicidal thoughts you must seek medical attention immediately by calling 911 or calling your MD immediately  if symptoms less severe.   You must read complete instructions/literature along with all the possible adverse reactions/side effects for all the medicines you take and that have been prescribed to you. Take any new medicines after you have completely understood and accept all the possible adverse reactions/side effects.    Please note   You were cared for by a hospitalist during your hospital stay. If you have any questions about your discharge medications or the care you received while you were in the hospital after you are discharged, you can call the unit and asked to speak with the hospitalist on call if the hospitalist that took care of you is not available. Once you are discharged, your primary care physician will handle any further medical issues. Please note that NO REFILLS for any discharge medications will be authorized once you are discharged, as it is imperative that you return to your primary care physician (or establish a relationship with a primary care physician if you do not have one) for your aftercare needs so that they can reassess your need for medications and monitor your lab values.       Time coordinating discharge: 35 minutes  Signed:  Abraham Entwistle  [facility] 02/12/2021, 10:58 AM   Pager on  [email or web address]. If 7PM-7AM, please contact night-coverage at [email or web address]

## 2021-02-12 NOTE — TOC Progression Note (Signed)
Transition of Care [facility]) - Progression Note    Patient Details  Name: KAMANI SCHROEDER MRN: [id number] Date of Birth: Nov 22, 1926  Transition of Care [facility]) CM/SW Cullen, RN Phone Number: 02/12/2021, 12:33 PM  Clinical Narrative:   Spoke with The daughter and notified her that the patient is going to room 401 at [facility] and the patient is next on list    Expected Discharge Plan: [place] Barriers to Discharge: Continued Medical Work up  Expected Discharge Plan and Services Expected Discharge Plan: [place]       Living arrangements for the past 2 months: Single Family Home Expected Discharge Date: 02/12/21                                     Social Determinants of Health (SDOH) Interventions    Readmission Risk Interventions No flowsheet data found.

## 2021-02-12 NOTE — Progress Notes (Signed)
Report given to Melody RN of [employer].

## 2021-02-12 NOTE — Progress Notes (Signed)
Occupational Therapy Treatment Patient Details Name: Vanessa Logan MRN: [id number] DOB: 20-Dec-1926 Today's Date: 02/12/2021   History of present illness Vanessa Logan is a 89yoF who comes to [facility] on 02/03/21 after persisten right hip pain s/p fall off commode 2 days PTA. PMH: HTN, AF on eliquis, demention. hip pelvis imaging unrevealing of any fracture. Pt has been able to AMB and weight bear with RW. Workup revealing of PNA.   OT comments  Upon entering the room, pt supine in bed with no c/o pain and reports feeling better. RN arrived to room and requesting to do skin check. Pt following commands for technique and rolled to the R with min A. Supine >sit with mod A to EOB for trunk support and BLEs. Pt sitting EOB with min guard - min A for balance while combing hair. Pt does fatigue quickly and asked to return to bed after sitting for ~ 6 minutes. Mod A to return to supine and repositioned for comfort. All needs within reach and bed alarm activated. Pt continues to benefit from OT intervention with recommendation for SNF at discharge.    Recommendations for follow up therapy are one component of a multi-disciplinary discharge planning process, led by the attending physician.  Recommendations may be updated based on patient status, additional functional criteria and insurance authorization.    Follow Up Recommendations  Skilled nursing-short term rehab (<3 hours/day)    Assistance Recommended at Discharge Frequent or constant Supervision/Assistance  Patient can return home with the following  A lot of help with walking and/or transfers;A lot of help with bathing/dressing/bathroom;Help with stairs or ramp for entrance;Direct supervision/assist for medications management;Direct supervision/assist for financial management;Assistance with cooking/housework   Equipment Recommendations  Other (comment) (defer to next venue of care)       Precautions / Restrictions Precautions Precautions:  Fall Restrictions Weight Bearing Restrictions: No       Mobility Bed Mobility Overal bed mobility: Needs Assistance Bed Mobility: Supine to Sit, Sit to Supine     Supine to sit: Mod assist Sit to supine: Mod assist   General bed mobility comments: assist for trunk and LEs    Transfers                         Balance Overall balance assessment: Needs assistance Sitting-balance support: Bilateral upper extremity supported, Feet supported Sitting balance-Leahy Scale: Fair                                     ADL either performed or assessed with clinical judgement   ADL Overall ADL's : Needs assistance/impaired     Grooming: Wash/dry hands;Wash/dry face;Brushing hair;Minimal assistance;Sitting                                      Extremity/Trunk Assessment Upper Extremity Assessment Upper Extremity Assessment: Generalized weakness   Lower Extremity Assessment Lower Extremity Assessment: Generalized weakness   Cervical / Trunk Assessment Cervical / Trunk Assessment: Kyphotic    Vision Patient Visual Report: No change from baseline            Cognition Arousal/Alertness: Awake/alert Behavior During Therapy: WFL for tasks assessed/performed Overall Cognitive Status: Within Functional Limits for tasks assessed  General Comments: Pt is pleasant and cooperative                   Pertinent Vitals/ Pain       Pain Assessment Pain Assessment: No/denies pain         Frequency  Min 2X/week        Progress Toward Goals  OT Goals(current goals can now be found in the care plan section)  Progress towards OT goals: Progressing toward goals  Acute Rehab OT Goals Patient Stated Goal: to get better OT Goal Formulation: With patient/family Time For Goal Achievement: 02/20/21 Potential to Achieve Goals: Good  Plan Discharge plan remains appropriate;Frequency remains  appropriate       AM-PAC OT "6 Clicks" Daily Activity     Outcome Measure   Help from another person eating meals?: None Help from another person taking care of personal grooming?: A Little Help from another person toileting, which includes using toliet, bedpan, or urinal?: Total Help from another person bathing (including washing, rinsing, drying)?: A Lot Help from another person to put on and taking off regular upper body clothing?: A Little Help from another person to put on and taking off regular lower body clothing?: A Lot 6 Click Score: 15    End of Session Equipment Utilized During Treatment: Rolling walker (2 wheels)  OT Visit Diagnosis: Unsteadiness on feet (R26.81)   Activity Tolerance Patient limited by fatigue   Patient Left in bed;with call bell/phone within reach;with bed alarm set   Nurse Communication Mobility status        Time: 5176-1607 OT Time Calculation (min): 23 min  Charges: OT General Charges $OT Visit: 1 Visit OT Treatments $Self Care/Home Management : 23-37 mins  Jackquline Denmark, MS, OTR/L , CBIS ascom [phone]  02/12/21, 12:59 PM

## 2021-02-12 NOTE — Care Management Important Message (Signed)
Important Message  Patient Details  Name: Vanessa Logan MRN: [id number] Date of Birth: 10-Jan-1927   Medicare Important Message Given:  Yes     Olegario Messier A Porschea Borys 02/12/2021, 10:55 AM

## 2021-02-25 ENCOUNTER — Emergency Department
Admission: EM | Admit: 2021-02-25 | Discharge: 2021-02-25 | Disposition: A | Discharge status: Skilled Nursing Facility | Payer: Medicare Other | Attending: Emergency Medicine | Admitting: Emergency Medicine

## 2021-02-25 ENCOUNTER — Emergency Department: Payer: Medicare Other

## 2021-02-25 ENCOUNTER — Other Ambulatory Visit: Payer: Self-pay

## 2021-02-25 DIAGNOSIS — Z20822 Contact with and (suspected) exposure to covid-19: Secondary | ICD-10-CM | POA: Insufficient documentation

## 2021-02-25 DIAGNOSIS — R0602 Shortness of breath: Secondary | ICD-10-CM

## 2021-02-25 DIAGNOSIS — J90 Pleural effusion, not elsewhere classified: Secondary | ICD-10-CM | POA: Diagnosis not present

## 2021-02-25 LAB — CBC
HCT: 42.9 % (ref 36.0–46.0)
Hemoglobin: 13.2 g/dL (ref 12.0–15.0)
MCH: 27.7 pg (ref 26.0–34.0)
MCHC: 30.8 g/dL (ref 30.0–36.0)
MCV: 89.9 fL (ref 80.0–100.0)
Platelets: 238 10*3/uL (ref 150–400)
RBC: 4.77 MIL/uL (ref 3.87–5.11)
RDW: 15.2 % (ref 11.5–15.5)
WBC: 7.3 10*3/uL (ref 4.0–10.5)
nRBC: 0 % (ref 0.0–0.2)

## 2021-02-25 LAB — BASIC METABOLIC PANEL
Anion gap: 3 — ABNORMAL LOW (ref 5–15)
BUN: 23 mg/dL (ref 8–23)
CO2: 29 mmol/L (ref 22–32)
Calcium: 8.5 mg/dL — ABNORMAL LOW (ref 8.9–10.3)
Chloride: 107 mmol/L (ref 98–111)
Creatinine, Ser: 1.12 mg/dL — ABNORMAL HIGH (ref 0.44–1.00)
GFR, Estimated: 46 mL/min — ABNORMAL LOW (ref >60)
Glucose, Bld: 101 mg/dL — ABNORMAL HIGH (ref 70–99)
Potassium: 5 mmol/L (ref 3.5–5.1)
Sodium: 139 mmol/L (ref 135–145)

## 2021-02-25 LAB — RESP PANEL BY RT-PCR (FLU A&B, COVID) ARPGX2
Influenza A by PCR: NEGATIVE
Influenza B by PCR: NEGATIVE
SARS Coronavirus 2 by RT PCR: NEGATIVE

## 2021-02-25 NOTE — ED Notes (Signed)
Called EMS for transport back to [facility]

## 2021-02-25 NOTE — ED Notes (Signed)
Pt presents to the ED with daughter for concerns of a sore throat and SOB. Has not been feeling well for the past couple of days. She did not participate in therapy on Monday. Developed sore throat and some SOB which she told staff about today. Slight pressure of the chest. Rates pressure 1/10. States that she has been coughing but Daughter has not witnessed her cough. A&Ox4. Skin p/w/d. RR even and nonlabored. She comes from rehab facility that she is recovering at from a hospital stay for pneumoia. Afebrile.

## 2021-02-25 NOTE — ED Provider Notes (Signed)
[facility] Provider Note    Event Date/Time   First MD Initiated Contact with Patient 02/25/21 2033     (approximate)   History   Shortness of Breath   HPI  Vanessa Logan is a 88 y.o. female with a history of atrial fibrillation, pneumonia in the past who presents with complaints of shortness of breath.  Patient was recently treated for possible pneumonia/edema per review of records.  She reports she felt like she was "drowning earlier today ", feeling somewhat better now but still with mild shortness of breath.  No fevers, no calf pain or swelling.  She does not smoke     Physical Exam   Triage Vital Signs: ED Triage Vitals  Enc Vitals Group     BP 02/25/21 1805 128/80     Pulse Rate 02/25/21 1805 75     Resp 02/25/21 1805 18     Temp 02/25/21 1805 98.9 F (37.2 C)     Temp src --      SpO2 02/25/21 1805 97 %     Weight --      Height --      Head Circumference --      Peak Flow --      Pain Score 02/25/21 1802 0     Pain Loc --      Pain Edu? --      Excl. in GC? --     Most recent vital signs: Vitals:   02/25/21 1805 02/25/21 2130  BP: 128/80 127/77  Pulse: 75 74  Resp: 18 18  Temp: 98.9 F (37.2 C)   SpO2: 97% 98%     General: Awake, no distress.  CV:  Good peripheral perfusion.  No murmurs Resp:  Normal effort.  Clear to auscultation bilaterally Abd:  No distention.  No tenderness to palpation Other:  No calf pain or swelling   ED Results / Procedures / Treatments   Labs (all labs ordered are listed, but only abnormal results are displayed) Labs Reviewed  BASIC METABOLIC PANEL - Abnormal; Notable for the following components:      Result Value   Glucose, Bld 101 (*)    Creatinine, Ser 1.12 (*)    Calcium 8.5 (*)    GFR, Estimated 46 (*)    Anion gap 3 (*)    All other components within normal limits  RESP PANEL BY RT-PCR (FLU A&B, COVID) ARPGX2  CBC     EKG ED ECG REPORT I, Jene Every, the attending  physician, personally viewed and interpreted this ECG.  Date: 02/25/2021  Rhythm: Atrial fibrillation QRS Axis: normal Intervals: Abnormal ST/T Wave abnormalities: normal Narrative Interpretation: no evidence of acute ischemia     RADIOLOGY Chest x-ray viewed by me, no acute abnormality    PROCEDURES:  Critical Care performed:   Procedures   MEDICATIONS ORDERED IN ED: Medications - No data to display   IMPRESSION / MDM / ASSESSMENT AND PLAN / ED COURSE  I reviewed the triage vital signs and the nursing notes.  Patient presents with shortness of breath as detailed above.  She is mildly tachypneic, oxygen saturations are reassuring.  No wheezing on exam, no significant Rales.  Chest x-ray demonstrates improved aeration, COVID swab and flu swab are negative, CBC demonstrates normal white blood cell count, BMP is unremarkable  Will send for CT scan given her tachypnea, she did have a CTA which was negative for PE several weeks ago  Repeat CT  scan demonstrates mildly increased effusions but otherwise stable  Patient has been monitored in the emergency department, vitals have been quite reassuring oxygen saturation above 90% the entire time  Considered admission but discussed with patient and with daughter that given no clear treatable cause and very stable vitals appropriate for outpatient follow-up with pulmonology, they agree with this plan.  We discussed return precautions         FINAL CLINICAL IMPRESSION(S) / ED DIAGNOSES   Final diagnoses:  Pleural effusion  SOB (shortness of breath)     Rx / DC Orders   ED Discharge Orders     None        Note:  This document was prepared using Dragon voice recognition software and may include unintentional dictation errors.   Jene Every, MD 02/25/21 2241

## 2021-02-25 NOTE — ED Triage Notes (Signed)
Pt comes with c/o sore throat and SOB. Pt states some sore throat. Pt is from [facility] and staff reported pt was SOb. MD also thinks pt may have pneumonia.  VSS per EMs

## 2021-02-25 NOTE — ED Notes (Signed)
Rainbow sent to the lab.  

## 2021-02-25 NOTE — ED Notes (Signed)
Attempted to call Report to [employer]. Waited on the line for several minutes pressing 18 or 19 to obtain a nurse for report. No one answered.

## 2022-05-07 IMAGING — CT CT CERVICAL SPINE W/O CM
3 of 4 series · 11 of 35 positions shown, 13 images · non-contrast
Comparison: 12/07/2017

CLINICAL DATA: Trip and fall, hematoma left forehead, left inner

EXAM:
CT HEAD WITHOUT CONTRAST
CT MAXILLOFACIAL WITHOUT CONTRAST
CT CERVICAL SPINE WITHOUT CONTRAST
TECHNIQUE: Multidetector CT imaging of the head, cervical spine, and
maxillofacial structures were performed using the standard protocol
without intravenous contrast. Multiplanar CT image reconstructions
of the cervical spine and maxillofacial structures were also
generated.

[Series 4: sagittal bone · sagittal · 0.25mm/px · 5 of 57 slices shown, 6 images]
[im 19/57  bone]
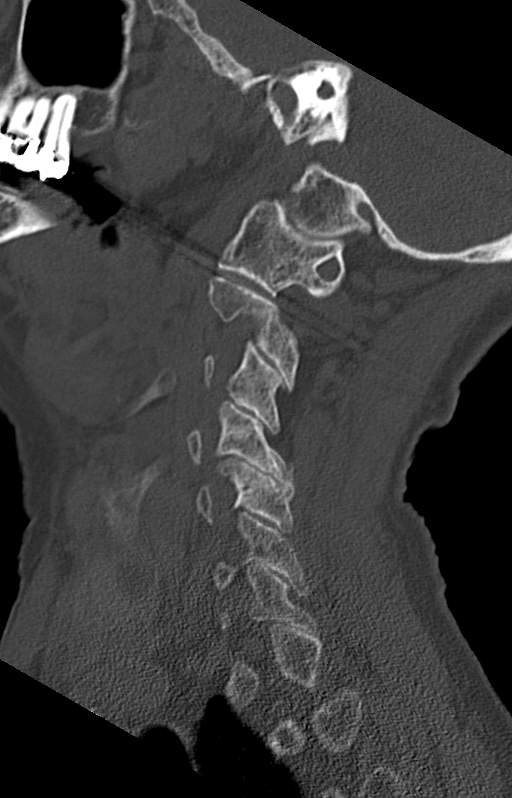
[im 24/57  bone]
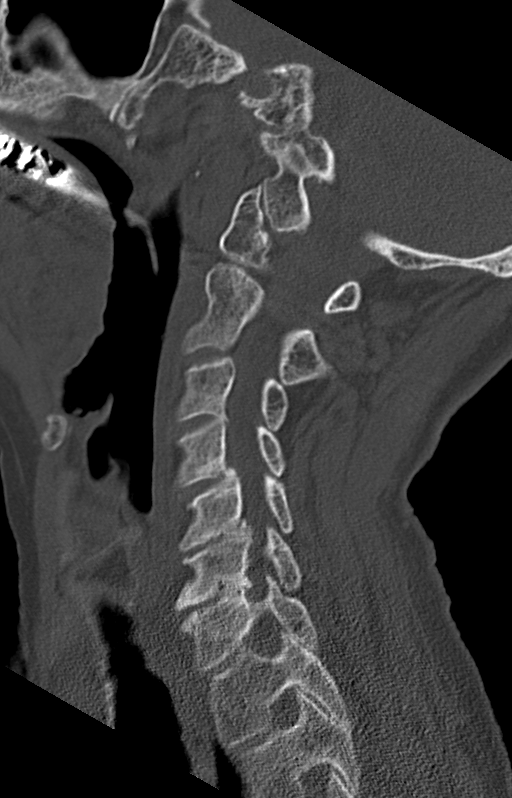
[im 29/57  soft-tissue]
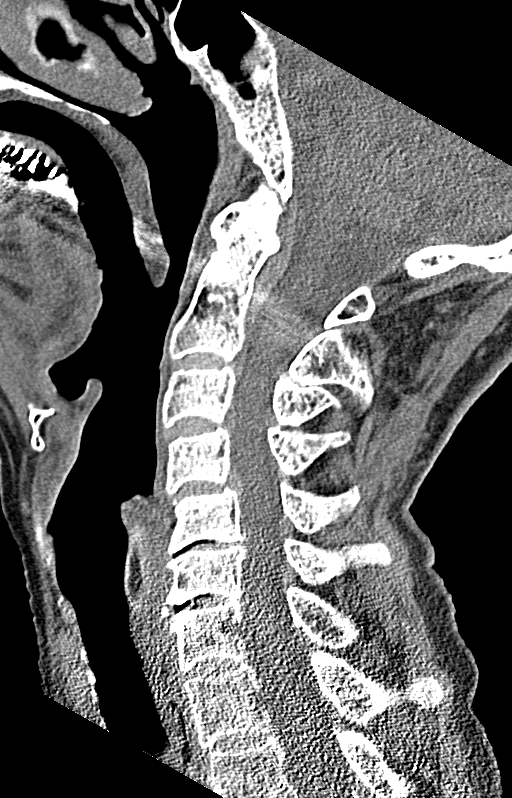
[im 29/57  bone]
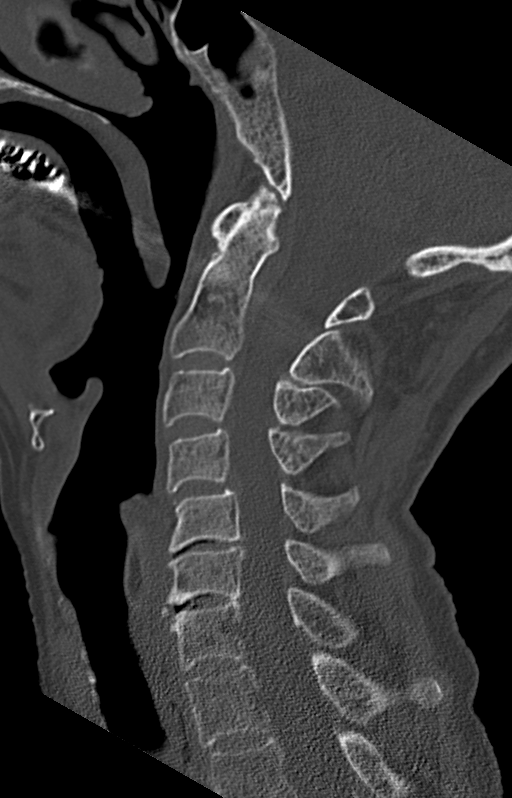
[im 33/57  bone]
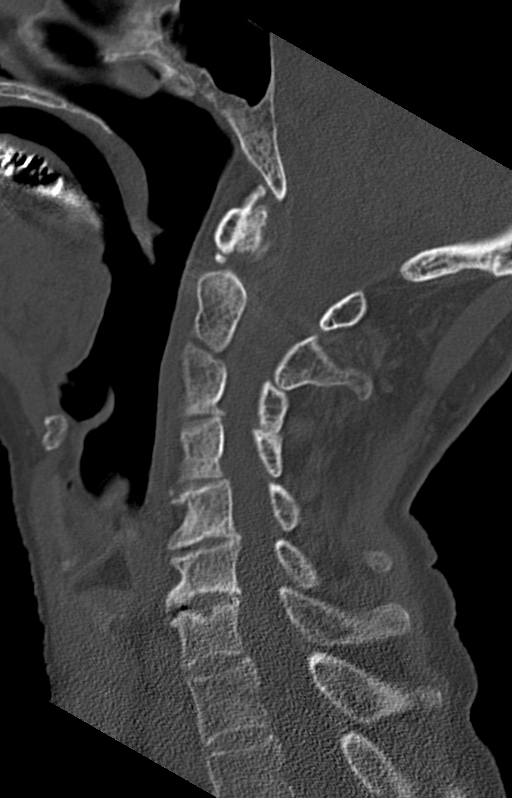
[im 38/57  bone]
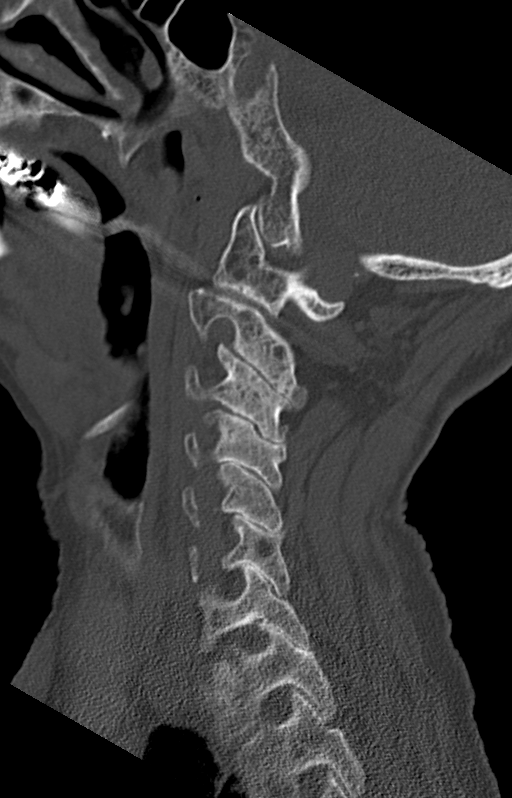

[Series 5: coronal bone · coronal · 0.22mm/px · 3 of 64 slices shown]
[im 17/64  bone]
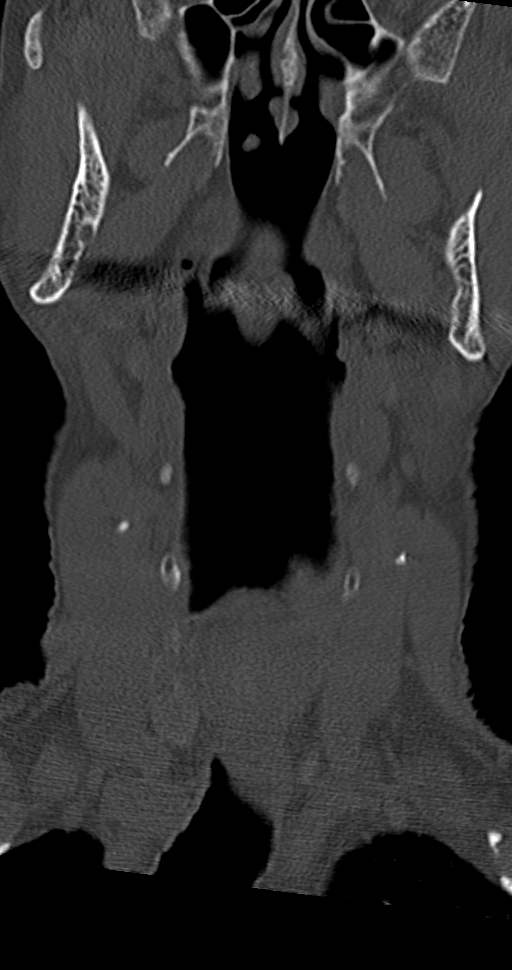
[im 27/64  bone]
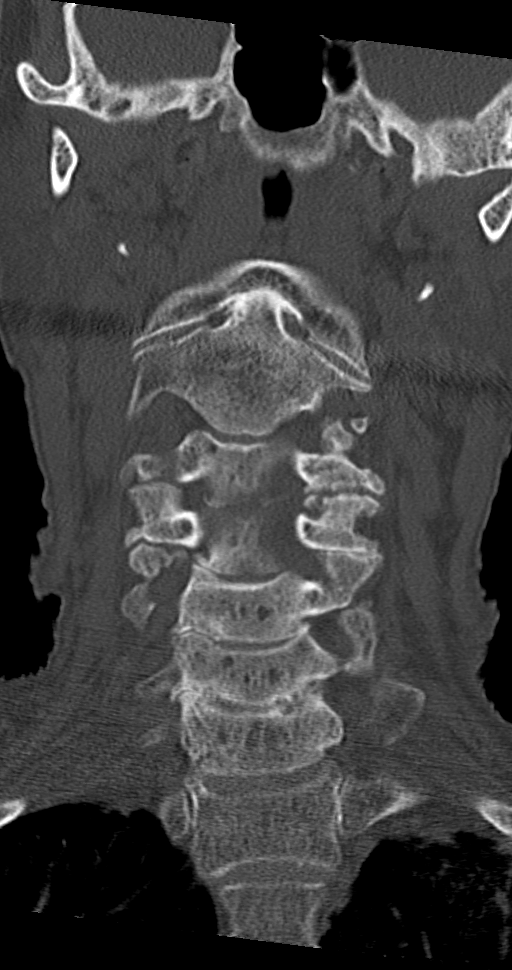
[im 37/64  bone]
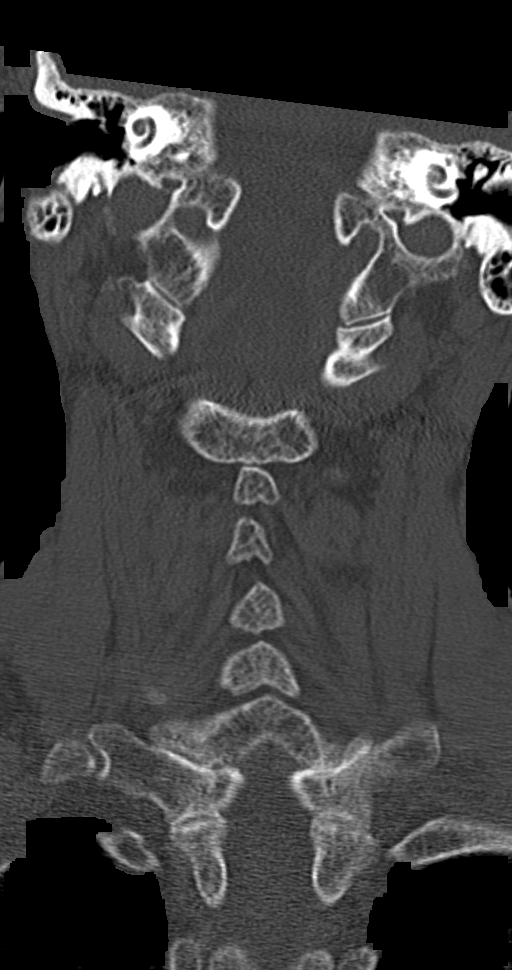

[Series 6: orthogonal axials · axial · 0.22mm/px · z∈[-332,-229]mm · 3 of 90 slices shown, 4 images]
[im 15/90  soft-tissue]
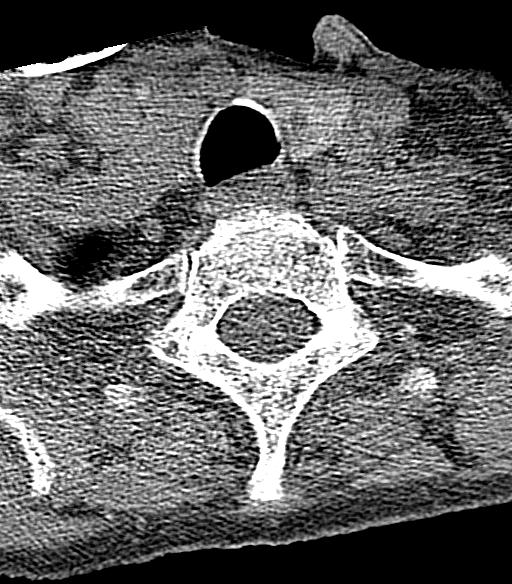
[im 15/90  bone]
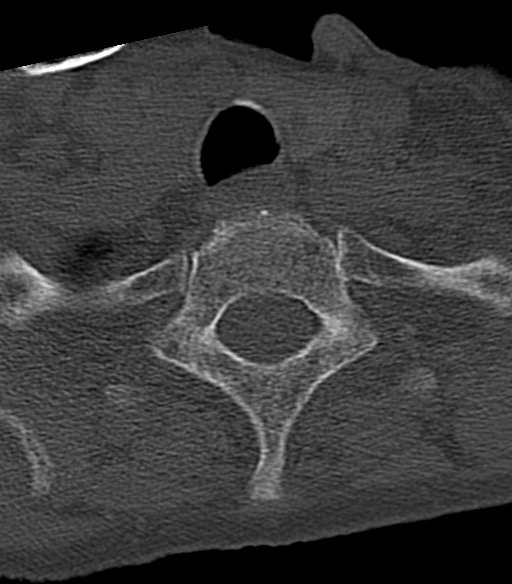
[im 45/90  bone]
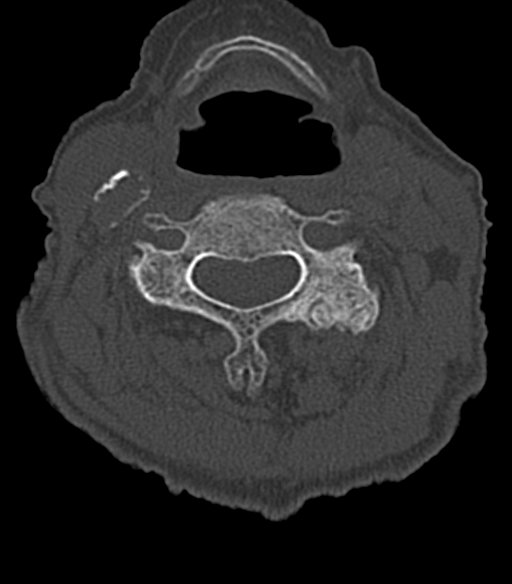
[im 75/90  bone]
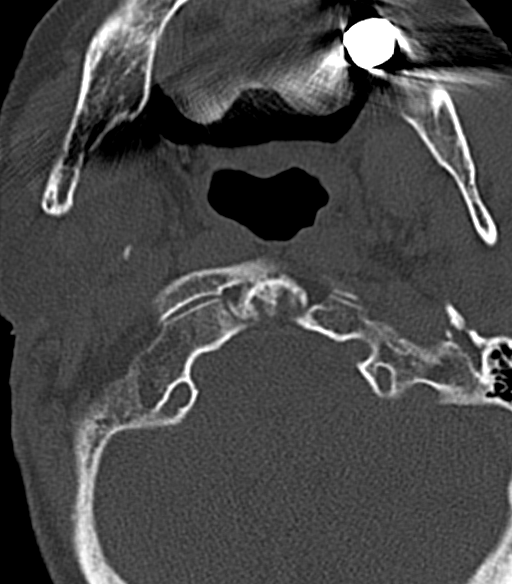

[11 of 35 positions shown; findings below may reference images not displayed]

FINDINGS: CT HEAD FINDINGS

Brain: No evidence of acute infarction, hemorrhage, hydrocephalus,
extra-axial collection or mass lesion/mass effect. Mild
periventricular and deep white matter hypodensity.

Vascular: No hyperdense vessel or unexpected calcification.

CT FACIAL BONES FINDINGS

Skull: Normal. Negative for fracture or focal lesion.

Facial bones: No displaced fractures or dislocations.

Sinuses/Orbits: No acute finding.

Other: Soft tissue hematoma over left forehead (series 2, image 12).

CT CERVICAL SPINE FINDINGS

Alignment: Normal.

Skull base and vertebrae: No acute fracture. No primary bone lesion
or focal pathologic process.

Soft tissues and spinal canal: No prevertebral fluid or swelling. No
visible canal hematoma.

Disc levels: Mild to moderate disc space height loss and
osteophytosis of the lower cervical spine.

Upper chest: Negative.

Other: None.
IMPRESSION: 1. No acute intracranial pathology. Mild small-vessel white matter
disease.
2. No displaced fracture or dislocation of the facial bones.
3. Soft tissue hematoma over left forehead.
4. No fracture or static subluxation of the cervical spine.

## 2022-05-07 IMAGING — DX DG WRIST 2V*L*
1 series · 1 of 1 positions shown · non-contrast
Comparison: 04/05/2020

CLINICAL DATA: Reduction of left wrist fracture

EXAM:
LEFT WRIST - 1  VIEW

[wrist lat]
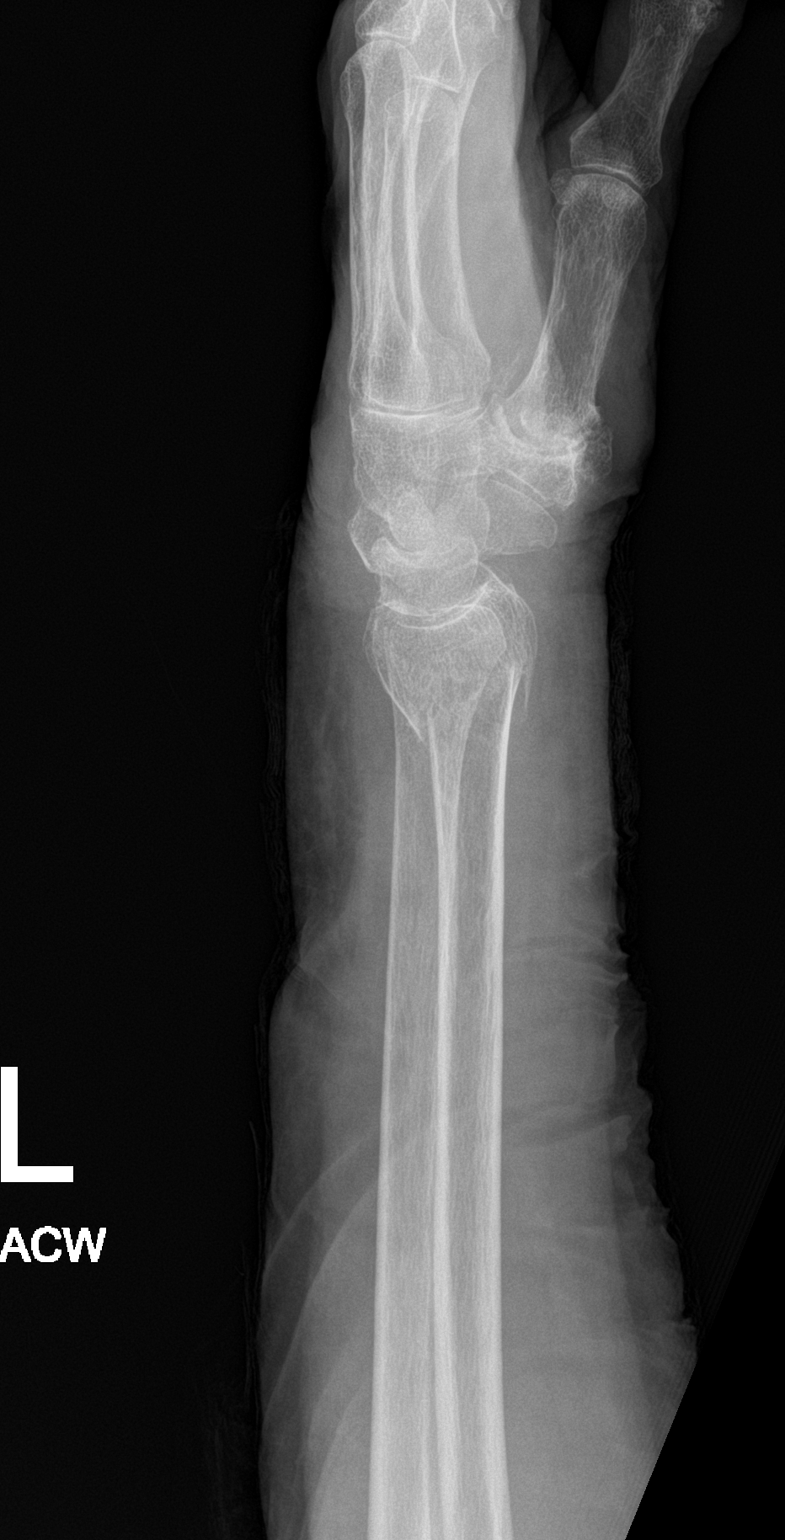

[1 of 1 positions shown; findings below may reference images not displayed]

FINDINGS: Single lateral view of the left wrist was obtained at the ordering
physician's request. The dorsal displacement of the comminuted
distal left radial fracture has been reduced, with slight residual
dorsal angulation measuring approximately 24 degrees. Diffuse soft
tissue edema.
IMPRESSION: 1. Reduction of the distal left radial fracture, with decreased
dorsal translation and residual dorsal angulation as above.

## 2022-05-07 IMAGING — DX DG WRIST 2V*L*
2 series · 2 of 2 positions shown · non-contrast
Comparison: April 05, 2020

CLINICAL DATA: Post reduction, LEFT wrist in splint.

EXAM:
LEFT WRIST - 2 VIEW

[wrist ap]
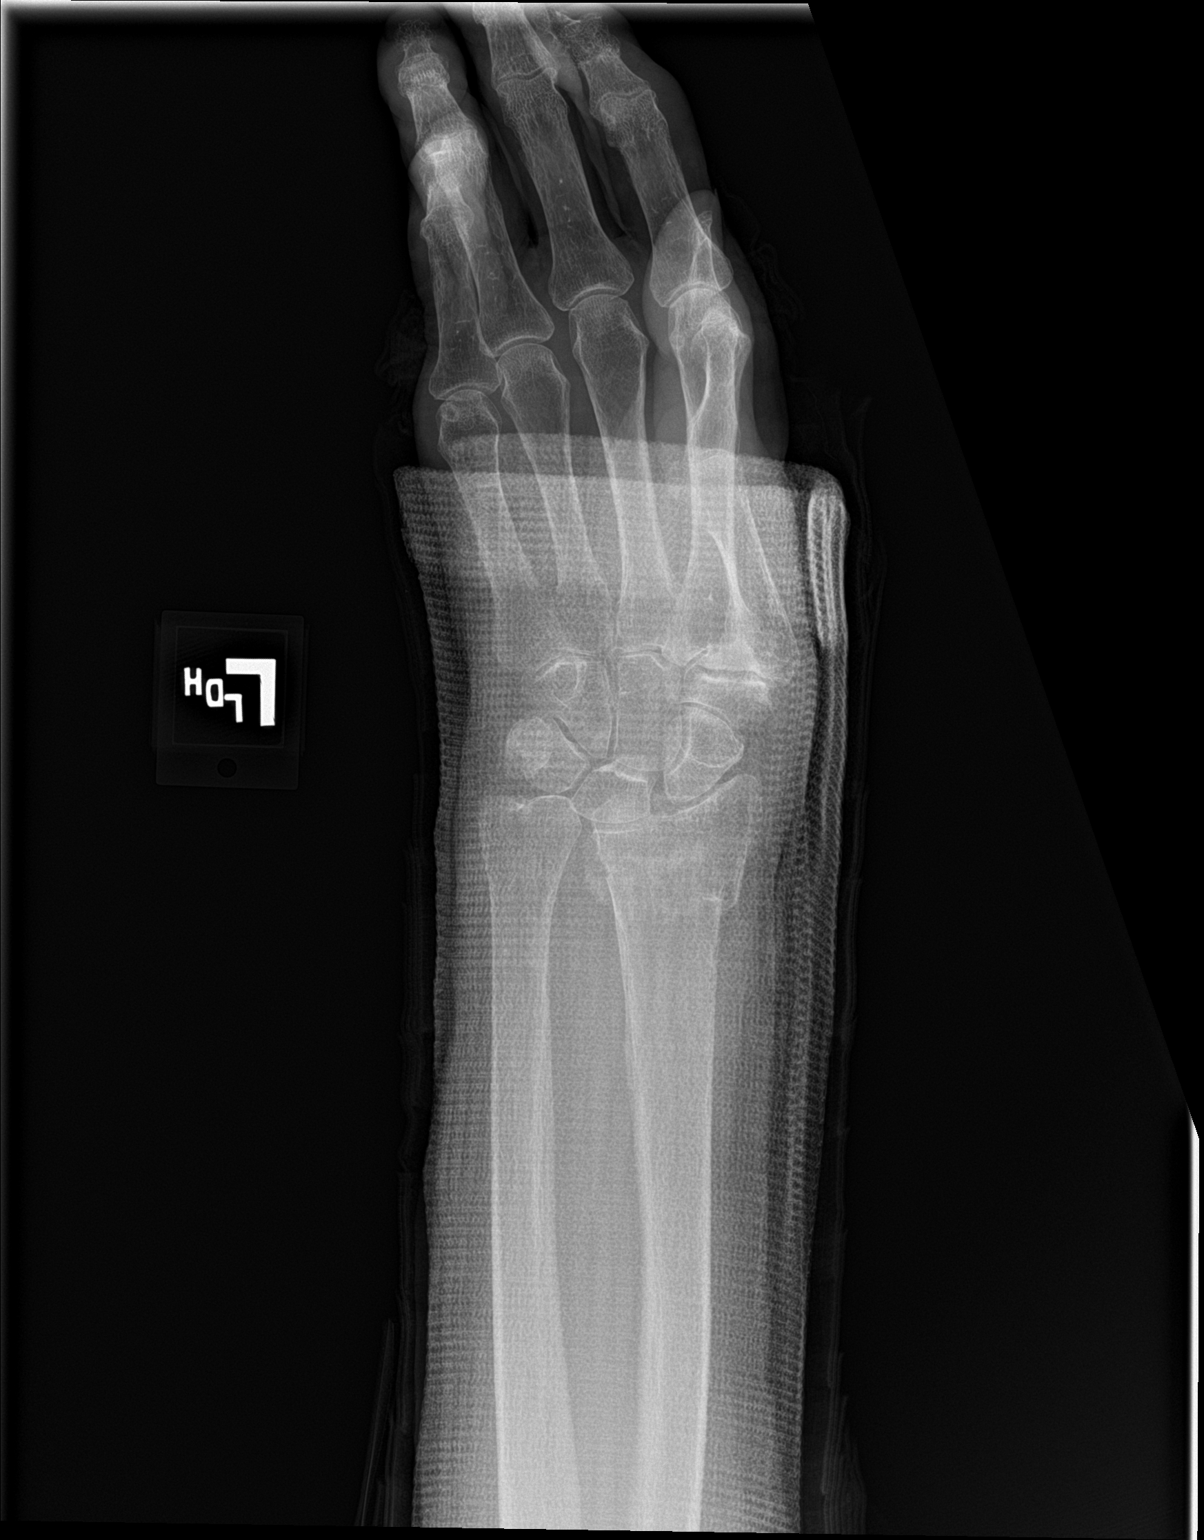

[wrist lat]
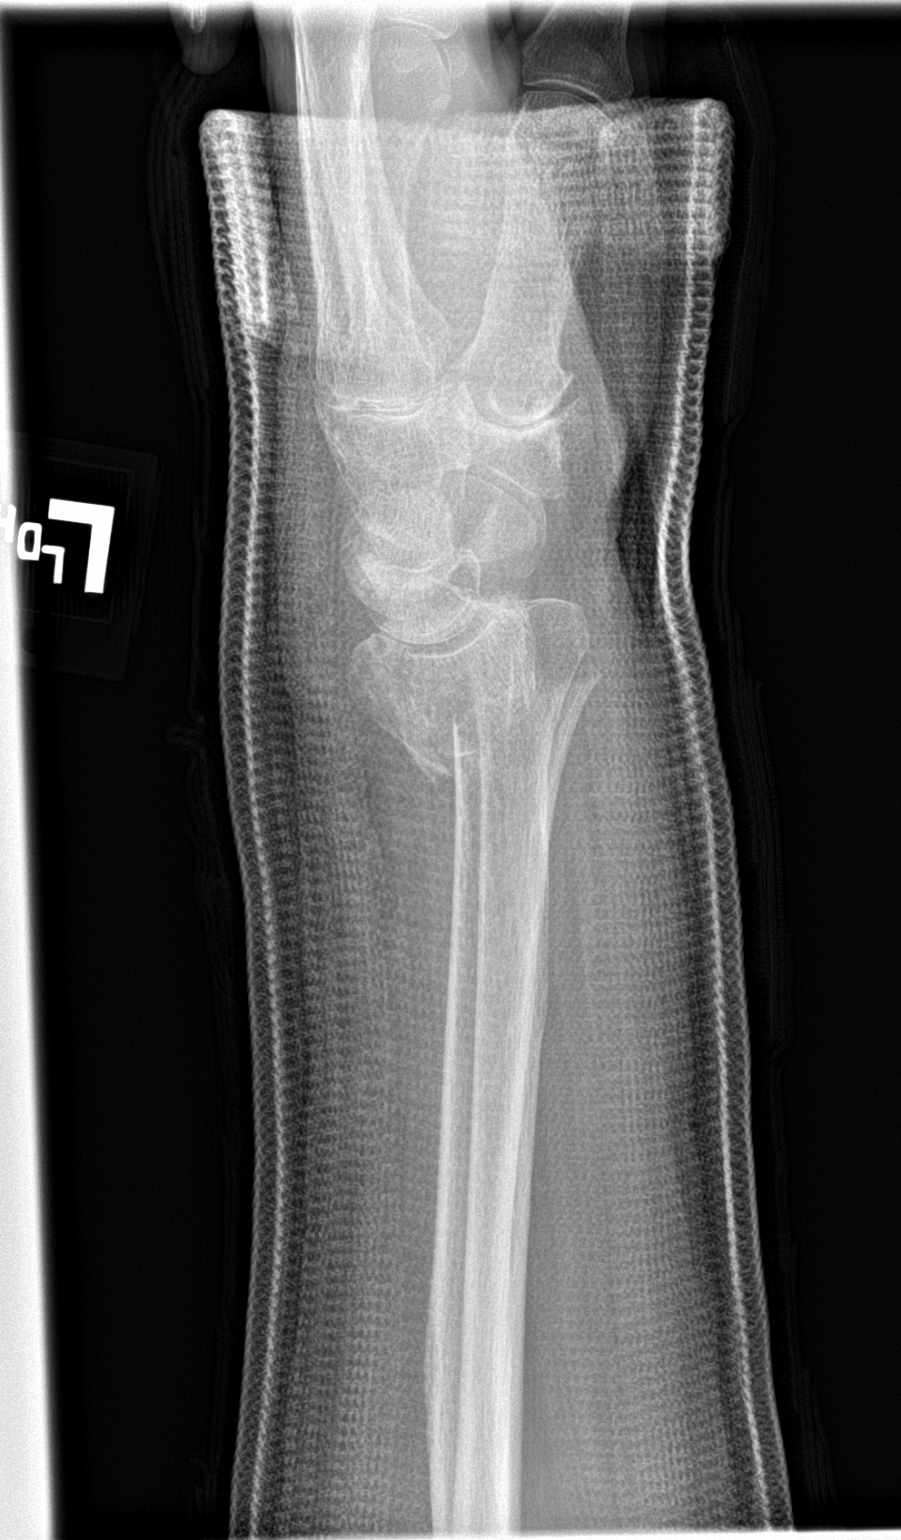

[2 of 2 positions shown; findings below may reference images not displayed]

FINDINGS: Overlying casting material limits bony detail. The foreshortened
distal radius due to impacted comminuted intra-articular fracture
with similar appearance. Dorsal tilt of distal radial articular
surface also similar in the setting of apex volar angulation at the
fracture site. Posterior displacement of distal radius associated
with dorsal tilt is similar accounting for differences in
projection.
IMPRESSION: Casting material overlying the distal radius limiting bony detail
without gross change of a comminuted, angulated and impacted
intra-articular fracture of the distal LEFT radius. There is still
moderate displacement in addition to angulation of the distal
radius.

## 2022-05-07 IMAGING — DX DG WRIST 2V*L*
1 series · 1 of 1 positions shown · non-contrast
Comparison: 04/05/2020 at [DATE] p.m.

CLINICAL DATA: Reduction of left wrist fracture

EXAM:
LEFT WRIST - 1 VIEW

[wrist lat]
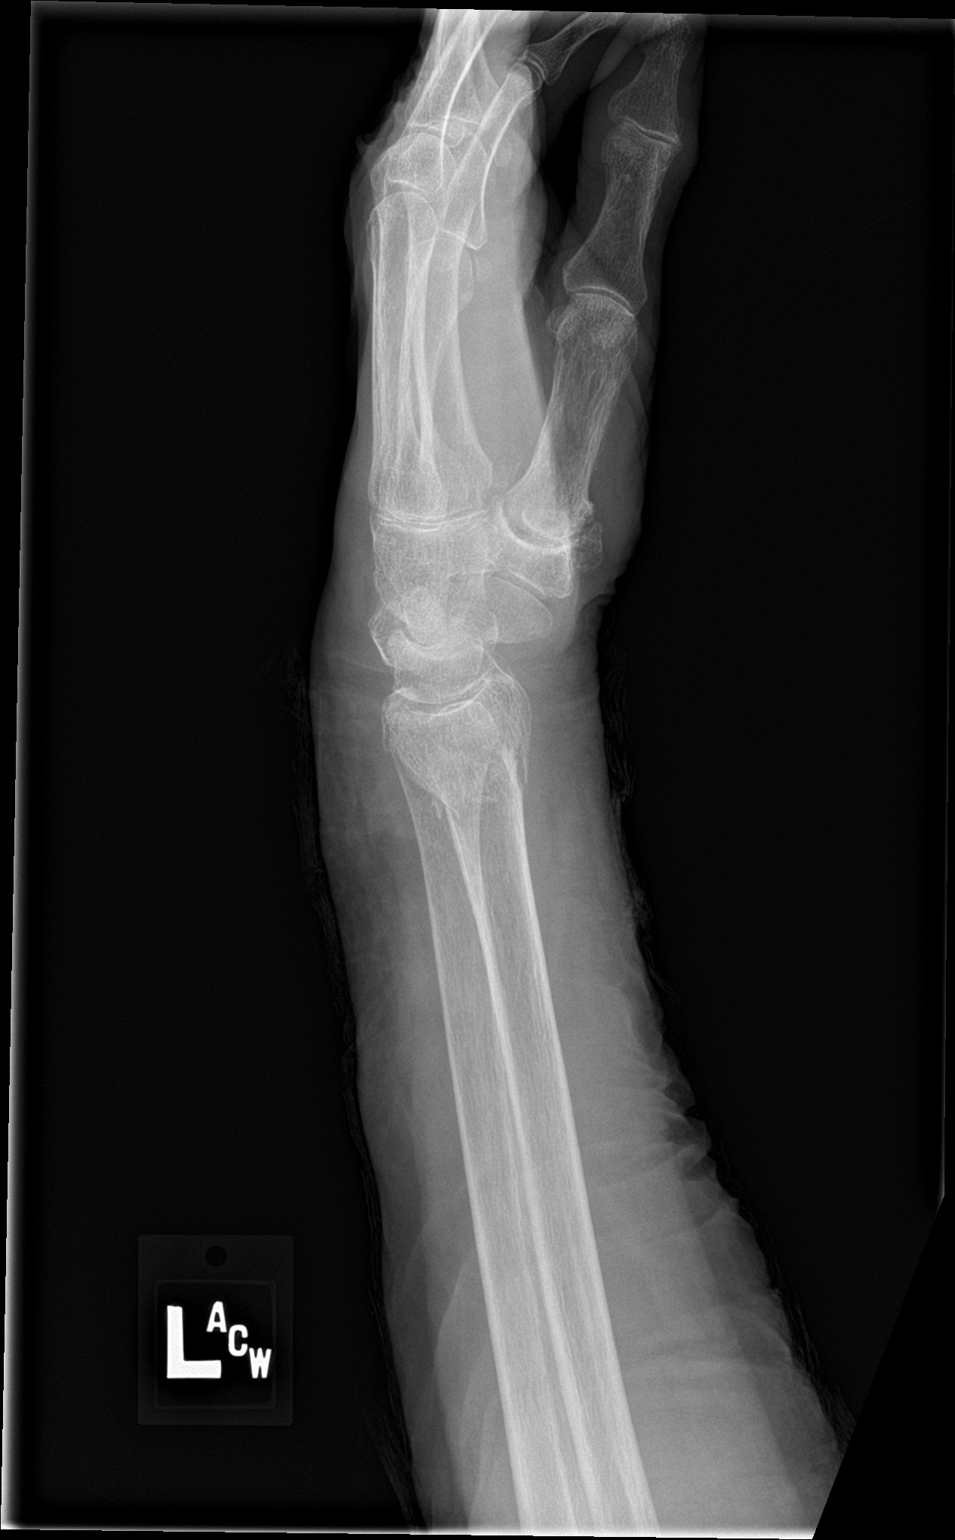

[1 of 1 positions shown; findings below may reference images not displayed]

FINDINGS: Single lateral view of the left wrist was obtained at the ordering
physician's request. There has been significant reduction of the
distal left radial fracture, with no residual dorsal translation.
Only minimal residual dorsal angulation measuring less than 10
degrees. Diffuse soft tissue edema.
IMPRESSION: 1. Significant reduction of the distal left radial fracture, with
only mild residual dorsal angulation at the fracture site.

## 2022-05-09 ENCOUNTER — Inpatient Hospital Stay
Admission: EM | Admit: 2022-05-09 | Discharge: 2022-05-13 | DRG: 291 | Disposition: A | Discharge status: Home Health | Payer: Medicare Other | Attending: Internal Medicine | Admitting: Internal Medicine

## 2022-05-09 ENCOUNTER — Other Ambulatory Visit: Payer: Self-pay

## 2022-05-09 ENCOUNTER — Emergency Department: Payer: Medicare Other

## 2022-05-09 DIAGNOSIS — R64 Cachexia: Secondary | ICD-10-CM | POA: Diagnosis present

## 2022-05-09 DIAGNOSIS — F03A0 Unspecified dementia, mild, without behavioral disturbance, psychotic disturbance, mood disturbance, and anxiety: Secondary | ICD-10-CM | POA: Diagnosis present

## 2022-05-09 DIAGNOSIS — I482 Chronic atrial fibrillation, unspecified: Secondary | ICD-10-CM | POA: Diagnosis present

## 2022-05-09 DIAGNOSIS — E872 Acidosis, unspecified: Secondary | ICD-10-CM | POA: Diagnosis present

## 2022-05-09 DIAGNOSIS — I4891 Unspecified atrial fibrillation: Secondary | ICD-10-CM | POA: Diagnosis present

## 2022-05-09 DIAGNOSIS — T502X5A Adverse effect of carbonic-anhydrase inhibitors, benzothiadiazides and other diuretics, initial encounter: Secondary | ICD-10-CM | POA: Diagnosis not present

## 2022-05-09 DIAGNOSIS — Z881 Allergy status to other antibiotic agents status: Secondary | ICD-10-CM

## 2022-05-09 DIAGNOSIS — E876 Hypokalemia: Secondary | ICD-10-CM | POA: Diagnosis present

## 2022-05-09 DIAGNOSIS — I161 Hypertensive emergency: Secondary | ICD-10-CM | POA: Diagnosis present

## 2022-05-09 DIAGNOSIS — Z66 Do not resuscitate: Secondary | ICD-10-CM | POA: Diagnosis present

## 2022-05-09 DIAGNOSIS — J441 Chronic obstructive pulmonary disease with (acute) exacerbation: Secondary | ICD-10-CM | POA: Diagnosis present

## 2022-05-09 DIAGNOSIS — I509 Heart failure, unspecified: Secondary | ICD-10-CM

## 2022-05-09 DIAGNOSIS — I872 Venous insufficiency (chronic) (peripheral): Secondary | ICD-10-CM | POA: Diagnosis present

## 2022-05-09 DIAGNOSIS — J810 Acute pulmonary edema: Secondary | ICD-10-CM

## 2022-05-09 DIAGNOSIS — J9601 Acute respiratory failure with hypoxia: Principal | ICD-10-CM | POA: Diagnosis present

## 2022-05-09 DIAGNOSIS — Z1152 Encounter for screening for COVID-19: Secondary | ICD-10-CM

## 2022-05-09 DIAGNOSIS — I130 Hypertensive heart and chronic kidney disease with heart failure and stage 1 through stage 4 chronic kidney disease, or unspecified chronic kidney disease: Principal | ICD-10-CM | POA: Diagnosis present

## 2022-05-09 DIAGNOSIS — N1831 Chronic kidney disease, stage 3a: Secondary | ICD-10-CM | POA: Diagnosis present

## 2022-05-09 DIAGNOSIS — R54 Age-related physical debility: Secondary | ICD-10-CM | POA: Diagnosis present

## 2022-05-09 DIAGNOSIS — Z803 Family history of malignant neoplasm of breast: Secondary | ICD-10-CM

## 2022-05-09 DIAGNOSIS — Z6822 Body mass index (BMI) 22.0-22.9, adult: Secondary | ICD-10-CM

## 2022-05-09 DIAGNOSIS — Z883 Allergy status to other anti-infective agents status: Secondary | ICD-10-CM

## 2022-05-09 DIAGNOSIS — Z7901 Long term (current) use of anticoagulants: Secondary | ICD-10-CM

## 2022-05-09 DIAGNOSIS — E669 Obesity, unspecified: Secondary | ICD-10-CM | POA: Diagnosis present

## 2022-05-09 DIAGNOSIS — Z79899 Other long term (current) drug therapy: Secondary | ICD-10-CM

## 2022-05-09 DIAGNOSIS — L8990 Pressure ulcer of unspecified site, unspecified stage: Secondary | ICD-10-CM | POA: Diagnosis present

## 2022-05-09 DIAGNOSIS — I081 Rheumatic disorders of both mitral and tricuspid valves: Secondary | ICD-10-CM | POA: Diagnosis present

## 2022-05-09 DIAGNOSIS — R55 Syncope and collapse: Secondary | ICD-10-CM | POA: Diagnosis not present

## 2022-05-09 DIAGNOSIS — F0390 Unspecified dementia without behavioral disturbance: Secondary | ICD-10-CM | POA: Diagnosis present

## 2022-05-09 DIAGNOSIS — L89151 Pressure ulcer of sacral region, stage 1: Secondary | ICD-10-CM | POA: Diagnosis present

## 2022-05-09 DIAGNOSIS — J189 Pneumonia, unspecified organism: Secondary | ICD-10-CM | POA: Diagnosis present

## 2022-05-09 DIAGNOSIS — I5033 Acute on chronic diastolic (congestive) heart failure: Secondary | ICD-10-CM | POA: Diagnosis present

## 2022-05-09 DIAGNOSIS — E785 Hyperlipidemia, unspecified: Secondary | ICD-10-CM | POA: Diagnosis present

## 2022-05-09 LAB — COMPREHENSIVE METABOLIC PANEL
ALT: 16 U/L (ref 0–44)
AST: 27 U/L (ref 15–41)
Albumin: 2.7 g/dL — ABNORMAL LOW (ref 3.5–5.0)
Alkaline Phosphatase: 127 U/L — ABNORMAL HIGH (ref 38–126)
Anion gap: 9 (ref 5–15)
BUN: 17 mg/dL (ref 8–23)
CO2: 24 mmol/L (ref 22–32)
Calcium: 8.1 mg/dL — ABNORMAL LOW (ref 8.9–10.3)
Chloride: 103 mmol/L (ref 98–111)
Creatinine, Ser: 1.07 mg/dL — ABNORMAL HIGH (ref 0.44–1.00)
GFR, Estimated: 48 mL/min — ABNORMAL LOW (ref >60)
Glucose, Bld: 119 mg/dL — ABNORMAL HIGH (ref 70–99)
Potassium: 3.8 mmol/L (ref 3.5–5.1)
Sodium: 136 mmol/L (ref 135–145)
Total Bilirubin: 1.2 mg/dL (ref 0.3–1.2)
Total Protein: 6.5 g/dL (ref 6.5–8.1)

## 2022-05-09 LAB — CBC
HCT: 45.1 % (ref 36.0–46.0)
Hemoglobin: 14 g/dL (ref 12.0–15.0)
MCH: 26.7 pg (ref 26.0–34.0)
MCHC: 31 g/dL (ref 30.0–36.0)
MCV: 86.1 fL (ref 80.0–100.0)
Platelets: 282 10*3/uL (ref 150–400)
RBC: 5.24 MIL/uL — ABNORMAL HIGH (ref 3.87–5.11)
RDW: 15 % (ref 11.5–15.5)
WBC: 9.7 10*3/uL (ref 4.0–10.5)
nRBC: 0 % (ref 0.0–0.2)

## 2022-05-09 LAB — RESP PANEL BY RT-PCR (RSV, FLU A&B, COVID)  RVPGX2
Influenza A by PCR: NEGATIVE
Influenza B by PCR: NEGATIVE
Resp Syncytial Virus by PCR: NEGATIVE
SARS Coronavirus 2 by RT PCR: NEGATIVE

## 2022-05-09 LAB — TROPONIN I (HIGH SENSITIVITY): Troponin I (High Sensitivity): 10 ng/L (ref <18)

## 2022-05-09 MED ORDER — HYDRALAZINE HCL 20 MG/ML IJ SOLN
5.0000 mg | Freq: Once | INTRAMUSCULAR | Status: AC
  Administered 2022-05-09: 5 mg via INTRAVENOUS
  Filled 2022-05-09: qty 1

## 2022-05-09 MED ORDER — NITROGLYCERIN 2 % TD OINT
0.5000 [in_us] | TOPICAL_OINTMENT | Freq: Once | TRANSDERMAL | Status: AC
  Administered 2022-05-09: 0.5 [in_us] via TOPICAL
  Filled 2022-05-09: qty 1

## 2022-05-09 MED ORDER — IPRATROPIUM-ALBUTEROL 0.5-2.5 (3) MG/3ML IN SOLN
3.0000 mL | Freq: Once | RESPIRATORY_TRACT | Status: AC
  Administered 2022-05-09: 3 mL via RESPIRATORY_TRACT
  Filled 2022-05-09: qty 3

## 2022-05-09 NOTE — ED Provider Notes (Signed)
-----------------------------------------   11:24 PM on 05/09/2022 -----------------------------------------  Assuming care from Dr. Lenard Lance.  In short, Vanessa Logan is a 89 y.o. female with a chief complaint of respiratory distress/failure.  Refer to the original H&P for additional details.  The current plan of care is to follow up on labs and admit to the hospitalist service.  Dr. Lenard Lance confirmed at bedside with the patient's daughter that the patient is DNR/DNI and should not be intubated if she worsens.   Clinical Course as of 05/10/22 0154  Sun May 09, 2022  2358 Labs are back and are generally reassuring.  Respiratory viral panel is negative.  Dr. Lenard Lance feels that this is unlikely to be pneumonia and that the opacity seen on chest x-ray is more consistent with pulmonary edema or atelectasis and I will hold off on empiric antibiotics given the probability that the patient has at least a degree of heart failure in the setting of hypertension as well as her primary issue which is most likely COPD exacerbation.  Consulting the hospitalist for admission. [CF]  Mon May 10, 2022  0022 Consulted with Dr. Para March with the hospitalist service.  We discussed the case and she will admit the patient. [CF]    Clinical Course User Index [CF] Loleta Rose, MD     Medications  ipratropium-albuterol (DUONEB) 0.5-2.5 (3) MG/3ML nebulizer solution 3 mL (3 mLs Nebulization Given 05/09/22 2303)  ipratropium-albuterol (DUONEB) 0.5-2.5 (3) MG/3ML nebulizer solution 3 mL (3 mLs Nebulization Given 05/09/22 2303)  nitroGLYCERIN (NITROGLYN) 2 % ointment 0.5 inch (0.5 inches Topical Given 05/09/22 2317)  hydrALAZINE (APRESOLINE) injection 5 mg (5 mg Intravenous Given 05/09/22 2316)     ED Discharge Orders     None      Final diagnoses:  COPD exacerbation  Acute respiratory failure with hypoxia  Acute pulmonary edema     Loleta Rose, MD 05/10/22 [phone]

## 2022-05-09 NOTE — ED Triage Notes (Signed)
Pt presents to ER via ems from [facility] [place] with c/o resp distress that started at appx 2200.  Pt has hx of COPD and was found initially in the 70's by fire dept on RA.  Pt does not normally wear O2 at baseline.  Ems states pt had wheezing throughout on assessment.  Pt given 125 mg solu-medrol, 2 gram mag, 1 duo neb, and 1 albuterol PTA.  Pt is A&O x3, with hx of dementia.    Pt is a FULL CODE per ems.

## 2022-05-09 NOTE — ED Provider Notes (Addendum)
[facility] Provider Note    Event Date/Time   First MD Initiated Contact with Patient 05/09/22 2245     (approximate)  History   Chief Complaint: Respiratory Distress  HPI  Vanessa Logan is a 89 y.o. female with a past medical history of atrial fibrillation, dementia, hypertension, presents to the emergency department from a nursing facility for shortness of breath.  Per EMS patient is a full code, they were called out to the nursing facility for shortness of breath.  No baseline O2 requirement upon fire department arrival patient satting 70% placed on nonrebreather mask.  Patient received 125 mg of Solu-Medrol, magnesium, DuoNeb and route to the emergency department.  Patient arrives via emergency traffic sitting upright in bed on a nonrebreather.  Patient is able to answer most questions appropriately.  Continues states she is feeling short of breath.  Denies any chest pain.  Per EMS report does have mild dementia at baseline.  Physical Exam   Triage Vital Signs: ED Triage Vitals  Enc Vitals Group     BP      Pulse      Resp      Temp      Temp src      SpO2      Weight      Height      Head Circumference      Peak Flow      Pain Score      Pain Loc      Pain Edu?      Excl. in GC?     Most recent vital signs: There were no vitals filed for this visit.  General: Patient is awake sitting upright in bed mild respiratory distress. CV:  Good peripheral perfusion.  Regular rate and rhythm  Resp:  Tachypnea with increased work of breathing moderate expiratory wheeze bilaterally with decreased air movement bilaterally. Abd:  No distention.  Soft, nontender.  No rebound or guarding. Other:  Minimal lower extremity edema bilaterally.   ED Results / Procedures / Treatments   EKG  EKG viewed and interpreted by myself shows atrial fibrillation around 130 bpm with a narrow QRS, normal axis, largely normal intervals nonspecific ST changes occasional  PVC.  RADIOLOGY  I have seen and evaluated chest x-ray images.  No consolidation seen on my evaluation but possible mild edema. Radiology is read the x-ray is cardiomegaly with mild perihilar edema and small bilateral effusions.  Possible left lower lobe opacity atelectasis versus pneumonia.   MEDICATIONS ORDERED IN ED: Medications  ipratropium-albuterol (DUONEB) 0.5-2.5 (3) MG/3ML nebulizer solution 3 mL (has no administration in time range)  ipratropium-albuterol (DUONEB) 0.5-2.5 (3) MG/3ML nebulizer solution 3 mL (has no administration in time range)     IMPRESSION / MDM / ASSESSMENT AND PLAN / ED COURSE  I reviewed the triage vital signs and the nursing notes.  Patient's presentation is most consistent with acute presentation with potential threat to life or bodily function.  Patient presents emergency department via emergency traffic for respiratory distress.  Per EMS report patient developed respiratory distress at her nursing facility found to be satting 70% on room air with no baseline O2 requirement.  Transported on nonrebreather mask received Solu-Medrol magnesium and DuoNebs prior to arrival.  Upon arrival patient still in mild respiratory distress but able to answer questions appropriately follow commands.  Will place the patient on BiPAP.  Will check labs including cardiac enzymes and a BNP.  Will obtain portable  chest x-ray.  Will dose 2 additional DuoNebs given the patient's wheeze and increased work of breathing.  Chest x-ray shows possible perihilar edema.  Given the patient's shortness of breath perihilar edema and significant hypertension we will dose 0.5 inch of nitroglycerin ointment as well as 5 mg of IV hydralazine to reduce blood pressure and afterload.  Will maintain on BiPAP.  CBC shows no significant findings.  No normal white blood cell count.  Remainder the lab work is pending.  Patient care signed out to oncoming provider.  Patient will admission to the hospital  once her workup is completed.  Is pending  Patient's daughter is here.  I have updated the patient's daughter on the patient's status.  Patient continues to improve and appears much better than upon arrival.  I did have a discussion with the daughter regarding intubation and resuscitation if it were to come to that point.  Daughter states she would not want the patient to be on life support intubated or resuscitated.  I have placed a DNR in the patient's chart.  Besides resuscitation though patient would like full scope of care.   CRITICAL CARE Performed by: Minna Antis   Total critical care time: 30 minutes  Critical care time was exclusive of separately billable procedures and treating other patients.  Critical care was necessary to treat or prevent imminent or life-threatening deterioration.  Critical care was time spent personally by me on the following activities: development of treatment plan with patient and/or surrogate as well as nursing, discussions with consultants, evaluation of patient's response to treatment, examination of patient, obtaining history from patient or surrogate, ordering and performing treatments and interventions, ordering and review of laboratory studies, ordering and review of radiographic studies, pulse oximetry and re-evaluation of patient's condition.   FINAL CLINICAL IMPRESSION(S) / ED DIAGNOSES   Dyspnea Respiratory distress Hypoxia   Note:  This document was prepared using Dragon voice recognition software and may include unintentional dictation errors.   Minna Antis, MD 05/09/22 9563    Minna Antis, MD 05/09/22 [phone]

## 2022-05-10 ENCOUNTER — Other Ambulatory Visit: Payer: Medicare Other

## 2022-05-10 ENCOUNTER — Encounter: Payer: Self-pay | Admitting: Internal Medicine

## 2022-05-10 DIAGNOSIS — J810 Acute pulmonary edema: Secondary | ICD-10-CM | POA: Diagnosis present

## 2022-05-10 DIAGNOSIS — I161 Hypertensive emergency: Secondary | ICD-10-CM | POA: Diagnosis present

## 2022-05-10 DIAGNOSIS — F0390 Unspecified dementia without behavioral disturbance: Secondary | ICD-10-CM

## 2022-05-10 DIAGNOSIS — I509 Heart failure, unspecified: Secondary | ICD-10-CM

## 2022-05-10 DIAGNOSIS — I872 Venous insufficiency (chronic) (peripheral): Secondary | ICD-10-CM | POA: Diagnosis present

## 2022-05-10 DIAGNOSIS — F03A0 Unspecified dementia, mild, without behavioral disturbance, psychotic disturbance, mood disturbance, and anxiety: Secondary | ICD-10-CM | POA: Diagnosis present

## 2022-05-10 DIAGNOSIS — Z1152 Encounter for screening for COVID-19: Secondary | ICD-10-CM | POA: Diagnosis not present

## 2022-05-10 DIAGNOSIS — Z803 Family history of malignant neoplasm of breast: Secondary | ICD-10-CM | POA: Diagnosis not present

## 2022-05-10 DIAGNOSIS — N1831 Chronic kidney disease, stage 3a: Secondary | ICD-10-CM | POA: Diagnosis present

## 2022-05-10 DIAGNOSIS — J9601 Acute respiratory failure with hypoxia: Principal | ICD-10-CM

## 2022-05-10 DIAGNOSIS — E785 Hyperlipidemia, unspecified: Secondary | ICD-10-CM | POA: Diagnosis present

## 2022-05-10 DIAGNOSIS — I081 Rheumatic disorders of both mitral and tricuspid valves: Secondary | ICD-10-CM | POA: Diagnosis present

## 2022-05-10 DIAGNOSIS — Z7901 Long term (current) use of anticoagulants: Secondary | ICD-10-CM | POA: Diagnosis not present

## 2022-05-10 DIAGNOSIS — L89151 Pressure ulcer of sacral region, stage 1: Secondary | ICD-10-CM | POA: Diagnosis present

## 2022-05-10 DIAGNOSIS — I130 Hypertensive heart and chronic kidney disease with heart failure and stage 1 through stage 4 chronic kidney disease, or unspecified chronic kidney disease: Secondary | ICD-10-CM | POA: Diagnosis present

## 2022-05-10 DIAGNOSIS — R55 Syncope and collapse: Secondary | ICD-10-CM | POA: Diagnosis not present

## 2022-05-10 DIAGNOSIS — Z515 Encounter for palliative care: Secondary | ICD-10-CM | POA: Diagnosis not present

## 2022-05-10 DIAGNOSIS — R64 Cachexia: Secondary | ICD-10-CM | POA: Diagnosis present

## 2022-05-10 DIAGNOSIS — E876 Hypokalemia: Secondary | ICD-10-CM | POA: Diagnosis not present

## 2022-05-10 DIAGNOSIS — I5033 Acute on chronic diastolic (congestive) heart failure: Secondary | ICD-10-CM | POA: Diagnosis present

## 2022-05-10 DIAGNOSIS — J441 Chronic obstructive pulmonary disease with (acute) exacerbation: Secondary | ICD-10-CM | POA: Diagnosis present

## 2022-05-10 DIAGNOSIS — I4891 Unspecified atrial fibrillation: Secondary | ICD-10-CM | POA: Diagnosis not present

## 2022-05-10 DIAGNOSIS — E872 Acidosis, unspecified: Secondary | ICD-10-CM | POA: Diagnosis present

## 2022-05-10 DIAGNOSIS — I5031 Acute diastolic (congestive) heart failure: Secondary | ICD-10-CM

## 2022-05-10 DIAGNOSIS — R54 Age-related physical debility: Secondary | ICD-10-CM | POA: Diagnosis present

## 2022-05-10 DIAGNOSIS — E669 Obesity, unspecified: Secondary | ICD-10-CM | POA: Diagnosis present

## 2022-05-10 DIAGNOSIS — J189 Pneumonia, unspecified organism: Secondary | ICD-10-CM | POA: Diagnosis present

## 2022-05-10 DIAGNOSIS — Z66 Do not resuscitate: Secondary | ICD-10-CM | POA: Diagnosis present

## 2022-05-10 DIAGNOSIS — Z79899 Other long term (current) drug therapy: Secondary | ICD-10-CM | POA: Diagnosis not present

## 2022-05-10 DIAGNOSIS — I482 Chronic atrial fibrillation, unspecified: Secondary | ICD-10-CM | POA: Diagnosis present

## 2022-05-10 LAB — BRAIN NATRIURETIC PEPTIDE: B Natriuretic Peptide: 351.4 pg/mL — ABNORMAL HIGH (ref 0.0–100.0)

## 2022-05-10 LAB — TROPONIN I (HIGH SENSITIVITY): Troponin I (High Sensitivity): 12 ng/L (ref <18)

## 2022-05-10 LAB — CBC
HCT: 37.7 % (ref 36.0–46.0)
Hemoglobin: 12.1 g/dL (ref 12.0–15.0)
MCH: 27.6 pg (ref 26.0–34.0)
MCHC: 32.1 g/dL (ref 30.0–36.0)
MCV: 85.9 fL (ref 80.0–100.0)
Platelets: 210 10*3/uL (ref 150–400)
RBC: 4.39 MIL/uL (ref 3.87–5.11)
RDW: 14.9 % (ref 11.5–15.5)
WBC: 12.7 10*3/uL — ABNORMAL HIGH (ref 4.0–10.5)
nRBC: 0 % (ref 0.0–0.2)

## 2022-05-10 LAB — BASIC METABOLIC PANEL
Anion gap: 9 (ref 5–15)
BUN: 17 mg/dL (ref 8–23)
CO2: 27 mmol/L (ref 22–32)
Calcium: 7.8 mg/dL — ABNORMAL LOW (ref 8.9–10.3)
Chloride: 101 mmol/L (ref 98–111)
Creatinine, Ser: 1.01 mg/dL — ABNORMAL HIGH (ref 0.44–1.00)
GFR, Estimated: 52 mL/min — ABNORMAL LOW (ref >60)
Glucose, Bld: 188 mg/dL — ABNORMAL HIGH (ref 70–99)
Potassium: 4.2 mmol/L (ref 3.5–5.1)
Sodium: 137 mmol/L (ref 135–145)

## 2022-05-10 LAB — PROCALCITONIN: Procalcitonin: 0.17 ng/mL

## 2022-05-10 LAB — HIV ANTIBODY (ROUTINE TESTING W REFLEX): HIV Screen 4th Generation wRfx: NONREACTIVE

## 2022-05-10 LAB — LACTIC ACID, PLASMA
Lactic Acid, Venous: 2.6 mmol/L (ref 0.5–1.9)
Lactic Acid, Venous: 2.5 mmol/L (ref 0.5–1.9)

## 2022-05-10 LAB — MRSA NEXT GEN BY PCR, NASAL: MRSA by PCR Next Gen: NOT DETECTED

## 2022-05-10 MED ORDER — FUROSEMIDE 10 MG/ML IJ SOLN
40.0000 mg | Freq: Two times a day (BID) | INTRAMUSCULAR | Status: DC
  Administered 2022-05-10 (×2): 40 mg via INTRAVENOUS
  Filled 2022-05-10 (×2): qty 4

## 2022-05-10 MED ORDER — METOPROLOL SUCCINATE ER 25 MG PO TB24: 12.5000 mg | ORAL_TABLET | Freq: Every day | ORAL | Status: DC

## 2022-05-10 MED ORDER — SODIUM CHLORIDE 0.9 % IV SOLN
500.0000 mg | INTRAVENOUS | Status: DC
  Administered 2022-05-10 – 2022-05-13 (×4): 500 mg via INTRAVENOUS
  Filled 2022-05-10 (×2): qty 500
  Filled 2022-05-10 (×2): qty 5

## 2022-05-10 MED ORDER — LOSARTAN POTASSIUM 50 MG PO TABS: 25.0000 mg | ORAL_TABLET | Freq: Every day | ORAL | Status: DC

## 2022-05-10 MED ORDER — ACETAMINOPHEN 325 MG PO TABS: 650.0000 mg | ORAL_TABLET | ORAL | Status: DC | PRN

## 2022-05-10 MED ORDER — METOPROLOL SUCCINATE ER 25 MG PO TB24
12.5000 mg | ORAL_TABLET | Freq: Two times a day (BID) | ORAL | Status: DC
  Administered 2022-05-11 – 2022-05-12 (×3): 12.5 mg via ORAL
  Filled 2022-05-10 (×5): qty 1

## 2022-05-10 MED ORDER — APIXABAN 5 MG PO TABS
5.0000 mg | ORAL_TABLET | Freq: Two times a day (BID) | ORAL | Status: DC
  Administered 2022-05-10 – 2022-05-13 (×7): 5 mg via ORAL
  Filled 2022-05-10 (×7): qty 1

## 2022-05-10 MED ORDER — ONDANSETRON HCL 4 MG/2ML IJ SOLN: 4.0000 mg | Freq: Four times a day (QID) | INTRAMUSCULAR | Status: DC | PRN

## 2022-05-10 MED ORDER — SODIUM CHLORIDE 0.9 % IV SOLN
2.0000 g | INTRAVENOUS | Status: DC
  Administered 2022-05-10 – 2022-05-12 (×3): 2 g via INTRAVENOUS
  Filled 2022-05-10: qty 20
  Filled 2022-05-10: qty 2
  Filled 2022-05-10: qty 20

## 2022-05-10 MED ORDER — LOSARTAN POTASSIUM 50 MG PO TABS
50.0000 mg | ORAL_TABLET | Freq: Every day | ORAL | Status: DC
  Administered 2022-05-10: 50 mg via ORAL
  Filled 2022-05-10: qty 1

## 2022-05-10 MED ORDER — FUROSEMIDE 10 MG/ML IJ SOLN
40.0000 mg | Freq: Two times a day (BID) | INTRAMUSCULAR | Status: DC
  Administered 2022-05-10: 40 mg via INTRAVENOUS
  Filled 2022-05-10: qty 4

## 2022-05-10 NOTE — ED Notes (Signed)
RN decreased pt's O2 to 4L, will continue to titrate.

## 2022-05-10 NOTE — Assessment & Plan Note (Signed)
Delirium precautions.  Palliative care consultation

## 2022-05-10 NOTE — ED Notes (Signed)
Report given to Ashley, RN

## 2022-05-10 NOTE — Assessment & Plan Note (Addendum)
Suspect related to CHF/hypertensive emergency/rapid A-fib Possible respiratory tract infection (history of multifocal pneumonia and E. coli septicemia 2023) Low suspicion for PE given chronic anticoagulation O2 sats in the 70s on arrival of EMS -Continue BiPAP and wean as tolerated -Treat CHF as outlined above - Follow-up procalcitonin and lactic acid.  Respiratory viral panel was negative - Antitussives if coughing.  Received DuoNebs and Solu-Medrol and route - Will get blood cultures - Given chest x-ray finding of possible infiltrate will cover with ceftriaxone and azithromycin pending procalcitonin/blood culture

## 2022-05-10 NOTE — IPAL (Signed)
  Interdisciplinary Goals of Care Family Meeting   Date carried out: 05/10/2022  Location of the meeting: Phone conference  Member's involved: Physician and Family Member or next of kin, Wells Guiles  Durable Power of Attorney or Environmental health practitioner:  I have reviewed medical records including EPIC notes, labs and imaging, assessed the patient and then met with daughter to discuss major active diagnoses, plan of care, natural trajectory, prognosis, GOC, EOL wishes, disposition and options including Full code/DNI/DNR and the concept of comfort care if DNR is elected. Questions and concerns were addressed. They are in agreement to continue current plan of care . Election for DNR status.     Discussion: We discussed goals of care for Vanessa Logan .    Code status:   Code Status: DNR   Disposition: Continue current acute care  Time spent for the meeting: 17    Andris Baumann, MD  05/10/2022, 1:07 AM

## 2022-05-10 NOTE — Assessment & Plan Note (Addendum)
EKG showed A-fib with a rate of 131 Could be driving acute heart failure or be related to another underlying acute etiology - Treat acute CHF as above and hypoxia - Metoprolol for rate control -Continue Eliquis for stroke prevention

## 2022-05-10 NOTE — Consult Note (Signed)
Consultation Note Date: 05/10/2022 at 1400   Patient Name: Vanessa Logan  DOB: January 05, 1927  MRN: [id number]  Age / Sex: 89 y.o., female  PCP: Vanessa Arbour, MD Referring Physician: Delfino Lovett, MD  Reason for Consultation: Establishing goals of care  HPI/Patient Profile: 89 y.o. female  with past medical history of chronic AF ([facility], mild MR with moderate TR (echo, 2021), HTN, HLD, venous insufficiency, and dementia admitted on 05/09/2022 from home [facility] with respiratory distress.   Patient found to be hypoxic (requiring BiPAP) in A-fib and RVR.   PMT was consulted to discuss goals of care.  Clinical Assessment and Goals of Care: I have reviewed medical records including EPIC notes, labs and imaging, assessed the patient and then met with patient and her daughter Vanessa Logan at bedside to discuss diagnosis prognosis, GOC, EOL wishes, disposition and options.   I introduced Palliative Medicine as specialized medical care for people living with serious illness. It focuses on providing relief from the symptoms and stress of a serious illness. The goal is to improve quality of life for both the patient and the family.  We discussed a brief life review of the patient.  Patient is a widow (approximately 20 years) and is mother of 3 children (2 sons, 1 daughter).  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  Patient is alert and oriented to self.  However, she cannot participate or make medical decision making independently.  As per her advanced directive, patient's next of kin and surrogate decision makers are all of her children.  Symptoms assessed.  Patient on nasal cannula in no respiratory distress.  She endorses that she feels much better and is no longer having the shortness of breath she remembers experiencing last night.  No adjustments to medications or more  at this time.  We discussed patient's current illness and what it means in the larger context of patient's on-going co-morbidities.  A-fib with RVR, use of BiPAP, and MR reviewed.  Natural disease trajectory discussed.  We reviewed importance of nutritional intake once enough time has passed from using BiPAP.  Education provided on BiPAP to patient and daughter.  I attempted to elicit values and goals of care important to the patient.  Patient states she wants to get better so that she can return back to her room at home place at [facility].  Patient's daughter shares she is hopeful that cardiology will continue their workup.  She is hopeful that patient's A-fib can be managed and that patient can return to facility.  Advance directives, concepts specific to code status, artificial feeding and hydration, and rehospitalization were considered and discussed.  Daughter Vanessa Logan shares that son Vanessa Logan confirmed with patient last night that she wants to be a DNR.  DNR aligns with advanced directives on file.  Patient and family would like continued medical workup to treat the treatable during this hospitalization.   Discussed with patient/family the importance of continued conversation with family and the medical  providers regarding overall plan of care and treatment options, ensuring decisions are within the context of the patient's values and GOCs.    Plan is for patient to be admitted and for cardiology to continue workup.  Questions and concerns were addressed. The family was encouraged to call with questions or concerns. PMT will continue to follow.   Primary Decision Maker NEXT OF KIN  Physical Exam Vitals reviewed.  Constitutional:      General: She is not in acute distress.    Appearance: She is normal weight. She is not ill-appearing.  HENT:     Head: Normocephalic.     Mouth/Throat:     Mouth: Mucous membranes are moist.  Eyes:     Pupils: Pupils are equal, round, and reactive to  light.  Cardiovascular:     Rate and Rhythm: Tachycardia present. Rhythm irregular.     Pulses: Normal pulses.  Pulmonary:     Effort: Pulmonary effort is normal.     Comments: [place] Skin:    General: Skin is warm and dry.  Neurological:     Mental Status: She is alert.     Comments: Oriented to self  Psychiatric:        Behavior: Behavior normal.     Palliative Assessment/Data: 40%     Thank you for this consult. Palliative medicine will continue to follow and assist holistically.   Time Total: 75 minutes Greater than 50%  of this time was spent counseling and coordinating care related to the above assessment and plan.  Signed by: Vanessa Cocker, DNP, FNP-BC Palliative Medicine    Please contact Palliative Medicine Team phone at [phone] for questions and concerns.  For individual provider: See Loretha Stapler

## 2022-05-10 NOTE — Final Progress Note (Addendum)
Same day rounding progress note  Patient seen and examined while in the ED.  Waiting for the floor bed.  Please see Dr. Lianne Bushy dictated history and physical for further details.  I agree with her assessment and plan.  Acute on chronic HFpEF Cardiology consult  Acute hypoxic respiratory failure Initially required BiPAP weaned off to 4 L via nasal cannula oxygen now  Hypertensive emergency Improving now  Goals of care Palliative care consult  Time spent: 20 minutes

## 2022-05-10 NOTE — Hospital Course (Signed)
89 y.o. female with medical history significant for HTN, A. fib on Eliquis, dementia, hospitalized a year ago with  multifocal pneumonia and E. coli septicemia, admitted for heart failure exacerbation.  Cardiology was consulted and patient received IV Lasix. Repeat echocardiogram with low normal EF at 50 to 55%, grade 1 diastolic dysfunction, no other significant acute abnormalities.  Patient was able to wean back to room air and feeling much improved. Clinically appears euvolemic.  Lactic acidosis has been resolved.  She also received ceftriaxone and Zithromax for concern of pneumonia.  Being discharged on 2 more days of Augmentin.  Her home HCTZ was discontinued due to softer blood pressure.  Patient can use midodrine as needed if systolic less than 100.  Systolic improved to above 130 on the time of discharge.  Cardiology restarted back her home p.o. Lasix and metoprolol which she will continue.  Patient will continue on current medications.  At baseline she does not walk much and thinks that she is at her normal at this time.  Currently on room air.  Patient need to have a close follow-up with her providers for further recommendations.

## 2022-05-10 NOTE — Assessment & Plan Note (Addendum)
Etiology suspect secondary to hypertensive emergency or rapid A-fib.  Lower suspicion for pneumonia though with finding of lower lobe opacity on chest x-ray.  Low suspicion for acute PE given that patient is on chronic anticoagulation.  Patient was treated for COPD and route but no prior history of same - Continue IV Lasix, continue metoprolol.  Holding HCTZ and losartan -Echocardiogram pending.  Patient had normal echo February 2023 with EF 60 to 65% - Daily weights with intake and output monitoring - Cardiology consult and seen Net IO Since Admission: -3,675 mL [05/11/22 1413]

## 2022-05-10 NOTE — ED Notes (Signed)
Pt removed from BiPap  and placed on [place] at 4L per Dr. Margaretmary Eddy orders. Pt tolerating  well.

## 2022-05-10 NOTE — ED Notes (Signed)
Assumed care of pt, found her in bed alert and oriented.  Pt states her breathing feels better.  She has no complaints at this time.  Pt is dry and comfortable.

## 2022-05-10 NOTE — ED Notes (Signed)
Pt linens, brief, and chuck pad changed. Perineal care was performed and a mepilex applied to pt's backside. Pt is clean and dry at this time. Call bell is within reach, bed in lowest/locked position. Family is at bedside. No other needs voiced at this time.

## 2022-05-10 NOTE — H&P (Addendum)
History and Physical    Patient: Vanessa Logan BWI:[id number] DOB: 1926-07-02 DOA: 05/09/2022 DOS: the patient was seen and examined on 05/10/2022 PCP: Marguarite Arbour, MD  Patient coming from: Home  Chief Complaint:  Chief Complaint  Patient presents with   Respiratory Distress    HPI: Vanessa Logan is a 89 y.o. female with medical history significant for HTN, A. fib on Eliquis, dementia, hospitalized a year ago with  multifocal pneumonia and E. coli septicemia, who was brought via emergency traffic in acute respiratory distress that developed suddenly.  O2 sat with EMS was in the 70s on their arrival and she required NRB for transport.  She was treated with DuoNebs, IV magnesium and Solu-Medrol and route.  Son at bedside gives most of the history and states that she started having lower extremity edema a couple weeks prior.  She has had no cough, chest pain, fever or chills.  She has no prior history of asthma, COPD and has never required inhalers. ED course and data review: On arrival BP 192/111 with pulse 124 and respirations 35, satting at 98% on BiPAP at 60% to which she was transitioned on arrival.  Troponin 10 and BNP 351.  CBC and CMP unremarkable.  Respiratory viral panel negative. EKG, personally viewed and interpreted showing A-fib at 131 with nonspecific ST-T wave changes. Chest x-ray with the following findings "Cardiomegaly with suspected mild perihilar edema and small bilateral pleural effusions.Superimposed left lower lobe opacity, atelectasis versus pneumonia".   Patient given additional DuoNebs and also treated with Nitropaste and hydralazine and hospitalist consulted for admission.   The ED doctor spoke with patient's daughter while in the ED and patient was placed DNR in accordance with her wishes.  Please refer to ED providers note.    Past Medical History:  Diagnosis Date   Atrial fibrillation    Breast screening, unspecified 06/18/11   Diffuse cystic mastopathy     Family history of malignant neoplasm of breast 2013   Obesity, unspecified 2013   Screening for obesity 2013   Special screening for malignant neoplasms, colon 06/21/2011   Unspecified essential hypertension    Past Surgical History:  Procedure Laterality Date   APPENDECTOMY  1950   BREAST BIOPSY Bilateral 1992   neg   BREAST BIOPSY Right 1996   neg   BREAST BIOPSY Right 2005   neg   COLONOSCOPY  2007   COLONOSCOPY  2013   Dr Ricki Rodriguez   COLONOSCOPY W/ POLYPECTOMY  2004   ORIF WRIST FRACTURE Left 04/15/2020   Procedure: OPEN REDUCTION INTERNAL FIXATION LEFT DISTAL RADIUS FRACTION;  Surgeon: Kennedy Bucker, MD;  Location: [facility] ORS;  Service: Orthopedics;  Laterality: Left;   VARICOSE VEIN SURGERY  1983   Social History:  reports that she has never smoked. She has never used smokeless tobacco. She reports that she does not drink alcohol and does not use drugs.  Allergies  Allergen Reactions   Neosporin [Neomycin-Bacitracin Zn-Polymyx] Itching and Swelling   Keflex [Cephalexin] Rash    Family History  Problem Relation Age of Onset   Breast cancer Sister 42   Pancreatic cancer Sister    Colon cancer Daughter    Breast cancer Sister 67    Prior to Admission medications   Medication Sig Start Date End Date Taking? Authorizing Provider  acetaminophen (TYLENOL) 500 MG tablet Take 500 mg by mouth every 6 (six) hours as needed for moderate pain or mild pain.    [provider]  apixaban (ELIQUIS) 5 MG TABS tablet Take 5 mg by mouth 2 (two) times daily.    [provider]  hydrochlorothiazide (HYDRODIURIL) 50 MG tablet Take 50 mg by mouth daily.    [provider]  losartan (COZAAR) 50 MG tablet Take 1 tablet by mouth daily. 08/26/14   [provider]  Magnesium 250 MG TABS Take 250 mg by mouth 2 (two) times daily.    [provider]  metoprolol succinate (TOPROL-XL) 25 MG 24 hr tablet Take 12.5 mg by mouth at bedtime.     [provider]  oxybutynin (DITROPAN-XL) 10 MG 24 hr tablet Take 10 mg by mouth 2 (two) times daily. 08/26/14   [provider]  pantoprazole (PROTONIX) 40 MG tablet Take 1 tablet (40 mg total) by mouth daily. 02/12/21   Lurene Shadow, MD  Potassium Chloride ER 20 MEQ TBCR Take 40 mEq by mouth 2 (two) times daily. 11/15/20   [provider]    Physical Exam: Vitals:   05/09/22 2250 05/09/22 2300 05/09/22 2310  BP: (!) 192/111 (!) 162/118 (!) 155/131  Pulse: (!) 124 (!) 107 (!) 118  Resp: (!) 35 (!) 34 (!) 32  Temp: 98.3 F (36.8 C)    TempSrc: Oral    SpO2: 98%  93%   Physical Exam Vitals and nursing note reviewed.  Constitutional:      General: She is not in acute distress.    Comments: Frail elderly female with BiPAP mask on  HENT:     Head: Normocephalic and atraumatic.  Cardiovascular:     Rate and Rhythm: Tachycardia present. Rhythm irregular.     Heart sounds: Normal heart sounds.  Pulmonary:     Effort: Tachypnea present.     Breath sounds: Normal breath sounds.  Abdominal:     Palpations: Abdomen is soft.     Tenderness: There is no abdominal tenderness.  Musculoskeletal:     Right lower leg: Edema present.     Left lower leg: Edema present.  Neurological:     General: No focal deficit present.     Mental Status: Mental status is at baseline.     Labs on Admission: I have personally reviewed following labs and imaging studies  CBC: Recent Labs  Lab 05/09/22 2246  WBC 9.7  HGB 14.0  HCT 45.1  MCV 86.1  PLT 282   Basic Metabolic Panel: Recent Labs  Lab 05/09/22 2246  NA 136  K 3.8  CL 103  CO2 24  GLUCOSE 119*  BUN 17  CREATININE 1.07*  CALCIUM 8.1*   GFR: CrCl cannot be calculated (Unknown ideal weight.). Liver Function Tests: Recent Labs  Lab 05/09/22 2246  AST 27  ALT 16  ALKPHOS 127*  BILITOT 1.2  PROT 6.5  ALBUMIN 2.7*   No results for input(s): "LIPASE", "AMYLASE" in the last 168 hours. No results for input(s): "AMMONIA"  in the last 168 hours. Coagulation Profile: No results for input(s): "INR", "PROTIME" in the last 168 hours. Cardiac Enzymes: No results for input(s): "CKTOTAL", "CKMB", "CKMBINDEX", "TROPONINI" in the last 168 hours. BNP (last 3 results) No results for input(s): "PROBNP" in the last 8760 hours. HbA1C: No results for input(s): "HGBA1C" in the last 72 hours. CBG: No results for input(s): "GLUCAP" in the last 168 hours. Lipid Profile: No results for input(s): "CHOL", "HDL", "LDLCALC", "TRIG", "CHOLHDL", "LDLDIRECT" in the last 72 hours. Thyroid Function Tests: No results for input(s): "TSH", "T4TOTAL", "FREET4", "T3FREE", "THYROIDAB" in the last  72 hours. Anemia Panel: No results for input(s): "VITAMINB12", "FOLATE", "FERRITIN", "TIBC", "IRON", "RETICCTPCT" in the last 72 hours. Urine analysis:    Component Value Date/Time   COLORURINE YELLOW (A) 02/04/2021 0541   APPEARANCEUR CLEAR (A) 02/04/2021 0541   LABSPEC 1.008 02/04/2021 0541   PHURINE 5.0 02/04/2021 0541   GLUCOSEU NEGATIVE 02/04/2021 0541   HGBUR MODERATE (A) 02/04/2021 0541   BILIRUBINUR NEGATIVE 02/04/2021 0541   KETONESUR NEGATIVE 02/04/2021 0541   PROTEINUR NEGATIVE 02/04/2021 0541   NITRITE NEGATIVE 02/04/2021 0541   LEUKOCYTESUR MODERATE (A) 02/04/2021 0541    Radiological Exams on Admission: DG Chest Portable 1 View  Result Date: 05/09/2022 CLINICAL DATA:  Shortness of breath, respiratory distress EXAM: PORTABLE CHEST 1 VIEW COMPARISON:  CT chest dated 02/25/2021 FINDINGS: Cardiomegaly with suspected mild perihilar edema and small bilateral pleural effusions. Superimposed left lower lobe opacity, atelectasis versus pneumonia. No pneumothorax. IMPRESSION: Cardiomegaly with suspected mild perihilar edema and small bilateral pleural effusions. Superimposed left lower lobe opacity, atelectasis versus pneumonia. Electronically Signed   By: Charline Bills M.D.   On: 05/09/2022 23:04     Data Reviewed: Relevant  notes from primary care and specialist visits, past discharge summaries as available in EHR, including Care Everywhere. Prior diagnostic testing as pertinent to current admission diagnoses Updated medications and problem lists for reconciliation ED course, including vitals, labs, imaging, treatment and response to treatment Triage notes, nursing and pharmacy notes and ED provider's notes Notable results as noted in HPI   Assessment and Plan: * Acute heart failure Etiology suspect secondary to hypertensive emergency or rapid A-fib.  Lower suspicion for pneumonia though with finding of lower lobe opacity on chest x-ray.  Low suspicion for acute PE given that patient is on chronic anticoagulation.  Patient was treated for COPD and route but no prior history of same - IV Lasix, continue Nitropaste, as needed IV hydralazine - Will get procalcitonin and lactic acid to evaluate for possible pneumonia given history of multifocal pneumonia with E. coli septicemia year ago  -Echocardiogram in the a.m.  Patient had normal echo February 2023 with EF 60 to 65% - Daily weights with intake and output monitoring -Continue BiPAP and wean as tolerated.  Patient has no baseline O2 requirement  Rapid atrial fibrillation EKG showed A-fib with a rate of 131 Could be driving acute heart failure or be related to another underlying acute etiology - Treat acute CHF as above and hypoxia - Diltiazem infusion if needed for rate control -Continue Eliquis for stroke prevention  Hypertensive emergency SBP in the 190s on arrival Continue Lasix, Nitropaste with as needed hydralazine and continue home metoprolol  Acute respiratory failure with hypoxia Suspect related to CHF/hypertensive emergency/rapid A-fib Possible respiratory tract infection (history of multifocal pneumonia and E. coli septicemia 2023) Low suspicion for PE given chronic anticoagulation O2 sats in the 70s on arrival of EMS -Continue BiPAP and wean  as tolerated -Treat CHF as outlined above - Follow-up procalcitonin and lactic acid.  Respiratory viral panel was negative.Marland KitchenMarland KitchenAddendum: Lactic acid 2.6>2.5, likely due to resp distress, not sepsis - Antitussives if coughing.  Received DuoNebs and Solu-Medrol and route - Will get blood cultures - Given chest x-ray finding of possible infiltrate will cover with ceftriaxone and azithromycin pending procalcitonin/blood culture  Stage 3a chronic kidney disease Renal function at baseline  Dementia without behavioral disturbance Delirium precautions  CRITICAL CARE Performed by: Andris Baumann   Total critical care time: 70 minutes  Critical care time was exclusive  of separately billable procedures and treating other patients.  Critical care was necessary to treat or prevent imminent or life-threatening deterioration.  Critical care was time spent personally by me on the following activities: development of treatment plan with patient and/or surrogate as well as nursing, discussions with consultants, evaluation of patient's response to treatment, examination of patient, obtaining history from patient or surrogate, ordering and performing treatments and interventions, ordering and review of laboratory studies, ordering and review of radiographic studies, pulse oximetry and re-evaluation of patient's condition.   DVT prophylaxis: Eliquis  Consults: Cardiology, Dr Duke Salvia, [facility]  Advance Care Planning:   Code Status: DNR   Family Communication: Son at bedside  Disposition Plan: Back to previous home environment  Severity of Illness: The appropriate patient status for this patient is INPATIENT. Inpatient status is judged to be reasonable and necessary in order to provide the required intensity of service to ensure the patient's safety. The patient's presenting symptoms, physical exam findings, and initial radiographic and laboratory data in the context of their chronic comorbidities is felt  to place them at high risk for further clinical deterioration. Furthermore, it is not anticipated that the patient will be medically stable for discharge from the hospital within 2 midnights of admission.   * I certify that at the point of admission it is my clinical judgment that the patient will require inpatient hospital care spanning beyond 2 midnights from the point of admission due to high intensity of service, high risk for further deterioration and high frequency of surveillance required.*  Author: Andris Baumann, MD 05/10/2022 12:42 AM  For on call review [email or web address].

## 2022-05-10 NOTE — Assessment & Plan Note (Signed)
Renal function at baseline 

## 2022-05-10 NOTE — Assessment & Plan Note (Signed)
SBP in the 190s on arrival Continue Lasix, Nitropaste with as needed hydralazine and continue home metoprolol

## 2022-05-10 NOTE — ED Notes (Signed)
This RN entered pt's room and found the main tube to the pt's BiPap machine was disconnected from the pt's mask, the tube was reconnected. The small clear tube was disconnected in the process of reconnecting the large tube and was also reconnected. The BiPap machine was not alarming to alert staff of the disconnection. This RN called RT to verify that the tubes were reconnected correctly and it was confirmed that the tubes were in the right place. RT was asked to come check the pt to make sure the BiPap mask was on the pt correctly and that it was comfortable and to also verify the alarm settings. RT stated they will come by to verify settings. PT has call bell within reach, bed in lowest/locked position, and pt verbalized no additional needs at this time.

## 2022-05-10 NOTE — Consult Note (Signed)
[facility] CLINIC CARDIOLOGY CONSULT NOTE       Patient ID: Vanessa Logan MRN: [id number] DOB/AGE: 87-03-1926 87 y.o.  Admit date: 05/09/2022 Referring Physician Dr. Lindajo Royal Primary Physician Dr. Judithann Sheen  Primary Cardiologist Dr. Darrold Junker Reason for Consultation Acute CHF   HPI: Vanessa Logan is a 89yoF with a PMH of chronic AF (eliquis), mild MR & moderate TR by echo (2021), HTN, HLD, venous insufficiency, dementia who presented to [facility] ED from a nursing facility on 05/09/2022 with 2 weeks of worsening peripheral edema refractory to outpatient diuretics, with sudden respiratory distress on evening of admission. Hypoxic to 70% on RA when EMS arrived, requiring BIPAP in the ED. In AF RVR on tele with initial rate in the 130s. Cardiology is consulted for further assistance.   History is obtained entirely from the patient's chart as the patient is alert and oriented to self only and there is no family present at bedside.  It appears patient has had 4 visits since mid March to her primary care provider's office for worsening peripheral edema.  There is documentation of weeping from her lower extremity wounds.  She was initially prescribed torsemide 20 mg once a day for 3 days in addition to a 10-day course of doxycycline for treatment of her peripheral edema and lower extremity cellulitis.  On 4/9 she returned for a follow-up visit where she was prescribed Lasix 40 mg once daily for peripheral edema.  She presented to [facility] ED the evening of 4/14 in respiratory distress, reportedly hypoxic to 70% on room air and placed on a nonrebreather.  On route via EMS she was given 125 mg of Solu-Medrol, magnesium, and a DuoNeb treatment.  She was initially hypertensive to 192/111, and chest x-ray with pulmonary edema.  Subsequently was treated with half inch of Nitropaste, 2 doses of IV Lasix 40 mg, and BiPAP overnight with improvement in her oxygen saturations.  At my time of evaluation this afternoon the patient  is sitting upright in the ED stretcher on 4 L of oxygen by nasal cannula.  She tells me "I am getting ready to go home."  She is alert and oriented to her first and last name, but not time, place, or situation.  She admits to a sore throat, but denies chest pain or dyspnea.  She thinks her leg swelling has much improved.   Review of systems limited by patient's baseline dementia    Past Medical History:  Diagnosis Date   Atrial fibrillation    Breast screening, unspecified 06/18/11   Diffuse cystic mastopathy    Family history of malignant neoplasm of breast 2013   Obesity, unspecified 2013   Screening for obesity 2013   Special screening for malignant neoplasms, colon 06/21/2011   Unspecified essential hypertension     Past Surgical History:  Procedure Laterality Date   APPENDECTOMY  1950   BREAST BIOPSY Bilateral 1992   neg   BREAST BIOPSY Right 1996   neg   BREAST BIOPSY Right 2005   neg   COLONOSCOPY  2007   COLONOSCOPY  2013   Dr Ricki Rodriguez   COLONOSCOPY W/ POLYPECTOMY  2004   ORIF WRIST FRACTURE Left 04/15/2020   Procedure: OPEN REDUCTION INTERNAL FIXATION LEFT DISTAL RADIUS FRACTION;  Surgeon: Kennedy Bucker, MD;  Location: [facility] ORS;  Service: Orthopedics;  Laterality: Left;   VARICOSE VEIN SURGERY  1983    (Not in a hospital admission)  Social History   Socioeconomic History   Marital status: Widowed  Spouse name: Not on file   Number of children: Not on file   Years of education: Not on file   Highest education level: Not on file  Occupational History   Not on file  Tobacco Use   Smoking status: Never   Smokeless tobacco: Never  Substance and Sexual Activity   Alcohol use: No    Alcohol/week: 0.0 standard drinks of alcohol   Drug use: No   Sexual activity: Not on file  Other Topics Concern   Not on file  Social History Narrative   Not on file   Social Determinants of Health   Financial Resource Strain: Not on file  Food Insecurity: No Food Insecurity  (05/10/2022)   Hunger Vital Sign    Worried About Running Out of Food in the Last Year: Never true    Ran Out of Food in the Last Year: Never true  Transportation Needs: No Transportation Needs (05/10/2022)   PRAPARE - Administrator, Civil Service (Medical): No    Lack of Transportation (Non-Medical): No  Physical Activity: Not on file  Stress: Not on file  Social Connections: Not on file  Intimate Partner Violence: Not At Risk (05/10/2022)   Humiliation, Afraid, Rape, and Kick questionnaire    Fear of Current or Ex-Partner: No    Emotionally Abused: No    Physically Abused: No    Sexually Abused: No    Family History  Problem Relation Age of Onset   Breast cancer Sister 51   Pancreatic cancer Sister    Colon cancer Daughter    Breast cancer Sister 4      Intake/Output Summary (Last 24 hours) at 05/10/2022 1229 Last data filed at 05/10/2022 0603 Gross per 24 hour  Intake 350 ml  Output 700 ml  Net -350 ml    Vitals:   05/10/22 1100 05/10/22 1130 05/10/22 1200 05/10/22 1201  BP: 118/85 127/86 (!) 129/113   Pulse:  94 88   Resp: 19 (!) 21 (!) 27   Temp:    97.9 F (36.6 C)  TempSrc:    Axillary  SpO2:  96% 94%     PHYSICAL EXAM General: Elderly and frail Caucasian female, in no acute distress.  Sitting upright in ED stretcher without family at bedside. HEENT:  Normocephalic and atraumatic. Neck:  No JVD.  Lungs: Normal respiratory effort on 4L by [place].  Bibasilar crackles without appreciable wheezes.   Heart: Irregularly irregular with controlled rate. Normal S1 and S2 without gallops or murmurs.  Abdomen: Non-distended appearing.  Msk: Normal strength and tone for age. Extremities: Generalized erythema to bilateral lower extremities with skin wrinkling and dimpling without significant edema.  Left shin with dime sized chronic appearing ulcer with dried blood. Neuro: Alert and oriented to self only.  Psych:  Answers simple questions appropriately.    Labs: Basic Metabolic Panel: Recent Labs    05/09/22 2246 05/10/22 0455  NA 136 137  K 3.8 4.2  CL 103 101  CO2 24 27  GLUCOSE 119* 188*  BUN 17 17  CREATININE 1.07* 1.01*  CALCIUM 8.1* 7.8*   Liver Function Tests: Recent Labs    05/09/22 2246  AST 27  ALT 16  ALKPHOS 127*  BILITOT 1.2  PROT 6.5  ALBUMIN 2.7*   No results for input(s): "LIPASE", "AMYLASE" in the last 72 hours. CBC: Recent Labs    05/09/22 2246 05/10/22 0455  WBC 9.7 12.7*  HGB 14.0 12.1  HCT 45.1 37.7  MCV 86.1 85.9  PLT 282 210   Cardiac Enzymes: Recent Labs    05/09/22 2246 05/10/22 0059  TROPONINIHS 10 12   BNP: Recent Labs    05/09/22 2246  BNP 351.4*   D-Dimer: No results for input(s): "DDIMER" in the last 72 hours. Hemoglobin A1C: No results for input(s): "HGBA1C" in the last 72 hours. Fasting Lipid Panel: No results for input(s): "CHOL", "HDL", "LDLCALC", "TRIG", "CHOLHDL", "LDLDIRECT" in the last 72 hours. Thyroid Function Tests: No results for input(s): "TSH", "T4TOTAL", "T3FREE", "THYROIDAB" in the last 72 hours.  Invalid input(s): "FREET3" Anemia Panel: No results for input(s): "VITAMINB12", "FOLATE", "FERRITIN", "TIBC", "IRON", "RETICCTPCT" in the last 72 hours.   Radiology: DG Chest Portable 1 View  Result Date: 05/09/2022 CLINICAL DATA:  Shortness of breath, respiratory distress EXAM: PORTABLE CHEST 1 VIEW COMPARISON:  CT chest dated 02/25/2021 FINDINGS: Cardiomegaly with suspected mild perihilar edema and small bilateral pleural effusions. Superimposed left lower lobe opacity, atelectasis versus pneumonia. No pneumothorax. IMPRESSION: Cardiomegaly with suspected mild perihilar edema and small bilateral pleural effusions. Superimposed left lower lobe opacity, atelectasis versus pneumonia. Electronically Signed   By: Charline Bills M.D.   On: 05/09/2022 23:04    ECHO 02/01/2019 NORMAL LEFT VENTRICULAR SYSTOLIC FUNCTION  NORMAL RIGHT VENTRICULAR SYSTOLIC FUNCTION   MILD VALVULAR REGURGITATION (See above)  NO VALVULAR STENOSIS  MILD MR, MOD TR  TRIVIAL PR  EF >55%   TELEMETRY reviewed by me (LT) 05/10/2022 : Atrial fibrillation initially rates 110s 120s, improved this afternoon to high 90s, low 100s.  EKG reviewed by me: AF PVC 131  Data reviewed by me (LT) 05/10/2022: Last PCP note, last outpatient cardiology note, ED notes, admission H&P last 24h vitals tele labs imaging I/O   Principal Problem:   Acute heart failure Active Problems:   Rapid atrial fibrillation   Dementia without behavioral disturbance   Acute respiratory failure with hypoxia   Hypertensive emergency   Stage 3a chronic kidney disease    ASSESSMENT AND PLAN:  Vanessa Logan is a 89yoF with a PMH of chronic AF (eliquis), mild MR & moderate TR by echo (2021), HTN, HLD, venous insufficiency, dementia who presented to [place] ED from a nursing facility on 05/09/2022 with 2 weeks of worsening peripheral edema refractory to outpatient diuretics, with sudden respiratory distress on evening of admission. Hypoxic to 70% on RA when EMS arrived, requiring BIPAP in the ED. In AF RVR on tele with initial rate in the 130s. Cardiology is consulted for further assistance.   # Acute hypoxic respiratory failure # Hypertensive emergency # Acute on chronic HFpEF Presents with at least 2 weeks of peripheral edema and weeping from a wound on her lower extremities, initially treated with torsemide and Lasix by her PCP outpatient.  EMS was called out to her nursing facility for respiratory distress, initially hypoxic to 70% on room air, requiring BiPAP overnight.  Initially significantly hypertensive to 190/100 and chest x-ray with pulmonary edema BNP borderline elevated to 351.  Started on empiric ceftriaxone and azithromycin for CAP coverage.  Clinically improved after initial diuresis, now weaned to 4L by [place], BP also normalized. -Continue Lasix 40 mg IV twice daily for now -Stop home HCTZ -Increase  metoprolol XL to 12.5 mg twice daily (home dosing) -Continue losartan 50 mg daily -Monitor renal function and electrolytes daily with BMP -Echo complete pending -Monitor I's/O  #?  Chronic AF with RVR Documented history of AF on every EKG dating back to 03/2020. Rate in the  120s-130s initially, rate and respiratory status have improved this afternoon.  Currently in the 90s-low 100s. -Increase metoprolol XL 12.5 mg to twice daily dosing -Continue Eliquis 5 mg twice daily for stroke prevention.  This is the correct dose with her weight >60 kg and serum creatinine <1.5.  This patient's plan of care was discussed and created with Dr. Juliann Pares and he is in agreement.  Signed: Rebeca Allegra , PA-C 05/10/2022, 12:29 PM [facility] Cardiology

## 2022-05-11 ENCOUNTER — Inpatient Hospital Stay
Admit: 2022-05-11 | Discharge: 2022-05-11 | Disposition: A | Discharge status: Home / Self Care | Payer: Medicare Other | Attending: Cardiology | Admitting: Cardiology

## 2022-05-11 DIAGNOSIS — I4891 Unspecified atrial fibrillation: Secondary | ICD-10-CM | POA: Diagnosis not present

## 2022-05-11 DIAGNOSIS — E876 Hypokalemia: Secondary | ICD-10-CM | POA: Diagnosis not present

## 2022-05-11 DIAGNOSIS — L8990 Pressure ulcer of unspecified site, unspecified stage: Secondary | ICD-10-CM | POA: Diagnosis present

## 2022-05-11 DIAGNOSIS — N1831 Chronic kidney disease, stage 3a: Secondary | ICD-10-CM

## 2022-05-11 DIAGNOSIS — J810 Acute pulmonary edema: Secondary | ICD-10-CM | POA: Diagnosis not present

## 2022-05-11 DIAGNOSIS — I5031 Acute diastolic (congestive) heart failure: Secondary | ICD-10-CM | POA: Diagnosis not present

## 2022-05-11 DIAGNOSIS — J441 Chronic obstructive pulmonary disease with (acute) exacerbation: Secondary | ICD-10-CM | POA: Diagnosis not present

## 2022-05-11 DIAGNOSIS — F0390 Unspecified dementia without behavioral disturbance: Secondary | ICD-10-CM | POA: Diagnosis not present

## 2022-05-11 DIAGNOSIS — J9601 Acute respiratory failure with hypoxia: Secondary | ICD-10-CM | POA: Diagnosis not present

## 2022-05-11 LAB — ECHOCARDIOGRAM COMPLETE
AR max vel: 2.19 cm2
AV Area VTI: 2.39 cm2
AV Area mean vel: 2.04 cm2
AV Mean grad: 3.3 mmHg
AV Peak grad: 6.5 mmHg
Ao pk vel: 1.28 m/s
Area-P 1/2: 3.96 cm2
MV VTI: 1.99 cm2
S' Lateral: 2.6 cm

## 2022-05-11 LAB — CBC
HCT: 36.4 % (ref 36.0–46.0)
Hemoglobin: 11.6 g/dL — ABNORMAL LOW (ref 12.0–15.0)
MCH: 27.2 pg (ref 26.0–34.0)
MCHC: 31.9 g/dL (ref 30.0–36.0)
MCV: 85.2 fL (ref 80.0–100.0)
Platelets: 221 10*3/uL (ref 150–400)
RBC: 4.27 MIL/uL (ref 3.87–5.11)
RDW: 14.6 % (ref 11.5–15.5)
WBC: 10.6 10*3/uL — ABNORMAL HIGH (ref 4.0–10.5)
nRBC: 0 % (ref 0.0–0.2)

## 2022-05-11 LAB — BASIC METABOLIC PANEL
Anion gap: 8 (ref 5–15)
BUN: 20 mg/dL (ref 8–23)
CO2: 34 mmol/L — ABNORMAL HIGH (ref 22–32)
Calcium: 7.6 mg/dL — ABNORMAL LOW (ref 8.9–10.3)
Chloride: 97 mmol/L — ABNORMAL LOW (ref 98–111)
Creatinine, Ser: 0.93 mg/dL (ref 0.44–1.00)
GFR, Estimated: 57 mL/min — ABNORMAL LOW (ref >60)
Glucose, Bld: 91 mg/dL (ref 70–99)
Potassium: 2.9 mmol/L — ABNORMAL LOW (ref 3.5–5.1)
Sodium: 139 mmol/L (ref 135–145)

## 2022-05-11 LAB — CULTURE, BLOOD (ROUTINE X 2): Culture: NO GROWTH

## 2022-05-11 LAB — LACTIC ACID, PLASMA
Lactic Acid, Venous: 3.5 mmol/L (ref 0.5–1.9)
Lactic Acid, Venous: 2.9 mmol/L (ref 0.5–1.9)

## 2022-05-11 MED ORDER — POTASSIUM CHLORIDE CRYS ER 20 MEQ PO TBCR
40.0000 meq | EXTENDED_RELEASE_TABLET | Freq: Once | ORAL | Status: AC
  Administered 2022-05-11: 40 meq via ORAL
  Filled 2022-05-11: qty 2

## 2022-05-11 MED ORDER — MIDODRINE HCL 5 MG PO TABS: 5.0000 mg | ORAL_TABLET | Freq: Three times a day (TID) | ORAL | Status: DC

## 2022-05-11 MED ORDER — POTASSIUM CHLORIDE CRYS ER 20 MEQ PO TBCR: 40.0000 meq | EXTENDED_RELEASE_TABLET | Freq: Once | ORAL | Status: DC

## 2022-05-11 MED ORDER — FUROSEMIDE 10 MG/ML IJ SOLN
40.0000 mg | Freq: Once | INTRAMUSCULAR | Status: AC
  Administered 2022-05-11: 40 mg via INTRAVENOUS
  Filled 2022-05-11: qty 4

## 2022-05-11 MED ORDER — MELATONIN 5 MG PO TABS
5.0000 mg | ORAL_TABLET | Freq: Once | ORAL | Status: AC
  Administered 2022-05-11: 5 mg via ORAL
  Filled 2022-05-11: qty 1

## 2022-05-11 MED ORDER — MIDODRINE HCL 5 MG PO TABS: 5.0000 mg | ORAL_TABLET | Freq: Three times a day (TID) | ORAL | Status: DC | PRN

## 2022-05-11 NOTE — Progress Notes (Signed)
  Progress Note   Patient: Vanessa Logan ZOX:[id number] DOB: 1926-11-28 DOA: 05/09/2022     1 DOS: the patient was seen and examined on 05/11/2022   Brief hospital course: 89 y.o. female with medical history significant for HTN, A. fib on Eliquis, dementia, hospitalized a year ago with  multifocal pneumonia and E. coli septicemia, admitted for heart failure exacerbation  4/16: Cardiology consult, echo  Assessment and Plan: * Acute heart failure Etiology suspect secondary to hypertensive emergency or rapid A-fib.  Lower suspicion for pneumonia though with finding of lower lobe opacity on chest x-ray.  Low suspicion for acute PE given that patient is on chronic anticoagulation.  Patient was treated for COPD and route but no prior history of same - Continue IV Lasix, continue metoprolol.  Holding HCTZ and losartan -Echocardiogram pending.  Patient had normal echo February 2023 with EF 60 to 65% - Daily weights with intake and output monitoring - Cardiology consult and seen Net IO Since Admission: -3,675 mL [05/11/22 1413]   Rapid atrial fibrillation EKG showed A-fib with a rate of 131 Could be driving acute heart failure or be related to another underlying acute etiology - Treat acute CHF as above and hypoxia - Metoprolol for rate control -Continue Eliquis for stroke prevention  Hypertensive emergency SBP in the 190s on arrival but now much better controlled Continue Lasix, and metoprolol  Acute respiratory failure with hypoxia Suspect related to CHF/hypertensive emergency/rapid A-fib Possible respiratory tract infection (history of multifocal pneumonia and E. coli septicemia 2023) Low suspicion for PE given chronic anticoagulation O2 sats in the 70s on arrival of EMS - Required BiPAP on admission but now weaned off to 5 L nasal cannula oxygen -Treat CHF as outlined above - procalcitonin 0.17 and lactic acid trending down.  Respiratory viral panel was negative - Antitussives if  coughing.  - Given chest x-ray finding of possible infiltrate will cover with ceftriaxone and azithromycin  Pressure injury of skin On left shin-dime sized chronic appearing ulcer with dried blood.  Will consult wound care nurse  Hypokalemia Replete and recheck, likely this is due to diuresis  Stage 3a chronic kidney disease Renal function at baseline.  Dementia without behavioral disturbance Delirium precautions.  Palliative care consultation        Subjective: Slowly improving.  Still requiring 5 L oxygen by nasal cannula wants to eat  Physical Exam: Vitals:   05/11/22 1017 05/11/22 1140 05/11/22 1324 05/11/22 1329  BP: 96/66  104/74   Pulse:   84   Resp: (!) 23  18   Temp:  97.7 F (36.5 C) 97.9 F (36.6 C)   TempSrc:  Oral Oral   SpO2:   100%   Weight:    69.2 kg  Height:     (1.16 m)   89 year old frail and cachectic female lying in the bed comfortably without any acute distress Lungs crackles at the bases. Heart irregularly irregular heart sounds Abdomen soft, benign Neuro alert and awake, nonfocal Extremities: Erythema to bilateral lower extremity.  Trace edema right more than the left.  Left shin with chronic ulcer and dried blood Psych normal mood and affect Data Reviewed:  Potassium 2.9  Family Communication: Daughter updated at bedside  Disposition: Status is: Inpatient Remains inpatient appropriate because: Management of heart failure  Planned Discharge Destination: Home with Home Health   DVT prophylaxis-Eliquis Time spent: 35 minutes  Author: Delfino Lovett, MD 05/11/2022 2:18 PM  For on call review [email or web address].

## 2022-05-11 NOTE — ED Notes (Signed)
Patient given a breakfast tray.

## 2022-05-11 NOTE — Progress Notes (Signed)
[facility] CLINIC CARDIOLOGY CONSULT NOTE       Patient ID: Vanessa Logan MRN: [id number] DOB/AGE: 29-Mar-1926 89 y.o.  Admit date: 05/09/2022 Referring Physician Dr. Lindajo Royal Primary Physician Dr. Judithann Sheen  Primary Cardiologist Dr. Darrold Junker Reason for Consultation Acute CHF   HPI: Vanessa Logan is a 89yoF with a PMH of chronic AF (eliquis), mild MR & moderate TR by echo (2021), HTN, HLD, venous insufficiency, dementia who presented to [facility] ED from a nursing facility on 05/09/2022 with 2 weeks of worsening peripheral edema refractory to outpatient diuretics, with sudden respiratory distress on evening of admission. Hypoxic to 70% on RA when EMS arrived, requiring BIPAP in the ED. In AF RVR on tele with initial rate in the 130s. Cardiology is consulted for further assistance.   Interval History:  - more alert and oriented today. Knows her name, "Vanessa Logan" and that she is here because she is sick - no chest pain, breathing much improved. Still on 5L [place] but waveform is poor - peripheral edema much improved. Diuresed net neg 3.4L overnight  - Cr also downtrending from 1.07 -- 1.01 -- 0.93 - echo pending  Review of systems limited by patient's baseline dementia    Past Medical History:  Diagnosis Date   Atrial fibrillation    Breast screening, unspecified 06/18/11   Diffuse cystic mastopathy    Family history of malignant neoplasm of breast 2013   Obesity, unspecified 2013   Screening for obesity 2013   Special screening for malignant neoplasms, colon 06/21/2011   Unspecified essential hypertension     Past Surgical History:  Procedure Laterality Date   APPENDECTOMY  1950   BREAST BIOPSY Bilateral 1992   neg   BREAST BIOPSY Right 1996   neg   BREAST BIOPSY Right 2005   neg   COLONOSCOPY  2007   COLONOSCOPY  2013   Dr Ricki Rodriguez   COLONOSCOPY W/ POLYPECTOMY  2004   ORIF WRIST FRACTURE Left 04/15/2020   Procedure: OPEN REDUCTION INTERNAL FIXATION LEFT DISTAL RADIUS FRACTION;   Surgeon: Kennedy Bucker, MD;  Location: [facility] ORS;  Service: Orthopedics;  Laterality: Left;   VARICOSE VEIN SURGERY  1983    (Not in a hospital admission)  Social History   Socioeconomic History   Marital status: Widowed    Spouse name: Not on file   Number of children: Not on file   Years of education: Not on file   Highest education level: Not on file  Occupational History   Not on file  Tobacco Use   Smoking status: Never   Smokeless tobacco: Never  Substance and Sexual Activity   Alcohol use: No    Alcohol/week: 0.0 standard drinks of alcohol   Drug use: No   Sexual activity: Not on file  Other Topics Concern   Not on file  Social History Narrative   Not on file   Social Determinants of Health   Financial Resource Strain: Not on file  Food Insecurity: No Food Insecurity (05/10/2022)   Hunger Vital Sign    Worried About Running Out of Food in the Last Year: Never true    Ran Out of Food in the Last Year: Never true  Transportation Needs: No Transportation Needs (05/10/2022)   PRAPARE - Administrator, Civil Service (Medical): No    Lack of Transportation (Non-Medical): No  Physical Activity: Not on file  Stress: Not on file  Social Connections: Not on file  Intimate Partner Violence: Not At  Risk (05/10/2022)   Humiliation, Afraid, Rape, and Kick questionnaire    Fear of Current or Ex-Partner: No    Emotionally Abused: No    Physically Abused: No    Sexually Abused: No    Family History  Problem Relation Age of Onset   Breast cancer Sister 54   Pancreatic cancer Sister    Colon cancer Daughter    Breast cancer Sister 109      Intake/Output Summary (Last 24 hours) at 05/11/2022 0842 Last data filed at 05/11/2022 0249 Gross per 24 hour  Intake --  Output 3325 ml  Net -3325 ml     Vitals:   05/11/22 0530 05/11/22 0600 05/11/22 0630 05/11/22 0733  BP: 90/65 (!) 108/58 99/62   Pulse: 91  (!) 36   Resp: Temp:    (!) 97.4 F (36.3 C)   TempSrc:    Oral  SpO2: 94%  (!) 80%     PHYSICAL EXAM General: Elderly and frail Caucasian female, in no acute distress.  Sitting upright in ED stretcher eating breakfast HEENT:  Normocephalic and atraumatic. Neck:  No JVD.  Lungs: Normal respiratory effort on 5L by [place].  Decreased breath sounds with trace bibasilar crackles, improved from yesterday Heart: Irregularly irregular with controlled rate. Normal S1 and S2 without gallops or murmurs.  Abdomen: Non-distended appearing.  Msk: Normal strength and tone for age. Extremities: warm to touch. Generalized erythema to bilateral lower extremities with skin wrinkling and dimpling with trace edema R > L.  Left shin with dime sized chronic appearing ulcer with dried blood. Neuro: Alert and oriented to self, place, and situation Psych:  Answers simple questions appropriately.   Labs: Basic Metabolic Panel: Recent Labs    05/10/22 0455 05/11/22 0410  NA 137 139  K 4.2 2.9*  CL 101 97*  CO2 27 34*  GLUCOSE 188* 91  BUN 17 20  CREATININE 1.01* 0.93  CALCIUM 7.8* 7.6*    Liver Function Tests: Recent Labs    05/09/22 2246  AST 27  ALT 16  ALKPHOS 127*  BILITOT 1.2  PROT 6.5  ALBUMIN 2.7*    No results for input(s): "LIPASE", "AMYLASE" in the last 72 hours. CBC: Recent Labs    05/10/22 0455 05/11/22 0410  WBC 12.7* 10.6*  HGB 12.1 11.6*  HCT 37.7 36.4  MCV 85.9 85.2  PLT 210 221    Cardiac Enzymes: Recent Labs    05/09/22 2246 05/10/22 0059  TROPONINIHS 10 12    BNP: Recent Labs    05/09/22 2246  BNP 351.4*    D-Dimer: No results for input(s): "DDIMER" in the last 72 hours. Hemoglobin A1C: No results for input(s): "HGBA1C" in the last 72 hours. Fasting Lipid Panel: No results for input(s): "CHOL", "HDL", "LDLCALC", "TRIG", "CHOLHDL", "LDLDIRECT" in the last 72 hours. Thyroid Function Tests: No results for input(s): "TSH", "T4TOTAL", "T3FREE", "THYROIDAB" in the last 72 hours.  Invalid input(s):  "FREET3" Anemia Panel: No results for input(s): "VITAMINB12", "FOLATE", "FERRITIN", "TIBC", "IRON", "RETICCTPCT" in the last 72 hours.   Radiology: DG Chest Portable 1 View  Result Date: 05/09/2022 CLINICAL DATA:  Shortness of breath, respiratory distress EXAM: PORTABLE CHEST 1 VIEW COMPARISON:  CT chest dated 02/25/2021 FINDINGS: Cardiomegaly with suspected mild perihilar edema and small bilateral pleural effusions. Superimposed left lower lobe opacity, atelectasis versus pneumonia. No pneumothorax. IMPRESSION: Cardiomegaly with suspected mild perihilar edema and small bilateral pleural effusions. Superimposed left lower lobe opacity, atelectasis versus pneumonia.  Electronically Signed   By: Charline Bills M.D.   On: 05/09/2022 23:04    ECHO 02/01/2019 NORMAL LEFT VENTRICULAR SYSTOLIC FUNCTION  NORMAL RIGHT VENTRICULAR SYSTOLIC FUNCTION  MILD VALVULAR REGURGITATION (See above)  NO VALVULAR STENOSIS  MILD MR, MOD TR  TRIVIAL PR  EF >55%   TELEMETRY reviewed by me (LT) 05/11/2022 : Atrial fibrillation initially rates 110s 120s, improved this afternoon to high 90s, low 100s.  EKG reviewed by me: AF PVC 131  Data reviewed by me (LT) 05/11/2022: Last PCP note, last outpatient cardiology note, ED notes, admission H&P last 24h vitals tele labs imaging I/O   Principal Problem:   Acute heart failure Active Problems:   Rapid atrial fibrillation   Dementia without behavioral disturbance   Acute respiratory failure with hypoxia   Hypertensive emergency   Stage 3a chronic kidney disease   Acute pulmonary edema   COPD exacerbation    ASSESSMENT AND PLAN:  Vanessa Logan is a 89yoF with a PMH of chronic AF (eliquis), mild MR & moderate TR by echo (2021), HTN, HLD, venous insufficiency, dementia who presented to [facility] ED from a nursing facility on 05/09/2022 with 2 weeks of worsening peripheral edema refractory to outpatient diuretics, with sudden respiratory distress on evening of admission.  Hypoxic to 70% on RA when EMS arrived, requiring BIPAP in the ED. In AF RVR on tele with initial rate in the 130s. Cardiology is consulted for further assistance.   # Acute hypoxic respiratory failure # Hypertensive emergency # Acute on chronic HFpEF Presents with at least 2 weeks of peripheral edema and weeping from a wound on her lower extremities, initially treated with torsemide and Lasix by her PCP outpatient.  EMS was called out to her nursing facility for respiratory distress, initially hypoxic to 70% on room air, requiring BiPAP overnight.  Initially significantly hypertensive to 190/100 and chest x-ray with pulmonary edema BNP borderline elevated to 351.  Started on empiric ceftriaxone and azithromycin for CAP coverage.  Clinically improved after initial diuresis, now weaned to 4L by [place], BP now low normal - continue to wean O2 as tolerated -give one more dose of Lasix 40 mg IV this AM and reassess need for further doses.  - likely discharge on lasix  daily -Stop home HCTZ -continue metoprolol XL to 12.5 mg twice daily as BP allows (home dosing) -hold losartan 50 mg daily with borderline BP  -Monitor renal function and electrolytes daily with BMP -Echo complete pending -Monitor I's/O  #?  Chronic AF with RVR Documented history of AF on every EKG dating back to 03/2020. Rate in the 120s-130s initially, rate and respiratory status have improved this afternoon.  Currently in the 90s-low 100s. -Increase metoprolol XL 12.5 mg to twice daily dosing -Continue Eliquis 5 mg twice daily for stroke prevention.  This is the correct dose with her weight >60 kg and serum creatinine <1.5.  This patient's plan of care was discussed and created with Dr. Juliann Pares and he is in agreement.  Signed: Rebeca Allegra , PA-C 05/11/2022, 8:42 AM [facility] Cardiology

## 2022-05-11 NOTE — Progress Notes (Signed)
                                                     Palliative Care Progress Note, Assessment & Plan   Patient Name: Vanessa Logan       Date: 05/11/2022 DOB: 02-12-1926  Age: 89 y.o. MRN#: [id number] Attending Physician: Delfino Lovett, MD Primary Care Physician: Marguarite Arbour, MD Admit Date: 05/09/2022  Subjective: Patient is sitting up in bed in no apparent distress.  Nasal cannula is in place.  She acknowledges my presence.  She is able to make her wishes known.  However, she is oriented to self and place.  She has no acute complaints.  She endorses she is feeling better and not having the breathing troubles that she did yesterday.  No family or friends are present at bedside during my visit.  HPI: 89 y.o. female  with past medical history of chronic AF (Eliquis, mild MR with moderate TR (echo, 2021), HTN, HLD, venous insufficiency, and dementia admitted on 05/09/2022 from home [facility] with respiratory distress.    Patient found to be hypoxic (requiring BiPAP) in A-fib and RVR.    PMT was consulted to discuss goals of care.  Summary of counseling/coordination of care: After reviewing the patient's chart and assessing the patient at bedside, I discussed symptom burden with patient.  She says she has no pain and no discomfort at this time.  We discussed being aggressive with her symptom management.  She shares she has no acute complaints.  No adjustments to medications needed at this time.  Therapeutic silence and active listening provided for patient to share her thoughts and emotions regarding current medical situation.  Emotional support provided. SHe speaks about her son's job and then wanting to know about her heart test. She is unable to participate in medical decision making or GOC discussions independently.  After assessing the patient, I  attempted to speak with her daughter over the phone.  No answer.  HIPAA compliant voicemail left.  No changes to plan of care today.  Echo still pending.  PMT will continue to follow and support patient and family with goals of care discussions throughout her hospitalization.  Physical Exam Vitals reviewed.  Constitutional:      General: She is not in acute distress.    Appearance: She is normal weight. She is not ill-appearing.  HENT:     Head: Normocephalic.     Mouth/Throat:     Mouth: Mucous membranes are moist.  Eyes:     Pupils: Pupils are equal, round, and reactive to light.  Cardiovascular:     Rate and Rhythm: Normal rate. Rhythm irregular.     Pulses: Normal pulses.  Pulmonary:     Effort: Pulmonary effort is normal.  Abdominal:     Palpations: Abdomen is soft.  Skin:    General: Skin is warm and dry.  Neurological:     Mental Status: She is alert.     Comments: Oriented to self and place             Total Time 25 minutes   Vanessa Logan L. Manon Hilding, FNP-BC Palliative Medicine Team Team Phone # [phone]

## 2022-05-11 NOTE — Assessment & Plan Note (Signed)
Replete and recheck, likely this is due to diuresis

## 2022-05-11 NOTE — Assessment & Plan Note (Signed)
On left shin-dime sized chronic appearing ulcer with dried blood.  Will consult wound care nurse

## 2022-05-11 NOTE — Progress Notes (Signed)
*  PRELIMINARY RESULTS* Echocardiogram 2D Echocardiogram has been performed.  Carolyne Fiscal 05/11/2022, 11:26 AM

## 2022-05-11 NOTE — ED Notes (Signed)
Pt paced on hospital bed, Purewick replaced, and gown changed

## 2022-05-11 NOTE — ED Notes (Signed)
Pt had removed her nasal canula in her sleep.  RN reapplied.

## 2022-05-11 NOTE — Progress Notes (Signed)
Patient's HR jumped up into the 130s to 150s.   Vitals signs collected and BP was also low.   On assessment, patient was sitting on side of bed.   Patient did denied CP, SOB or dizziness at first.   Once patient laid back down, HR decrease to 100s-110s, BP improved.   Patient did complain of some dizziness with the transition from sitting to laying.     EKG obtain and MD made aware.

## 2022-05-11 NOTE — Consult Note (Signed)
WOC Nurse Consult Note: Reason for Consult:LLE full thickness wound, Stage 1 PI to sacrum Wound type:trauma, pressure Pressure Injury POA: Yes Measurement:Bedside RN to measure and document on Nursing flow sheet with application of next dressing today Wound bed: dried serum, scab Drainage (amount, consistency, odor) scant serous Periwound: intact, dry Dressing procedure/placement/frequency: Turning and repositioning is in place., I have p[provided bilateral pressure redistribution heel boots and asked Nursing to place a silicone foam dressing to the sacrum. Topical care to the LLE wound will be with a daily cleanse followed by placement of a xeroform gauze dressing topped with dry gauze and secured with Kerlix roll gauze/paper tape.  WOC nursing team will not follow, but will remain available to this patient, the nursing and medical teams.  Please re-consult if needed.  Thank you for inviting [place] to participate in this patient's Plan of Care.  Ladona Mow, MSN, RN, CNS, GNP, Leda Min, [employer], [employer] phone:  [phone]

## 2022-05-12 DIAGNOSIS — I5031 Acute diastolic (congestive) heart failure: Secondary | ICD-10-CM | POA: Diagnosis not present

## 2022-05-12 DIAGNOSIS — J9601 Acute respiratory failure with hypoxia: Secondary | ICD-10-CM | POA: Diagnosis not present

## 2022-05-12 DIAGNOSIS — J810 Acute pulmonary edema: Secondary | ICD-10-CM | POA: Diagnosis not present

## 2022-05-12 DIAGNOSIS — J441 Chronic obstructive pulmonary disease with (acute) exacerbation: Secondary | ICD-10-CM | POA: Diagnosis not present

## 2022-05-12 DIAGNOSIS — I4891 Unspecified atrial fibrillation: Secondary | ICD-10-CM

## 2022-05-12 LAB — BASIC METABOLIC PANEL
Anion gap: 10 (ref 5–15)
BUN: 22 mg/dL (ref 8–23)
CO2: 30 mmol/L (ref 22–32)
Calcium: 8 mg/dL — ABNORMAL LOW (ref 8.9–10.3)
Chloride: 97 mmol/L — ABNORMAL LOW (ref 98–111)
Creatinine, Ser: 0.97 mg/dL (ref 0.44–1.00)
GFR, Estimated: 54 mL/min — ABNORMAL LOW (ref >60)
Glucose, Bld: 86 mg/dL (ref 70–99)
Potassium: 3.3 mmol/L — ABNORMAL LOW (ref 3.5–5.1)
Sodium: 137 mmol/L (ref 135–145)

## 2022-05-12 LAB — GLUCOSE, CAPILLARY: Glucose-Capillary: 115 mg/dL — ABNORMAL HIGH (ref 70–99)

## 2022-05-12 LAB — CBC
HCT: 40.6 % (ref 36.0–46.0)
Hemoglobin: 13 g/dL (ref 12.0–15.0)
MCH: 27.3 pg (ref 26.0–34.0)
MCHC: 32 g/dL (ref 30.0–36.0)
MCV: 85.1 fL (ref 80.0–100.0)
Platelets: 241 10*3/uL (ref 150–400)
RBC: 4.77 MIL/uL (ref 3.87–5.11)
RDW: 14.7 % (ref 11.5–15.5)
WBC: 7.8 10*3/uL (ref 4.0–10.5)
nRBC: 0 % (ref 0.0–0.2)

## 2022-05-12 LAB — CULTURE, BLOOD (ROUTINE X 2): Special Requests: ADEQUATE

## 2022-05-12 LAB — LACTIC ACID, PLASMA: Lactic Acid, Venous: 1.9 mmol/L (ref 0.5–1.9)

## 2022-05-12 MED ORDER — AMOXICILLIN-POT CLAVULANATE 875-125 MG PO TABS
1.0000 | ORAL_TABLET | Freq: Two times a day (BID) | ORAL | Status: DC
  Administered 2022-05-12 – 2022-05-13 (×2): 1 via ORAL
  Filled 2022-05-12 (×2): qty 1

## 2022-05-12 MED ORDER — FUROSEMIDE 40 MG PO TABS
40.0000 mg | ORAL_TABLET | Freq: Every day | ORAL | Status: DC
  Administered 2022-05-12 – 2022-05-13 (×2): 40 mg via ORAL
  Filled 2022-05-12 (×2): qty 1

## 2022-05-12 MED ORDER — POTASSIUM CHLORIDE CRYS ER 20 MEQ PO TBCR
40.0000 meq | EXTENDED_RELEASE_TABLET | Freq: Once | ORAL | Status: AC
  Administered 2022-05-12: 40 meq via ORAL
  Filled 2022-05-12: qty 2

## 2022-05-12 MED ORDER — MIDODRINE HCL 5 MG PO TABS: 5.0000 mg | ORAL_TABLET | Freq: Three times a day (TID) | ORAL | 0 refills | Status: DC | PRN

## 2022-05-12 MED ORDER — METOPROLOL SUCCINATE ER 25 MG PO TB24
12.5000 mg | ORAL_TABLET | Freq: Every day | ORAL | Status: DC
  Filled 2022-05-12: qty 1

## 2022-05-12 MED ORDER — AMOXICILLIN-POT CLAVULANATE 875-125 MG PO TABS: 1.0000 | ORAL_TABLET | Freq: Two times a day (BID) | ORAL | 0 refills | Status: AC

## 2022-05-12 NOTE — Progress Notes (Signed)
Palliative Care Progress Note, Assessment & Plan   Patient Name: Vanessa Logan       Date: 05/12/2022 DOB: [birth year]  Age: 89 y.o. MRN#: [id number] Attending Physician: Vanessa Courser, MD Primary Care Physician: Vanessa Arbour, MD Admit Date: 05/09/2022  Subjective: Patient is out of bed and sitting in recliner.  She has her nightgown on.  She is preparing for discharge.  Her daughter is at bedside.  Patient has no acute complaints at this time.  HPI: 89 y.o. female  with past medical history of chronic AF (Eliquis, mild MR with moderate TR (echo, 2021), HTN, HLD, venous insufficiency, and dementia admitted on 05/09/2022 from home [facility] with respiratory distress.    Patient found to be hypoxic (requiring BiPAP) in A-fib and RVR.    PMT was consulted to discuss goals of care.  Summary of counseling/coordination of care: After reviewing the patient's chart and assessing the patient at bedside, I discussed plan and goals of care with the patient and her daughter.  Patient is being discharged back to facility today.  She endorses she feels much better and has no issues at this time.  DNR remains.  Patient reiterated that she would not want anything to prolong her life.  Patient's next of kin decision maker is stated in her HCPOA as her three children.   Plan is set for discharge today.  I was unable to complete a MOST form with patient but encouraged family to complete perhaps with outpatient palliative services.  Outpatient palliative services discussed.  I highlighted the advantage of having a palliative provider as an extra layer of support for patient and family.  Daughter at bedside was accepting and interested in palliative services.  Outpatient referral placed.   PMT will made available  to patient and family throughout her hospitalization.  Please reengage with PMT if goals change, at patient/family's request, or if patient's health deteriorates during remainder of hospitalization.  Addendum:  [facility] called and I went to bedside.  I clarified the patient is a DNR.  No chest compressions had been performed.  Please see Dr. Bonnielee Logan significant event note from today for details.  Patient will remain in hospital for at least another day for monitoring.  PMT will continue to follow.  Physical Exam Constitutional:      General: She is not in acute distress.    Appearance: She is normal weight.  HENT:     Head: Normocephalic.     Mouth/Throat:     Mouth: Mucous membranes are moist.  Eyes:     Pupils: Pupils are equal, round, and reactive to light.  Cardiovascular:     Rate and Rhythm: Normal rate. Rhythm irregular.     Pulses: Normal pulses.  Pulmonary:     Effort: Pulmonary effort is normal.  Abdominal:     Palpations: Abdomen is soft.  Skin:    General: Skin is warm and dry.  Neurological:     Mental Status: She is alert.     Comments: Oriented to self and place             Total Time 50 minutes   Vanessa Logan L. Bonita Quin, DNP, FNP-BC Palliative  Medicine Team Team Phone # [phone]

## 2022-05-12 NOTE — NC FL2 (Signed)
[place] MEDICAID FL2 LEVEL OF CARE FORM     IDENTIFICATION  Patient Name: Vanessa Logan Birthdate: Dec 22, 1926 Sex: female Admission Date (Current Location): 05/09/2022  [facility] and [place] Number:  Chiropodist and Address:  [facility], [address] [place], [place] [address]      Provider Number: [id number]  Attending Physician Name and Address:  Arnetha Courser, MD  Relative Name and Phone Number:  Wells Guiles (Daughter) [phone] (Mobile)    Current Level of Care: Hospital Recommended Level of Care: Assisted Living Facility Prior Approval Number:    Date Approved/Denied:   PASRR Number:    Discharge Plan: Home    Current Diagnoses: Patient Active Problem List   Diagnosis Date Noted   Hypokalemia 05/11/2022   Pressure injury of skin 05/11/2022   Acute respiratory failure with hypoxia 05/10/2022   Acute heart failure 05/10/2022   Hypertensive emergency 05/10/2022   Stage 3a chronic kidney disease 05/10/2022   Acute pulmonary edema 05/10/2022   COPD exacerbation 05/10/2022   E coli bacteremia 02/12/2021   Dementia without behavioral disturbance 02/05/2021   Fall at home, initial encounter 02/04/2021   Acute respiratory failure 02/04/2021   AKI (acute kidney injury) 02/04/2021   Metabolic acidosis 02/04/2021   Hyperkalemia 02/04/2021   Right hip pain 02/04/2021   Rapid atrial fibrillation 02/04/2021   Multifocal pneumonia 02/04/2021   Sepsis 02/04/2021   Severe sepsis 02/04/2021   Acute UTI 07/04/2017   Fibrocystic breast disease 06/29/2012   Family history of malignant neoplasm of breast     Orientation RESPIRATION BLADDER Height & Weight     Self, Place  Normal External catheter Weight: 70 kg Height:   (175.3 cm)  BEHAVIORAL SYMPTOMS/MOOD NEUROLOGICAL BOWEL NUTRITION STATUS  Other (Comment) (n/a)  (n/a) Incontinent Diet (Heart)  AMBULATORY STATUS COMMUNICATION OF NEEDS Skin   Limited Assist Verbally   (Pressure Injury 05/11/22 Sacrum Stage 1 - Intact skin with non-blanchable redness of a localized area usually over a bony prominence)                       Personal Care Assistance Level of Assistance  Bathing, Dressing, Feeding Bathing Assistance: Limited assistance Feeding assistance: Limited assistance Dressing Assistance: Limited assistance     Functional Limitations Info  Sight, Hearing Sight Info: Impaired Hearing Info: Impaired      SPECIAL CARE FACTORS FREQUENCY  PT (By licensed PT)     PT Frequency: Min 2x weekly              Contractures Contractures Info: Not present    Additional Factors Info  Code Status, Allergies Code Status Info: DNR Allergies Info: Neosporin (Neomycin-bacitracin Zn-polymyx), Keflex (Cephalexin)           Current Medications (05/12/2022):  This is the current hospital active medication list TAKE these medications     acetaminophen 500 MG tablet Commonly known as: TYLENOL Take 500 mg by mouth every 6 (six) hours as needed for moderate pain or mild pain.    amoxicillin-clavulanate 875-125 MG tablet Commonly known as: AUGMENTIN Take 1 tablet by mouth every 12 (twelve) hours for 4 doses.    apixaban 5 MG Tabs tablet Commonly known as: ELIQUIS Take 5 mg by mouth 2 (two) times daily.    benzonatate 200 MG capsule Commonly known as: TESSALON Take 200 mg by mouth every 8 (eight) hours as needed for cough.    furosemide 40 MG tablet Commonly known as:  LASIX Take 40 mg by mouth daily.    loperamide 2 MG capsule Commonly known as: IMODIUM Take 2 mg by mouth daily.    LORazepam 0.5 MG tablet Commonly known as: ATIVAN Take 0.5 mg by mouth every 8 (eight) hours as needed for anxiety.    Magnesium 250 MG Tabs Take 250 mg by mouth 2 (two) times daily.    metoprolol succinate 25 MG 24 hr tablet Commonly known as: TOPROL-XL Take 25 mg by mouth at bedtime.    midodrine 5 MG tablet Commonly known as: PROAMATINE Take 1  tablet (5 mg total) by mouth 3 (three) times daily with meals as needed (Systolic blood pressure less than 100).    ondansetron 4 MG tablet Commonly known as: ZOFRAN Take 4 mg by mouth every 8 (eight) hours as needed for nausea or vomiting.    oxybutynin 10 MG 24 hr tablet Commonly known as: DITROPAN-XL Take 10 mg by mouth 2 (two) times daily.    pantoprazole 40 MG tablet Commonly known as: Protonix Take 1 tablet (40 mg total) by mouth daily.    Potassium Chloride ER 20 MEQ Tbcr Take 20 mEq by mouth 4 (four) times daily.    triamcinolone cream 0.5 % Commonly known as: KENALOG Apply 1 Application topically 2 (two) times daily. Apply topically to legs twice a day for rash.              Discharge Medications: Please see discharge summary for a list of discharge medications.  Relevant Imaging Results:  Relevant Lab Results:   Additional Information SSN:[id number]  Truddie Hidden, RN

## 2022-05-12 NOTE — Evaluation (Signed)
Physical Therapy Evaluation Patient Details Name: Vanessa Logan MRN: [id number] DOB: [birth year] Today's Date: 05/12/2022  History of Present Illness  Vanessa Logan is a 89yoF who comes [facility] on 4/[facility] via [facility] in respiratory distress. Pt recently on lasix due to increased LEE, wounds, weeping. Pt in AF/RVR in ED rates in 130s. PMH: AF, dementia, HTN.  Clinical Impression  Pt in bed this morning, pleasant and interactive, agreeable to assessment. Pt gets confused about orientation of location, but self corrects twice during session. Pt remains weaker than baseline but is feeling improved. Pt able to come to EOB without assist and sit at EOB for several minutes, mildly dizzy but tolerated. Pt needs physical assist coming to standing and a bit more to step-pivot to recliner with RW. Pt comfortable in recliner, all needs met. Author facilitates phone call so pt can place her lunch order. Will continue to follow.      Recommendations for follow up therapy are one component of a multi-disciplinary discharge planning process, led by the attending physician.  Recommendations may be updated based on patient status, additional functional criteria and insurance authorization.  Follow Up Recommendations       Assistance Recommended at Discharge Frequent or constant Supervision/Assistance  Patient can return home with the following  A lot of help with walking and/or transfers;A little help with bathing/dressing/bathroom;Help with stairs or ramp for entrance    Equipment Recommendations None recommended by PT  Recommendations for Other Services       Functional Status Assessment Patient has had a recent decline in their functional status and demonstrates the ability to make significant improvements in function in a reasonable and predictable amount of time.     Precautions / Restrictions        Mobility  Bed Mobility Overal bed mobility: Modified Independent                   Transfers Overall transfer level: Needs assistance Equipment used: Rolling walker (2 wheels) Transfers: Sit to/from Stand, Bed to chair/wheelchair/BSC Sit to Stand: Min assist, From elevated surface Stand pivot transfers: Min assist         General transfer comment: needs a lot of help with balance and trunk support while stepping    Ambulation/Gait Ambulation/Gait assistance:  (unable to attempt at this time, unable to achieve balance)                Stairs            Wheelchair Mobility    Modified Rankin (Stroke Patients Only)       Balance                                             Pertinent Vitals/Pain Pain Assessment Pain Assessment: No/denies pain    Home Living Family/patient expects to be discharged to:: Assisted living                 Home Equipment: Rollator (4 wheels);Wheelchair - manual      Prior Function Prior Level of Function : Patient poor historian/Family not available;Needs assist  Cognitive Assist : ADLs (cognitive)     Physical Assist : ADLs (physical)             Hand Dominance   Dominant Hand: Right    Extremity/Trunk Assessment  Communication   Communication: [facility]  Cognition                                                General Comments      Exercises Other Exercises Other Exercises: EOB sitting x 5 minutes   Assessment/Plan    PT Assessment Patient needs continued PT services  PT Problem List Decreased strength;Decreased range of motion;Decreased activity tolerance;Decreased balance;Decreased mobility       PT Treatment Interventions Functional mobility training;Gait training;DME instruction;Balance training;Therapeutic activities;Therapeutic exercise;Patient/family education;Cognitive remediation    PT Goals (Current goals can be found in the Care Plan section)  Acute Rehab PT Goals Patient Stated Goal: regain strength, be able  to transfer on her own once again PT Goal Formulation: With patient Time For Goal Achievement: 05/26/22 Potential to Achieve Goals: Good    Frequency Min 3X/week     Co-evaluation               AM-PAC PT "6 Clicks" Mobility  Outcome Measure Help needed turning from your back to your side while in a flat bed without using bedrails?: A Little Help needed moving from lying on your back to sitting on the side of a flat bed without using bedrails?: A Little Help needed moving to and from a bed to a chair (including a wheelchair)?: A Lot Help needed standing up from a chair using your arms (e.g., wheelchair or bedside chair)?: A Lot Help needed to walk in hospital room?: Total Help needed climbing 3-5 steps with a railing? : Total 6 Click Score: 12    End of Session   Activity Tolerance: Patient tolerated treatment well;No increased pain;Patient limited by fatigue Patient left: in chair;with call bell/phone within reach;with chair alarm set Nurse Communication: Mobility status PT Visit Diagnosis: Difficulty in walking, not elsewhere classified (R26.2);Other abnormalities of gait and mobility (R26.89);Muscle weakness (generalized) (M62.81)    Time: 1109-1130 PT Time Calculation (min) (ACUTE ONLY): 21 min   Charges:   PT Evaluation $PT Eval Moderate Complexity: 1 Mod PT Treatments $Therapeutic Activity: 8-22 mins   11:56 AM, 05/12/22 Vanessa Logan, PT, DPT Physical Therapist - [facility]  [phone] (ASCOM)      Vanessa Logan C 05/12/2022, 11:52 AM

## 2022-05-12 NOTE — Progress Notes (Addendum)
[facility] CLINIC CARDIOLOGY CONSULT NOTE       Patient ID: Vanessa Logan MRN: [id number] DOB/AGE: 09-12-1926 89 y.o.  Admit date: 05/09/2022 Referring Physician Dr. Lindajo Royal Primary Physician Dr. Judithann Sheen  Primary Cardiologist Dr. Darrold Junker Reason for Consultation Acute CHF   HPI: Vanessa Logan is a 89yoF with a PMH of chronic AF (eliquis), mild MR & moderate TR by echo (2021), HTN, HLD, venous insufficiency, dementia who presented to [facility] ED from a nursing facility on 05/09/2022 with 2 weeks of worsening peripheral edema refractory to outpatient diuretics, with sudden respiratory distress on evening of admission. Hypoxic to 70% on RA when EMS arrived, requiring BIPAP in the ED. In AF RVR on tele with initial rate in the 130s. Cardiology is consulted for further assistance.   Interval History:  - awake and alert today. Oriented to self but not place or situation. Talks about how she slept well, other speech somewhat disorganized.  - no chest pain or shortness of breath - weaned to room air - remains in AF on tele with rate in the 80s to high 90s  ADDENDUM 3:58pm: - patient was being prepared for discharge and while transferring from bed to wheelchair patient reportedly felt dizzy and had her eyes rolling back into her head and began shaking. Appeared gray and ashen and was without a pulse for ~20 seconds per beside nurse. She is a DNR and no chest compressions performed. Eventually responsive to sternal rub and pulse regained. Helped back into bed.  - at my time of evaluation this afternoon she says she feels sleepy and "not good." Feels nauseous but chest pain. EKG nonacute, redemonstrates chronic AF without acute ST or T wave changes - of note, while working with PT earlier, reported some dizziness with sitting at edge of bed for several minutes.  - suspect vasovagal in nature   PLAN: - bolus of NS - agree with midodrine - hold PM dose of metoprolol  - continue tele - no  additional cardiac diagnostics at this time   Review of systems limited by patient's baseline dementia    Past Medical History:  Diagnosis Date   Atrial fibrillation    Breast screening, unspecified 06/18/11   Diffuse cystic mastopathy    Family history of malignant neoplasm of breast 2013   Obesity, unspecified 2013   Screening for obesity 2013   Special screening for malignant neoplasms, colon 06/21/2011   Unspecified essential hypertension     Past Surgical History:  Procedure Laterality Date   APPENDECTOMY  1950   BREAST BIOPSY Bilateral 1992   neg   BREAST BIOPSY Right 1996   neg   BREAST BIOPSY Right 2005   neg   COLONOSCOPY  2007   COLONOSCOPY  2013   Dr Ricki Rodriguez   COLONOSCOPY W/ POLYPECTOMY  2004   ORIF WRIST FRACTURE Left 04/15/2020   Procedure: OPEN REDUCTION INTERNAL FIXATION LEFT DISTAL RADIUS FRACTION;  Surgeon: Kennedy Bucker, MD;  Location: [facility] ORS;  Service: Orthopedics;  Laterality: Left;   VARICOSE VEIN SURGERY  1983    Medications Prior to Admission  Medication Sig Dispense Refill Last Dose   acetaminophen (TYLENOL) 500 MG tablet Take 500 mg by mouth every 6 (six) hours as needed for moderate pain or mild pain.   prn   apixaban (ELIQUIS) 5 MG TABS tablet Take 5 mg by mouth 2 (two) times daily.   unknown   benzonatate (TESSALON) 200 MG capsule Take 200 mg by mouth every 8 (  eight) hours as needed for cough.   prn   furosemide (LASIX) 40 MG tablet Take 40 mg by mouth daily.   unknown   hydrochlorothiazide (HYDRODIURIL) 25 MG tablet Take 25 mg by mouth daily.   unknown   loperamide (IMODIUM) 2 MG capsule Take 2 mg by mouth daily.   unknown   LORazepam (ATIVAN) 0.5 MG tablet Take 0.5 mg by mouth every 8 (eight) hours as needed for anxiety.   prn   Magnesium 250 MG TABS Take 250 mg by mouth 2 (two) times daily.   unknown   metoprolol succinate (TOPROL-XL) 25 MG 24 hr tablet Take 25 mg by mouth at bedtime.   unknown   ondansetron (ZOFRAN) 4 MG tablet Take 4 mg by  mouth every 8 (eight) hours as needed for nausea or vomiting.   prn   oxybutynin (DITROPAN-XL) 10 MG 24 hr tablet Take 10 mg by mouth 2 (two) times daily.   unknown   pantoprazole (PROTONIX) 40 MG tablet Take 1 tablet (40 mg total) by mouth daily.   unknown   Potassium Chloride ER 20 MEQ TBCR Take 20 mEq by mouth 4 (four) times daily.   unknown   triamcinolone cream (KENALOG) 0.5 % Apply 1 Application topically 2 (two) times daily. Apply topically to legs twice a day for rash.   unknown    Social History   Socioeconomic History   Marital status: Widowed    Spouse name: Not on file   Number of children: Not on file   Years of education: Not on file   Highest education level: Not on file  Occupational History   Not on file  Tobacco Use   Smoking status: Never   Smokeless tobacco: Never  Substance and Sexual Activity   Alcohol use: No    Alcohol/week: 0.0 standard drinks of alcohol   Drug use: No   Sexual activity: Not on file  Other Topics Concern   Not on file  Social History Narrative   Not on file   Social Determinants of Health   Financial Resource Strain: Not on file  Food Insecurity: No Food Insecurity (05/10/2022)   Hunger Vital Sign    Worried About Running Out of Food in the Last Year: Never true    Ran Out of Food in the Last Year: Never true  Transportation Needs: No Transportation Needs (05/10/2022)   PRAPARE - Administrator, Civil Service (Medical): No    Lack of Transportation (Non-Medical): No  Physical Activity: Not on file  Stress: Not on file  Social Connections: Not on file  Intimate Partner Violence: Not At Risk (05/10/2022)   Humiliation, Afraid, Rape, and Kick questionnaire    Fear of Current or Ex-Partner: No    Emotionally Abused: No    Physically Abused: No    Sexually Abused: No    Family History  Problem Relation Age of Onset   Breast cancer Sister 54   Pancreatic cancer Sister    Colon cancer Daughter    Breast cancer Sister  49     No intake or output data in the 24 hours ending 05/12/22 0836   Vitals:   05/12/22 0017 05/12/22 0300 05/12/22 0344 05/12/22 0814  BP: 111/64  121/80 133/83  Pulse: 97  (!) 110 97  Resp: Temp: 97.7 F (36.5 C)  97.6 F (36.4 C) (!) 97.5 F (36.4 C)  TempSrc: Oral     SpO2: 94%  95%  97%  Weight:  70 kg    Height:        PHYSICAL EXAM General: Elderly and frail Caucasian female, in no acute distress.  Sitting upright in PCU bed HEENT:  Normocephalic and atraumatic. Neck:  No JVD.  Lungs: Normal respiratory effort on room air. Decreased breath sounds with trace bibasilar crackles, improved from yesterday Heart: Irregularly irregular with controlled rate. Normal S1 and S2 without gallops or murmurs.  Abdomen: Non-distended appearing.  Msk: Normal strength and tone for age. Extremities: warm to touch. Generalized erythema to bilateral lower extremities with skin wrinkling and dimpling with trace edema R > L. Left leg wrapped in kerlix and unna boots on both feet Neuro: Alert and oriented to self, but not place or situation Psych:  Answers simple questions appropriately.   Labs: Basic Metabolic Panel: Recent Labs    05/11/22 0410 05/12/22 0409  NA 139 137  K 2.9* 3.3*  CL 97* 97*  CO2 34* 30  GLUCOSE 91 86  BUN 20 22  CREATININE 0.93 0.97  CALCIUM 7.6* 8.0*    Liver Function Tests: Recent Labs    05/09/22 2246  AST 27  ALT 16  ALKPHOS 127*  BILITOT 1.2  PROT 6.5  ALBUMIN 2.7*    No results for input(s): "LIPASE", "AMYLASE" in the last 72 hours. CBC: Recent Labs    05/11/22 0410 05/12/22 0409  WBC 10.6* 7.8  HGB 11.6* 13.0  HCT 36.4 40.6  MCV 85.2 85.1  PLT 221 241    Cardiac Enzymes: Recent Labs    05/09/22 2246 05/10/22 0059  TROPONINIHS 10 12    BNP: Recent Labs    05/09/22 2246  BNP 351.4*    D-Dimer: No results for input(s): "DDIMER" in the last 72 hours. Hemoglobin A1C: No results for input(s): "HGBA1C" in  the last 72 hours. Fasting Lipid Panel: No results for input(s): "CHOL", "HDL", "LDLCALC", "TRIG", "CHOLHDL", "LDLDIRECT" in the last 72 hours. Thyroid Function Tests: No results for input(s): "TSH", "T4TOTAL", "T3FREE", "THYROIDAB" in the last 72 hours.  Invalid input(s): "FREET3" Anemia Panel: No results for input(s): "VITAMINB12", "FOLATE", "FERRITIN", "TIBC", "IRON", "RETICCTPCT" in the last 72 hours.   Radiology: ECHOCARDIOGRAM COMPLETE  Result Date: 05/11/2022    ECHOCARDIOGRAM REPORT   Patient Name:   ROLA LENNON Date of Exam: 05/11/2022 Medical Rec #:  [id number]    Height:       68.0 in Accession #:    [id number]   Weight:       160.0 lb Date of Birth:  1926-04-06    BSA:          1.859 m Patient Age:    89 years     BP:           99/62 mmHg Patient Gender: F            HR:           94 bpm. Exam Location:  [facility] Procedure: 2D Echo, Cardiac Doppler and Color Doppler Indications:     CHF  History:         Patient has prior history of Echocardiogram examinations, most                  recent 02/10/2021. CHF, COPD, Arrythmias:Atrial Fibrillation;                  Risk Factors:Hypertension.  Sonographer:     Mikki Harbor Referring Phys:  [id number] Twain Stenseth MICHELLE Shamarra Warda Diagnosing Phys:  Alwyn Pea MD IMPRESSIONS  1. Left ventricular ejection fraction, by estimation, is 50 to 55%. The left ventricle has low normal function. The left ventricle has no regional wall motion abnormalities. There is mild left ventricular hypertrophy. Left ventricular diastolic parameters are consistent with Grade I diastolic dysfunction (impaired relaxation). There is the interventricular septum is flattened in diastole ('D' shaped left ventricle), consistent with right ventricular volume overload.  2. Right ventricular systolic function is moderately reduced. The right ventricular size is moderately enlarged. Mildly increased right ventricular wall thickness. There is mildly elevated pulmonary artery systolic pressure.   3. Left atrial size was mildly dilated.  4. Right atrial size was mildly dilated.  5. The mitral valve is normal in structure. Mild mitral valve regurgitation.  6. Tricuspid valve regurgitation is moderate.  7. The aortic valve is normal in structure. Aortic valve regurgitation is not visualized. FINDINGS  Left Ventricle: Left ventricular ejection fraction, by estimation, is 50 to 55%. The left ventricle has low normal function. The left ventricle has no regional wall motion abnormalities. The left ventricular internal cavity size was normal in size. There is mild left ventricular hypertrophy. The interventricular septum is flattened in diastole ('D' shaped left ventricle), consistent with right ventricular volume overload. Left ventricular diastolic parameters are consistent with Grade I diastolic dysfunction (impaired relaxation). Right Ventricle: The right ventricular size is moderately enlarged. Mildly increased right ventricular wall thickness. Right ventricular systolic function is moderately reduced. There is mildly elevated pulmonary artery systolic pressure. The tricuspid regurgitant velocity is 2.45 m/s, and with an assumed right atrial pressure of 8 mmHg, the estimated right ventricular systolic pressure is 32.0 mmHg. Left Atrium: Left atrial size was mildly dilated. Right Atrium: Right atrial size was mildly dilated. Pericardium: There is no evidence of pericardial effusion. Mitral Valve: The mitral valve is normal in structure. Mild mitral valve regurgitation. MV peak gradient, 7.2 mmHg. The mean mitral valve gradient is 2.0 mmHg. Tricuspid Valve: The tricuspid valve is normal in structure. Tricuspid valve regurgitation is moderate. Aortic Valve: The aortic valve is normal in structure. Aortic valve regurgitation is not visualized. Aortic valve mean gradient measures 3.3 mmHg. Aortic valve peak gradient measures 6.5 mmHg. Aortic valve area, by VTI measures 2.39 cm. Pulmonic Valve: The pulmonic valve  was normal in structure. Pulmonic valve regurgitation is not visualized. Aorta: The ascending aorta was not well visualized. IAS/Shunts: No atrial level shunt detected by color flow Doppler.  LEFT VENTRICLE PLAX 2D LVIDd:         3.50 cm LVIDs:         2.60 cm LV PW:         1.20 cm LV IVS:        1.20 cm LVOT diam:     1.90 cm LV SV:         57 LV SV Index:   31 LVOT Area:     2.84 cm  RIGHT VENTRICLE RV Basal diam:  3.20 cm RV Mid diam:    2.50 cm RV S prime:     20.30 cm/s TAPSE (M-mode): 4.1 cm LEFT ATRIUM             Index        RIGHT ATRIUM           Index LA diam:        4.10 cm 2.21 cm/m   RA Area:     28.10 cm LA Vol (A2C):   91.9 ml 49.44 ml/m  RA Volume:   83.80 ml  45.08 ml/m LA Vol (A4C):   69.1 ml 37.17 ml/m LA Biplane Vol: 84.7 ml 45.57 ml/m  AORTIC VALVE                    PULMONIC VALVE AV Area (Vmax):    2.19 cm     PV Vmax:       0.81 m/s AV Area (Vmean):   2.04 cm     PV Peak grad:  2.6 mmHg AV Area (VTI):     2.39 cm AV Vmax:           127.67 cm/s AV Vmean:          81.667 cm/s AV VTI:            0.238 m AV Peak Grad:      6.5 mmHg AV Mean Grad:      3.3 mmHg LVOT Vmax:         98.80 cm/s LVOT Vmean:        58.733 cm/s LVOT VTI:          0.201 m LVOT/AV VTI ratio: 0.84  AORTA Ao Root diam: 3.20 cm MITRAL VALVE                TRICUSPID VALVE MV Area (PHT): 3.96 cm     TR Peak grad:   24.0 mmHg MV Area VTI:   1.99 cm     TR Vmax:        245.00 cm/s MV Peak grad:  7.2 mmHg MV Mean grad:  2.0 mmHg     SHUNTS MV Vmax:       1.35 m/s     Systemic VTI:  0.20 m MV Vmean:      54.8 cm/s    Systemic Diam: 1.90 cm MV Decel Time: 192 msec MV E velocity: 104.00 cm/s Alwyn Pea MD Electronically signed by Alwyn Pea MD Signature Date/Time: 05/11/2022/12:53:39 PM    Final    DG Chest Portable 1 View  Result Date: 05/09/2022 CLINICAL DATA:  Shortness of breath, respiratory distress EXAM: PORTABLE CHEST 1 VIEW COMPARISON:  CT chest dated 02/25/2021 FINDINGS: Cardiomegaly with  suspected mild perihilar edema and small bilateral pleural effusions. Superimposed left lower lobe opacity, atelectasis versus pneumonia. No pneumothorax. IMPRESSION: Cardiomegaly with suspected mild perihilar edema and small bilateral pleural effusions. Superimposed left lower lobe opacity, atelectasis versus pneumonia. Electronically Signed   By: Charline Bills M.D.   On: 05/09/2022 23:04    ECHO 02/01/2019 NORMAL LEFT VENTRICULAR SYSTOLIC FUNCTION  NORMAL RIGHT VENTRICULAR SYSTOLIC FUNCTION  MILD VALVULAR REGURGITATION (See above)  NO VALVULAR STENOSIS  MILD MR, MOD TR  TRIVIAL PR  EF >55%   TELEMETRY reviewed by me (LT) 05/12/2022 : Atrial fibrillation initially rates 110s 120s, improved this afternoon to high 90s, low 100s.  EKG reviewed by me: AF PVC 131  Data reviewed by me (LT) 05/12/2022: Last PCP note, last outpatient cardiology note, ED notes, admission H&P last 24h vitals tele labs imaging I/O   Principal Problem:   Acute heart failure Active Problems:   Rapid atrial fibrillation   Dementia without behavioral disturbance   Acute respiratory failure with hypoxia   Hypertensive emergency   Stage 3a chronic kidney disease   Acute pulmonary edema   COPD exacerbation   Hypokalemia   Pressure injury of skin    ASSESSMENT AND PLAN:  Vanessa Logan is a 89yoF with a PMH of  chronic AF (eliquis), mild MR & moderate TR by echo (2021), HTN, HLD, venous insufficiency, dementia who presented to [facility] ED from a nursing facility on 05/09/2022 with 2 weeks of worsening peripheral edema refractory to outpatient diuretics, with sudden respiratory distress on evening of admission. Hypoxic to 70% on RA when EMS arrived, requiring BIPAP in the ED. In AF RVR on tele with initial rate in the 130s. Cardiology is consulted for further assistance.   # Acute hypoxic respiratory failure # Hypertensive emergency # Acute on chronic HFpEF Presents with at least 2 weeks of peripheral edema and weeping  from a wound on her lower extremities, initially treated with torsemide and Lasix by her PCP outpatient.  EMS was called out to her nursing facility for respiratory distress, initially hypoxic to 70% on room air, requiring BiPAP overnight.  Initially significantly hypertensive to 190/100 and chest x-ray with pulmonary edema BNP borderline elevated to 351.  Started on empiric ceftriaxone and azithromycin for CAP coverage.  Clinically improved after diuresis, clinically euvolemic today on exam and on room air. - discharge home on lasix 40mg  daily -Stop home HCTZ -continue metoprolol XL to 12.5 mg twice daily as BP allows (home dosing) -hold losartan 50 mg daily with borderline BP  -Echo complete with normal LVEF, g1DD, moderate TR  #?  Chronic AF with RVR Documented history of AF on every EKG dating back to 03/2020. Rate in the 120s-130s initially, rate and respiratory status have improved.  Currently in the 80s-90s -conitue metoprolol XL 12.5 mg to twice daily dosing -Continue Eliquis 5 mg twice daily for stroke prevention.  This is the correct dose with her weight >60 kg and serum creatinine <1.5.  Ok for discharge today from a cardiac perspective. Will arrange for follow up in clinic with Dr. Darrold Junker in 1-2 weeks.    This patient's plan of care was discussed and created with Dr. Darrold Junker and he is in agreement.  Signed: Rebeca Allegra , PA-C 05/12/2022, 8:36 AM [facility] Cardiology

## 2022-05-12 NOTE — Progress Notes (Signed)
   05/12/22 1400  Spiritual Encounters  Type of Visit Initial  Care provided to: Patient  Conversation partners present during encounter Nurse  Referral source Nurse (RN/NT/LPN)  Reason for visit Code  OnCall Visit Yes   Chaplain responded to code. Care team with patient. No family present. If daughter is located, please page the chaplain on call. Chaplain services are available for follow up as needed.

## 2022-05-12 NOTE — Discharge Summary (Addendum)
Physician Discharge Summary   Patient: Vanessa Logan MRN: [id number] DOB: [birth year]  Admit date:     05/09/2022  Discharge date: 05/13/22  Discharge Physician: Arnetha Courser   PCP: Marguarite Arbour, MD   Recommendations at discharge:  Please obtain CBC and BMP in 1 week Follow-up with cardiology Follow-up with primary care provider  Discharge Diagnoses: Principal Problem:   Acute heart failure Active Problems:   Rapid atrial fibrillation   Acute respiratory failure with hypoxia   Hypertensive emergency   Dementia without behavioral disturbance   Stage 3a chronic kidney disease   Acute pulmonary edema   COPD exacerbation   Hypokalemia   Pressure injury of skin   Hospital Course: 89 y.o. female with medical history significant for HTN, A. fib on Eliquis, dementia, hospitalized a year ago with  multifocal pneumonia and E. coli septicemia, admitted for heart failure exacerbation.  Cardiology was consulted and patient received IV Lasix. Repeat echocardiogram with low normal EF at 50 to 55%, grade 1 diastolic dysfunction, no other significant acute abnormalities.  Patient was able to wean back to room air and feeling much improved. Clinically appears euvolemic.  Lactic acidosis has been resolved.  She also received ceftriaxone and Zithromax for concern of pneumonia.  Being discharged on 2 more days of Augmentin.  Her home HCTZ was discontinued due to softer blood pressure.  Patient can use midodrine as needed if systolic less than 100.  Systolic improved to above 130 on the time of discharge.  Cardiology restarted back her home p.o. Lasix and metoprolol which she will continue.  Patient will continue on current medications.  At baseline she does not walk much and thinks that she is at her normal at this time.  Currently on room air.  Patient had a syncopal episode yesterday while getting out of the hospital so we kept her for another night.  Repeat EKG was without any acute  abnormality.  It was thought to be due to vasovagal phenomena.  Patient received 500 cc bolus.  Vitals remained stable.  She was stable this morning and would like to get out of hospital.  Patient need to have a close follow-up with her providers for further recommendations.  Assessment and Plan: * Acute heart failure Etiology suspect secondary to hypertensive emergency or rapid A-fib.  Lower suspicion for pneumonia though with finding of lower lobe opacity on chest x-ray.  Low suspicion for acute PE given that patient is on chronic anticoagulation.  Patient was treated for COPD and route but no prior history of same - Continue IV Lasix, continue metoprolol.  Holding HCTZ and losartan -Echocardiogram pending.  Patient had normal echo February 2023 with EF 60 to 65% - Daily weights with intake and output monitoring - Cardiology consult and seen Net IO Since Admission: -3,675 mL [05/11/22 1413]   Rapid atrial fibrillation EKG showed A-fib with a rate of 131 Could be driving acute heart failure or be related to another underlying acute etiology - Treat acute CHF as above and hypoxia - Metoprolol for rate control -Continue Eliquis for stroke prevention  Hypertensive emergency SBP in the 190s on arrival but now much better controlled Continue Lasix, and metoprolol  Acute respiratory failure with hypoxia Suspect related to CHF/hypertensive emergency/rapid A-fib Possible respiratory tract infection (history of multifocal pneumonia and E. coli septicemia 2023) Low suspicion for PE given chronic anticoagulation O2 sats in the 70s on arrival of EMS - Required BiPAP on admission but now weaned off to 5  L nasal cannula oxygen -Treat CHF as outlined above - procalcitonin 0.17 and lactic acid trending down.  Respiratory viral panel was negative - Antitussives if coughing.  - Given chest x-ray finding of possible infiltrate will cover with ceftriaxone and azithromycin  Pressure injury of skin On  left shin-dime sized chronic appearing ulcer with dried blood.  Will consult wound care nurse  Hypokalemia Replete and recheck, likely this is due to diuresis  Stage 3a chronic kidney disease Renal function at baseline.  Dementia without behavioral disturbance Delirium precautions.  Palliative care consultation         Consultants: Cardiology Procedures performed: None Disposition: Assisted living Diet recommendation:  Discharge Diet Orders (From admission, onward)     Start     Ordered   05/12/22 0000  Diet - low sodium heart healthy        05/12/22 1145           Cardiac and Carb modified diet DISCHARGE MEDICATION: Allergies as of 05/13/2022       Reactions   Neosporin [neomycin-bacitracin Zn-polymyx] Itching, Swelling   Keflex [cephalexin] Rash        Medication List     STOP taking these medications    hydrochlorothiazide 25 MG tablet Commonly known as: HYDRODIURIL       TAKE these medications    acetaminophen 500 MG tablet Commonly known as: TYLENOL Take 500 mg by mouth every 6 (six) hours as needed for moderate pain or mild pain.   amoxicillin-clavulanate 875-125 MG tablet Commonly known as: AUGMENTIN Take 1 tablet by mouth every 12 (twelve) hours for 4 doses.   apixaban 5 MG Tabs tablet Commonly known as: ELIQUIS Take 5 mg by mouth 2 (two) times daily.   benzonatate 200 MG capsule Commonly known as: TESSALON Take 200 mg by mouth every 8 (eight) hours as needed for cough.   furosemide 40 MG tablet Commonly known as: LASIX Take 40 mg by mouth daily.   loperamide 2 MG capsule Commonly known as: IMODIUM Take 2 mg by mouth daily.   LORazepam 0.5 MG tablet Commonly known as: ATIVAN Take 0.5 mg by mouth every 8 (eight) hours as needed for anxiety.   Magnesium 250 MG Tabs Take 250 mg by mouth 2 (two) times daily.   metoprolol succinate 25 MG 24 hr tablet Commonly known as: TOPROL-XL Take 25 mg by mouth at bedtime.   midodrine 5  MG tablet Commonly known as: PROAMATINE Take 1 tablet (5 mg total) by mouth 3 (three) times daily with meals as needed (Systolic blood pressure less than 100).   ondansetron 4 MG tablet Commonly known as: ZOFRAN Take 4 mg by mouth every 8 (eight) hours as needed for nausea or vomiting.   oxybutynin 10 MG 24 hr tablet Commonly known as: DITROPAN-XL Take 10 mg by mouth 2 (two) times daily.   pantoprazole 40 MG tablet Commonly known as: Protonix Take 1 tablet (40 mg total) by mouth daily.   Potassium Chloride ER 20 MEQ Tbcr Take 20 mEq by mouth 4 (four) times daily.   triamcinolone cream 0.5 % Commonly known as: KENALOG Apply 1 Application topically 2 (two) times daily. Apply topically to legs twice a day for rash.               Discharge Care Instructions  (From admission, onward)           Start     Ordered   05/12/22 0000  Discharge wound care:  Comments: Woiund care to left LE wound:  Cleanse with soap and water, rinse with NS, and pat dry. Cover with folded layer of xeroform gauze, top with dry gauze and secure with a few turns of Kerlix roll gauze/paper tape. Change daily.   05/12/22 1145            Follow-up Information     Marguarite Arbour, MD. Go in 1 week(s).   Specialty: Internal Medicine Why: Appointment on Wednesday, 05/19/2022 at 1:30pm with Rob. Contact information: [facility] [address] [place] [address] [phone]         Marcina Millard, MD. Go in 1 week(s).   Specialty: Cardiology Contact information: [address] [facility] [place] [address] [phone]                Discharge Exam: Ceasar Mons Weights   05/11/22 1329 05/12/22 0300 05/13/22 0345  Weight: 69.2 kg 70 kg 70.1 kg   General.  Frail elderly lady, in no acute distress. Pulmonary.  Lungs clear bilaterally, normal respiratory effort. CV.  Regular rate and rhythm, no JVD, rub or murmur. Abdomen.   Soft, nontender, nondistended, BS positive. CNS.  Alert and oriented .  No focal neurologic deficit. Extremities.  Trace LE edema, no cyanosis, pulses intact and symmetrical. Psychiatry.  Judgment and insight appears normal.   Condition at discharge: stable  The results of significant diagnostics from this hospitalization (including imaging, microbiology, ancillary and laboratory) are listed below for reference.   Imaging Studies: ECHOCARDIOGRAM COMPLETE  Result Date: 05/11/2022    ECHOCARDIOGRAM REPORT   Patient Name:   HADLEIGH FELBER Date of Exam: 05/11/2022 Medical Rec #:  [id number]    Height:       68.0 in Accession #:    [id number]   Weight:       160.0 lb Date of Birth:  1926/10/04    BSA:          1.859 m Patient Age:    89 years     BP:           99/62 mmHg Patient Gender: F            HR:           94 bpm. Exam Location:  [facility] Procedure: 2D Echo, Cardiac Doppler and Color Doppler Indications:     CHF  History:         Patient has prior history of Echocardiogram examinations, most                  recent 02/10/2021. CHF, COPD, Arrythmias:Atrial Fibrillation;                  Risk Factors:Hypertension.  Sonographer:     Mikki Harbor Referring Phys:  [id number] LILY MICHELLE TANG Diagnosing Phys: Alwyn Pea MD IMPRESSIONS  1. Left ventricular ejection fraction, by estimation, is 50 to 55%. The left ventricle has low normal function. The left ventricle has no regional wall motion abnormalities. There is mild left ventricular hypertrophy. Left ventricular diastolic parameters are consistent with Grade I diastolic dysfunction (impaired relaxation). There is the interventricular septum is flattened in diastole ('D' shaped left ventricle), consistent with right ventricular volume overload.  2. Right ventricular systolic function is moderately reduced. The right ventricular size is moderately enlarged. Mildly increased right ventricular wall thickness. There is mildly elevated pulmonary artery  systolic pressure.  3. Left atrial size was mildly dilated.  4. Right atrial  size was mildly dilated.  5. The mitral valve is normal in structure. Mild mitral valve regurgitation.  6. Tricuspid valve regurgitation is moderate.  7. The aortic valve is normal in structure. Aortic valve regurgitation is not visualized. FINDINGS  Left Ventricle: Left ventricular ejection fraction, by estimation, is 50 to 55%. The left ventricle has low normal function. The left ventricle has no regional wall motion abnormalities. The left ventricular internal cavity size was normal in size. There is mild left ventricular hypertrophy. The interventricular septum is flattened in diastole ('D' shaped left ventricle), consistent with right ventricular volume overload. Left ventricular diastolic parameters are consistent with Grade I diastolic dysfunction (impaired relaxation). Right Ventricle: The right ventricular size is moderately enlarged. Mildly increased right ventricular wall thickness. Right ventricular systolic function is moderately reduced. There is mildly elevated pulmonary artery systolic pressure. The tricuspid regurgitant velocity is 2.45 m/s, and with an assumed right atrial pressure of 8 mmHg, the estimated right ventricular systolic pressure is 32.0 mmHg. Left Atrium: Left atrial size was mildly dilated. Right Atrium: Right atrial size was mildly dilated. Pericardium: There is no evidence of pericardial effusion. Mitral Valve: The mitral valve is normal in structure. Mild mitral valve regurgitation. MV peak gradient, 7.2 mmHg. The mean mitral valve gradient is 2.0 mmHg. Tricuspid Valve: The tricuspid valve is normal in structure. Tricuspid valve regurgitation is moderate. Aortic Valve: The aortic valve is normal in structure. Aortic valve regurgitation is not visualized. Aortic valve mean gradient measures 3.3 mmHg. Aortic valve peak gradient measures 6.5 mmHg. Aortic valve area, by VTI measures 2.39 cm. Pulmonic Valve:  The pulmonic valve was normal in structure. Pulmonic valve regurgitation is not visualized. Aorta: The ascending aorta was not well visualized. IAS/Shunts: No atrial level shunt detected by color flow Doppler.  LEFT VENTRICLE PLAX 2D LVIDd:         3.50 cm LVIDs:         2.60 cm LV PW:         1.20 cm LV IVS:        1.20 cm LVOT diam:     1.90 cm LV SV:         57 LV SV Index:   31 LVOT Area:     2.84 cm  RIGHT VENTRICLE RV Basal diam:  3.20 cm RV Mid diam:    2.50 cm RV S prime:     20.30 cm/s TAPSE (M-mode): 4.1 cm LEFT ATRIUM             Index        RIGHT ATRIUM           Index LA diam:        4.10 cm 2.21 cm/m   RA Area:     28.10 cm LA Vol (A2C):   91.9 ml 49.44 ml/m  RA Volume:   83.80 ml  45.08 ml/m LA Vol (A4C):   69.1 ml 37.17 ml/m LA Biplane Vol: 84.7 ml 45.57 ml/m  AORTIC VALVE                    PULMONIC VALVE AV Area (Vmax):    2.19 cm     PV Vmax:       0.81 m/s AV Area (Vmean):   2.04 cm     PV Peak grad:  2.6 mmHg AV Area (VTI):     2.39 cm AV Vmax:           127.67 cm/s AV Vmean:  81.667 cm/s AV VTI:            0.238 m AV Peak Grad:      6.5 mmHg AV Mean Grad:      3.3 mmHg LVOT Vmax:         98.80 cm/s LVOT Vmean:        58.733 cm/s LVOT VTI:          0.201 m LVOT/AV VTI ratio: 0.84  AORTA Ao Root diam: 3.20 cm MITRAL VALVE                TRICUSPID VALVE MV Area (PHT): 3.96 cm     TR Peak grad:   24.0 mmHg MV Area VTI:   1.99 cm     TR Vmax:        245.00 cm/s MV Peak grad:  7.2 mmHg MV Mean grad:  2.0 mmHg     SHUNTS MV Vmax:       1.35 m/s     Systemic VTI:  0.20 m MV Vmean:      54.8 cm/s    Systemic Diam: 1.90 cm MV Decel Time: 192 msec MV E velocity: 104.00 cm/s Alwyn Pea MD Electronically signed by Alwyn Pea MD Signature Date/Time: 05/11/2022/12:53:39 PM    Final    DG Chest Portable 1 View  Result Date: 05/09/2022 CLINICAL DATA:  Shortness of breath, respiratory distress EXAM: PORTABLE CHEST 1 VIEW COMPARISON:  CT chest dated 02/25/2021 FINDINGS:  Cardiomegaly with suspected mild perihilar edema and small bilateral pleural effusions. Superimposed left lower lobe opacity, atelectasis versus pneumonia. No pneumothorax. IMPRESSION: Cardiomegaly with suspected mild perihilar edema and small bilateral pleural effusions. Superimposed left lower lobe opacity, atelectasis versus pneumonia. Electronically Signed   By: Charline Bills M.D.   On: 05/09/2022 23:04    Microbiology: Results for orders placed or performed during the hospital encounter of 05/09/22  Resp panel by RT-PCR (RSV, Flu A&B, Covid) Anterior Nasal Swab     Status: None   Collection Time: 05/09/22 10:47 PM   Specimen: Anterior Nasal Swab  Result Value Ref Range Status   SARS Coronavirus 2 by RT PCR NEGATIVE NEGATIVE Final    Comment: (NOTE) SARS-CoV-2 target nucleic acids are NOT DETECTED.  The SARS-CoV-2 RNA is generally detectable in upper respiratory specimens during the acute phase of infection. The lowest concentration of SARS-CoV-2 viral copies this assay can detect is 138 copies/mL. A negative result does not preclude SARS-Cov-2 infection and should not be used as the sole basis for treatment or other patient management decisions. A negative result may occur with  improper specimen collection/handling, submission of specimen other than nasopharyngeal swab, presence of viral mutation(s) within the areas targeted by this assay, and inadequate number of viral copies(<138 copies/mL). A negative result must be combined with clinical observations, patient history, and epidemiological information. The expected result is Negative.  Fact Sheet for Patients:  BloggerCourse.com  Fact Sheet for Healthcare Providers:  SeriousBroker.it  This test is no t yet approved or cleared by the [place] FDA and  has been authorized for detection and/or diagnosis of SARS-CoV-2 by FDA under an Emergency Use Authorization (EUA). This  EUA will remain  in effect (meaning this test can be used) for the duration of the COVID-19 declaration under Section 564(b)(1) of the Act, 21 U.S.C.section 360bbb-3(b)(1), unless the authorization is terminated  or revoked sooner.       Influenza A by PCR NEGATIVE NEGATIVE Final   Influenza B  by PCR NEGATIVE NEGATIVE Final    Comment: (NOTE) The Xpert Xpress SARS-CoV-2/FLU/RSV plus assay is intended as an aid in the diagnosis of influenza from Nasopharyngeal swab specimens and should not be used as a sole basis for treatment. Nasal washings and aspirates are unacceptable for Xpert Xpress SARS-CoV-2/FLU/RSV testing.  Fact Sheet for Patients: BloggerCourse.com  Fact Sheet for Healthcare Providers: SeriousBroker.it  This test is not yet approved or cleared by the [place] FDA and has been authorized for detection and/or diagnosis of SARS-CoV-2 by FDA under an Emergency Use Authorization (EUA). This EUA will remain in effect (meaning this test can be used) for the duration of the COVID-19 declaration under Section 564(b)(1) of the Act, 21 U.S.C. section 360bbb-3(b)(1), unless the authorization is terminated or revoked.     Resp Syncytial Virus by PCR NEGATIVE NEGATIVE Final    Comment: (NOTE) Fact Sheet for Patients: BloggerCourse.com  Fact Sheet for Healthcare Providers: SeriousBroker.it  This test is not yet approved or cleared by the [place] FDA and has been authorized for detection and/or diagnosis of SARS-CoV-2 by FDA under an Emergency Use Authorization (EUA). This EUA will remain in effect (meaning this test can be used) for the duration of the COVID-19 declaration under Section 564(b)(1) of the Act, 21 U.S.C. section 360bbb-3(b)(1), unless the authorization is terminated or revoked.  Performed at [facility], [address] [place], [place]  [address]   Culture, blood (Routine X 2) w Reflex to ID Panel     Status: None (Preliminary result)   Collection Time: 05/10/22  1:07 AM   Specimen: BLOOD  Result Value Ref Range Status   Specimen Description BLOOD BLOOD LEFT HAND  Final   Special Requests Blood Culture adequate volume  Final   Culture   Final    NO GROWTH 2 DAYS Performed at [facility], [address] [place], [place] [address]    Report Status PENDING  Incomplete  Culture, blood (Routine X 2) w Reflex to ID Panel     Status: None (Preliminary result)   Collection Time: 05/10/22  1:30 AM   Specimen: BLOOD  Result Value Ref Range Status   Specimen Description BLOOD BLOOD RIGHT HAND  Final   Special Requests   Final    BOTTLES DRAWN AEROBIC AND ANAEROBIC Blood Culture adequate volume   Culture   Final    NO GROWTH 2 DAYS Performed at [facility], [address], [place], [place] [address]    Report Status PENDING  Incomplete  MRSA Next Gen by PCR, Nasal     Status: None   Collection Time: 05/10/22  6:33 PM   Specimen: Nasal Mucosa; Nasal Swab  Result Value Ref Range Status   MRSA by PCR Next Gen NOT DETECTED NOT DETECTED Final    Comment: (NOTE) The GeneXpert MRSA Assay (FDA approved for NASAL specimens only), is one component of a comprehensive MRSA colonization surveillance program. It is not intended to diagnose MRSA infection nor to guide or monitor treatment for MRSA infections. Test performance is not FDA approved in patients less than 13 years old. Performed at [facility], [address] [place], [place] [address]     Labs: CBC: Recent Labs  Lab 05/09/22 2246 05/10/22 0455 05/11/22 0410 05/12/22 0409  WBC 9.7 12.7* 10.6* 7.8  HGB 14.0 12.1 11.6* 13.0  HCT 45.1 37.7 36.4 40.6  MCV 86.1 85.9 85.2 85.1  PLT 282 210 221 241   Basic Metabolic Panel:  Recent Labs  Lab 05/09/22 2246 05/10/22 0455 05/11/22 0410 05/12/22 0409 05/13/22 0434  NA 136 137 139 137  140  K 3.8 4.2 2.9* 3.3* 3.2*  CL 103 101 97* 97* 101  CO2 24 27 34* 30 30  GLUCOSE 119* 188* 91 86 83  BUN 17 17 20 22 17   CREATININE 1.07* 1.01* 0.93 0.97 0.77  CALCIUM 8.1* 7.8* 7.6* 8.0* 7.9*   Liver Function Tests: Recent Labs  Lab 05/09/22 2246  AST 27  ALT 16  ALKPHOS 127*  BILITOT 1.2  PROT 6.5  ALBUMIN 2.7*   CBG: Recent Labs  Lab 05/12/22 1446  GLUCAP 115*    Discharge time spent: greater than 30 minutes.  This record has been created using Conservation officer, historic buildings. Errors have been sought and corrected,but may not always be located. Such creation errors do not reflect on the standard of care.   Signed: Arnetha Courser, MD [facility] 05/13/2022

## 2022-05-12 NOTE — TOC Progression Note (Addendum)
Transition of Care [facility]) - Progression Note    Patient Details  Name: JOLETTA MANNER MRN: [id number] Date of Birth: 1926-09-18  Transition of Care [facility]) CM/SW Contact  Truddie Hidden, RN Phone Number: 05/12/2022, 3:46 PM  Clinical Narrative:    Attempt to contact Sarah at [facility].  Left a message regarding postponed discharged.      Barriers to Discharge: Barriers Resolved  Expected Discharge Plan and Services         Expected Discharge Date: 05/12/22                         HH Arranged: PT HH Agency: [facility] Date HH Agency Contacted: 05/12/22 Time HH Agency Contacted: 1359 Representative spoke with at [facility] Agency: [place]   Social Determinants of Health (SDOH) Interventions SDOH Screenings   Food Insecurity: No Food Insecurity (05/10/2022)  Housing: Low Risk  (05/10/2022)  Transportation Needs: No Transportation Needs (05/10/2022)  Utilities: Not At Risk (05/10/2022)  Tobacco Use: Low Risk  (05/10/2022)    Readmission Risk Interventions     No data to display

## 2022-05-12 NOTE — Significant Event (Signed)
Patient was stable and discharged back to her facility.  While they were ruling her out in a wheelchair she suddenly syncopized, her only complaint was being sick in her stomach.  She lost consciousness, some abnormal jerking movements of upper body was also noted.  We lost her pulse for couple of minutes. Patient was placed back in the bed and CODE BLUE was activated.  No chest compressions done as patient was DNR.  She regained consciousness and pulse spontaneously. Denies any chest pain but appears very dusky.  Continues to have some nausea but no vomiting.  Vitals were stable at that time.  Informed cardiology again for reevaluation. EKG ordered.  Patient will be staying in hospital for at least another day.

## 2022-05-12 NOTE — [place] FL2 (Signed)
[place] MEDICAID FL2 LEVEL OF CARE FORM     IDENTIFICATION  Patient Name: Vanessa Logan Birthdate: February 29, 1926 Sex: female Admission Date (Current Location): 05/09/2022  [facility] and [place] Number:  Chiropodist and Address:  [facility], [address], [place], [place] [address]      Provider Number: [id number]  Attending Physician Name and Address:  Arnetha Courser, MD  Relative Name and Phone Number:  Wells Guiles (Daughter) [phone] (Mobile)    Current Level of Care: Hospital Recommended Level of Care: Assisted Living Facility Prior Approval Number:    Date Approved/Denied:   PASRR Number:    Discharge Plan: Home    Current Diagnoses: Patient Active Problem List   Diagnosis Date Noted   Hypokalemia 05/11/2022   Pressure injury of skin 05/11/2022   Acute respiratory failure with hypoxia 05/10/2022   Acute heart failure 05/10/2022   Hypertensive emergency 05/10/2022   Stage 3a chronic kidney disease 05/10/2022   Acute pulmonary edema 05/10/2022   COPD exacerbation 05/10/2022   E coli bacteremia 02/12/2021   Dementia without behavioral disturbance 02/05/2021   Fall at home, initial encounter 02/04/2021   Acute respiratory failure 02/04/2021   AKI (acute kidney injury) 02/04/2021   Metabolic acidosis 02/04/2021   Hyperkalemia 02/04/2021   Right hip pain 02/04/2021   Rapid atrial fibrillation 02/04/2021   Multifocal pneumonia 02/04/2021   Sepsis 02/04/2021   Severe sepsis 02/04/2021   Acute UTI 07/04/2017   Fibrocystic breast disease 06/29/2012   Family history of malignant neoplasm of breast     Orientation RESPIRATION BLADDER Height & Weight     Self, Place  Normal External catheter Weight: 70 kg Height:   (175.3 cm)  BEHAVIORAL SYMPTOMS/MOOD NEUROLOGICAL BOWEL NUTRITION STATUS  Other (Comment) (n/a)  (n/a) Incontinent Diet (Heart)  AMBULATORY STATUS COMMUNICATION OF NEEDS Skin   Limited Assist Verbally   (Pressure Injury 05/11/22 Sacrum Stage 1 - Intact skin with non-blanchable redness of a localized area usually over a bony prominence)                       Personal Care Assistance Level of Assistance  Bathing, Dressing, Feeding Bathing Assistance: Limited assistance Feeding assistance: Limited assistance Dressing Assistance: Limited assistance     Functional Limitations Info  Sight, Hearing Sight Info: Impaired Hearing Info: Impaired      SPECIAL CARE FACTORS FREQUENCY  PT (By licensed PT)     PT Frequency: Min 2x weekly              Contractures Contractures Info: Not present    Additional Factors Info  Code Status, Allergies Code Status Info: DNR Allergies Info: Neosporin (Neomycin-bacitracin Zn-polymyx), Keflex (Cephalexin)           Current Medications (05/12/2022):  This is the current hospital active medication list Current Facility-Administered Medications  Medication Dose Route Frequency Provider Last Rate Last Admin   acetaminophen (TYLENOL) tablet 650 mg  650 mg Oral Q4H PRN Andris Baumann, MD       amoxicillin-clavulanate (AUGMENTIN) 875-125 MG per tablet 1 tablet  1 tablet Oral Q12H Arnetha Courser, MD       apixaban (ELIQUIS) tablet 5 mg  5 mg Oral BID Lindajo Royal V, MD   5 mg at 05/12/22 1018   azithromycin (ZITHROMAX) 500 mg in sodium chloride 0.9 % 250 mL IVPB  500 mg Intravenous Q24H Andris Baumann, MD 250 mL/hr at 05/12/22 0943 Infusion Verify at  05/12/22 0943   furosemide (LASIX) tablet 40 mg  40 mg Oral Daily Rebeca Allegra, PA-C   40 mg at 05/12/22 1019   metoprolol succinate (TOPROL-XL) 24 hr tablet 12.5 mg  12.5 mg Oral BID Rebeca Allegra, PA-C   12.5 mg at 05/12/22 1018   midodrine (PROAMATINE) tablet 5 mg  5 mg Oral TID WC PRN Callwood, Dwayne D, MD       ondansetron (ZOFRAN) injection 4 mg  4 mg Intravenous Q6H PRN Andris Baumann, MD         Discharge Medications: Please see discharge summary for a list of discharge  medications.  Relevant Imaging Results:  Relevant Lab Results:   Additional Information [id number]  Truddie Hidden, RN

## 2022-05-12 NOTE — TOC Progression Note (Signed)
Transition of Care [facility]) - Progression Note    Patient Details  Name: DACIA CAPERS MRN: [id number] Date of Birth: 1926/12/19  Transition of Care [facility]) CM/SW Contact  Truddie Hidden, RN Phone Number: 05/12/2022, 1:54 PM  Clinical Narrative:    Patient discharging home.  Spoke with Maralyn Sago from [facility] rec for PT.  Maralyn Sago stated facility uses [facility] Patient referral sent and accepted by [place] from [facility]. MD notified for order. FL2 and discharge summary faxed to [phone].  Patient daughter will transport patient to the facility.   TOC signing off.       Expected Discharge Plan and Services         Expected Discharge Date: 05/12/22                                     Social Determinants of Health (SDOH) Interventions SDOH Screenings   Food Insecurity: No Food Insecurity (05/10/2022)  Housing: Low Risk  (05/10/2022)  Transportation Needs: No Transportation Needs (05/10/2022)  Utilities: Not At Risk (05/10/2022)  Tobacco Use: Low Risk  (05/10/2022)    Readmission Risk Interventions     No data to display

## 2022-05-13 DIAGNOSIS — I5031 Acute diastolic (congestive) heart failure: Secondary | ICD-10-CM | POA: Diagnosis not present

## 2022-05-13 LAB — BASIC METABOLIC PANEL
Anion gap: 9 (ref 5–15)
BUN: 17 mg/dL (ref 8–23)
CO2: 30 mmol/L (ref 22–32)
Calcium: 7.9 mg/dL — ABNORMAL LOW (ref 8.9–10.3)
Chloride: 101 mmol/L (ref 98–111)
Creatinine, Ser: 0.77 mg/dL (ref 0.44–1.00)
GFR, Estimated: >60 mL/min (ref >60)
Glucose, Bld: 83 mg/dL (ref 70–99)
Potassium: 3.2 mmol/L — ABNORMAL LOW (ref 3.5–5.1)
Sodium: 140 mmol/L (ref 135–145)

## 2022-05-13 LAB — CULTURE, BLOOD (ROUTINE X 2)

## 2022-05-13 MED ORDER — POTASSIUM CHLORIDE CRYS ER 20 MEQ PO TBCR
40.0000 meq | EXTENDED_RELEASE_TABLET | Freq: Once | ORAL | Status: AC
  Administered 2022-05-13: 40 meq via ORAL
  Filled 2022-05-13: qty 2

## 2022-05-13 NOTE — TOC Transition Note (Signed)
Transition of Care [facility]) - CM/SW Discharge Note   Patient Details  Name: Vanessa Logan MRN: [id number] Date of Birth: 1926/11/20  Transition of Care [facility]) CM/SW Contact:  Truddie Hidden, RN Phone Number: 05/13/2022, 11:58 AM   Clinical Narrative:    Spoke with Sarah at [facility] to advise of admission today.  Spoke with patient's daughter, [name] to advised of discharge today. [name] is requesting EMS transport and has been advised of potential bill for EMS transport.  FL2 and discharge summary faxed to [phone].  Patient EMS arranged  She has been assigned to room # 115 Nurse will call report to [phone].   TOC signing off.    Final next level of care: Home w Home Health Services Barriers to Discharge: Barriers Resolved   Patient Goals and CMS Choice      Discharge Placement                         Discharge Plan and Services Additional resources added to the After Visit Summary for                            [facility] Arranged: PT HH Agency: [facility] Date [facility] Agency Contacted: 05/12/22 Time HH Agency Contacted: 1359 Representative spoke with at [facility] Agency: [place]  Social Determinants of Health (SDOH) Interventions SDOH Screenings   Food Insecurity: No Food Insecurity (05/10/2022)  Housing: Low Risk  (05/10/2022)  Transportation Needs: No Transportation Needs (05/10/2022)  Utilities: Not At Risk (05/10/2022)  Tobacco Use: Low Risk  (05/10/2022)     Readmission Risk Interventions     No data to display

## 2022-05-13 NOTE — Progress Notes (Signed)
LMOVM for daughter [name].  Reports called to [facility].

## 2022-05-13 NOTE — NC FL2 (Signed)
[place] MEDICAID FL2 LEVEL OF CARE FORM     IDENTIFICATION  Patient Name: Vanessa Logan Birthdate: Aug 17, 1926 Sex: female Admission Date (Current Location): 05/09/2022  [facility] and [place] Number:  Chiropodist and Address:  [facility], [address] [place], [place] [address]      Provider Number: [id number]  Attending Physician Name and Address:  Arnetha Courser, MD  Relative Name and Phone Number:  Wells Guiles (Daughter) [phone] (Mobile)    Current Level of Care: Hospital Recommended Level of Care: Assisted Living Facility Prior Approval Number:    Date Approved/Denied:   PASRR Number:    Discharge Plan: Home    Current Diagnoses: Patient Active Problem List   Diagnosis Date Noted   Hypokalemia 05/11/2022   Pressure injury of skin 05/11/2022   Acute respiratory failure with hypoxia 05/10/2022   Acute heart failure 05/10/2022   Hypertensive emergency 05/10/2022   Stage 3a chronic kidney disease 05/10/2022   Acute pulmonary edema 05/10/2022   COPD exacerbation 05/10/2022   E coli bacteremia 02/12/2021   Dementia without behavioral disturbance 02/05/2021   Fall at home, initial encounter 02/04/2021   Acute respiratory failure 02/04/2021   AKI (acute kidney injury) 02/04/2021   Metabolic acidosis 02/04/2021   Hyperkalemia 02/04/2021   Right hip pain 02/04/2021   Rapid atrial fibrillation 02/04/2021   Multifocal pneumonia 02/04/2021   Sepsis 02/04/2021   Severe sepsis 02/04/2021   Acute UTI 07/04/2017   Fibrocystic breast disease 06/29/2012   Family history of malignant neoplasm of breast     Orientation RESPIRATION BLADDER Height & Weight     Self, Place  Normal External catheter Weight: 70.1 kg Height:   (175.3 cm)  BEHAVIORAL SYMPTOMS/MOOD NEUROLOGICAL BOWEL NUTRITION STATUS  Other (Comment) (n/a)  (n/a) Incontinent Diet (Heart)  AMBULATORY STATUS COMMUNICATION OF NEEDS Skin   Limited Assist Verbally   (Pressure Injury 05/11/22 Sacrum Stage 1 - Intact skin with non-blanchable redness of a localized area usually over a bony prominence)                       Personal Care Assistance Level of Assistance  Bathing, Dressing, Feeding Bathing Assistance: Limited assistance Feeding assistance: Limited assistance Dressing Assistance: Limited assistance     Functional Limitations Info  Sight, Hearing Sight Info: Impaired Hearing Info: Impaired      SPECIAL CARE FACTORS FREQUENCY  PT (By licensed PT)     PT Frequency: Min 2x weekly              Contractures Contractures Info: Not present    Additional Factors Info  Code Status, Allergies Code Status Info: DNR Allergies Info: Neosporin (Neomycin-bacitracin Zn-polymyx), Keflex (Cephalexin)           Current Medications (05/13/2022):  This is the current hospital active medication list TAKE these medications     acetaminophen 500 MG tablet Commonly known as: TYLENOL Take 500 mg by mouth every 6 (six) hours as needed for moderate pain or mild pain.    amoxicillin-clavulanate 875-125 MG tablet Commonly known as: AUGMENTIN Take 1 tablet by mouth every 12 (twelve) hours for 4 doses.    apixaban 5 MG Tabs tablet Commonly known as: ELIQUIS Take 5 mg by mouth 2 (two) times daily.    benzonatate 200 MG capsule Commonly known as: TESSALON Take 200 mg by mouth every 8 (eight) hours as needed for cough.    furosemide 40 MG tablet Commonly known as:  LASIX Take 40 mg by mouth daily.    loperamide 2 MG capsule Commonly known as: IMODIUM Take 2 mg by mouth daily.    LORazepam 0.5 MG tablet Commonly known as: ATIVAN Take 0.5 mg by mouth every 8 (eight) hours as needed for anxiety.    Magnesium 250 MG Tabs Take 250 mg by mouth 2 (two) times daily.    metoprolol succinate 25 MG 24 hr tablet Commonly known as: TOPROL-XL Take 25 mg by mouth at bedtime.    midodrine 5 MG tablet Commonly known as: PROAMATINE Take 1  tablet (5 mg total) by mouth 3 (three) times daily with meals as needed (Systolic blood pressure less than 100).    ondansetron 4 MG tablet Commonly known as: ZOFRAN Take 4 mg by mouth every 8 (eight) hours as needed for nausea or vomiting.    oxybutynin 10 MG 24 hr tablet Commonly known as: DITROPAN-XL Take 10 mg by mouth 2 (two) times daily.    pantoprazole 40 MG tablet Commonly known as: Protonix Take 1 tablet (40 mg total) by mouth daily.    Potassium Chloride ER 20 MEQ Tbcr Take 20 mEq by mouth 4 (four) times daily.    triamcinolone cream 0.5 % Commonly known as: KENALOG Apply 1 Application topically 2 (two) times daily. Apply topically to legs twice a day for rash.     Discharge Medications: Please see discharge summary for a list of discharge medications.  Relevant Imaging Results:  Relevant Lab Results:   Additional Information SSN:[id number]  Truddie Hidden, RN

## 2022-05-14 LAB — CULTURE, BLOOD (ROUTINE X 2): Culture: NO GROWTH

## 2022-05-15 LAB — CULTURE, BLOOD (ROUTINE X 2): Special Requests: ADEQUATE

## 2022-08-07 ENCOUNTER — Other Ambulatory Visit: Payer: Self-pay

## 2022-08-07 ENCOUNTER — Emergency Department
Admission: EM | Admit: 2022-08-07 | Discharge: 2022-08-07 | Disposition: A | Discharge status: Skilled Nursing Facility | Payer: Medicare Other | Attending: Emergency Medicine | Admitting: Emergency Medicine

## 2022-08-07 DIAGNOSIS — Z7901 Long term (current) use of anticoagulants: Secondary | ICD-10-CM | POA: Insufficient documentation

## 2022-08-07 DIAGNOSIS — R110 Nausea: Secondary | ICD-10-CM | POA: Diagnosis not present

## 2022-08-07 DIAGNOSIS — F0390 Unspecified dementia without behavioral disturbance: Secondary | ICD-10-CM | POA: Insufficient documentation

## 2022-08-07 DIAGNOSIS — I10 Essential (primary) hypertension: Secondary | ICD-10-CM | POA: Diagnosis not present

## 2022-08-07 DIAGNOSIS — R5383 Other fatigue: Secondary | ICD-10-CM | POA: Diagnosis not present

## 2022-08-07 LAB — CBC
HCT: 41.9 % (ref 36.0–46.0)
Hemoglobin: 13.1 g/dL (ref 12.0–15.0)
MCH: 27 pg (ref 26.0–34.0)
MCHC: 31.3 g/dL (ref 30.0–36.0)
MCV: 86.4 fL (ref 80.0–100.0)
Platelets: 231 10*3/uL (ref 150–400)
RBC: 4.85 MIL/uL (ref 3.87–5.11)
RDW: 13.8 % (ref 11.5–15.5)
WBC: 6.1 10*3/uL (ref 4.0–10.5)
nRBC: 0 % (ref 0.0–0.2)

## 2022-08-07 LAB — BASIC METABOLIC PANEL
Anion gap: 10 (ref 5–15)
BUN: 26 mg/dL — ABNORMAL HIGH (ref 8–23)
CO2: 22 mmol/L (ref 22–32)
Calcium: 8 mg/dL — ABNORMAL LOW (ref 8.9–10.3)
Chloride: 103 mmol/L (ref 98–111)
Creatinine, Ser: 1.01 mg/dL — ABNORMAL HIGH (ref 0.44–1.00)
GFR, Estimated: 52 mL/min — ABNORMAL LOW (ref >60)
Glucose, Bld: 153 mg/dL — ABNORMAL HIGH (ref 70–99)
Potassium: 3.8 mmol/L (ref 3.5–5.1)
Sodium: 135 mmol/L (ref 135–145)

## 2022-08-07 NOTE — ED Triage Notes (Signed)
Pt to ED from [facility] via [facility] for generalized weakness, fatigue, and nausea around 12pm today. Denies vomiting, diarrhea. Pt alone in triage.   Speech clear, alert and oriented. Head bent forward in wheelchair.   Temperature not reading orally or axillary in triage.

## 2022-08-07 NOTE — ED Notes (Signed)
called to [facility] for transport to [facility]/rep:madelyn.Marland Kitchen

## 2022-08-07 NOTE — ED Provider Notes (Signed)
[facility] Provider Note    Event Date/Time   First MD Initiated Contact with Patient 08/07/22 2117     (approximate)   History   Chief Complaint: Weakness and Nausea   HPI  Vanessa Logan is a 89 y.o. female with a history of hypertension, atrial fibrillation on Eliquis, dementia who was brought to the ED from home placed due to generalized weakness fatigue and nausea that started at lunchtime.  Patient reports that she was just sitting down at the table to eat, and she started to feel unwell.  She decided to return to her room and she felt really tired.  Denies any pain or shortness of breath, no fever vomiting diarrhea.  No dizziness or syncope.  No trouble with vision, no motor weakness or loss of sensation.  No headaches.  Symptoms lasted for short while and have now resolved.  She feels back to normal and denies any concerns.  Family reported that she is back to her baseline.     Physical Exam   Triage Vital Signs: ED Triage Vitals  Encounter Vitals Group     BP 08/07/22 1747 110/78     Systolic BP Percentile --      Diastolic BP Percentile --      Pulse Rate 08/07/22 1747 84     Resp 08/07/22 1747 20     Temp 08/07/22 1823 97.6 F (36.4 C)     Temp Source 08/07/22 1747 Axillary     SpO2 08/07/22 1747 94 %     Weight 08/07/22 1750 150 lb (68 kg)     Height 08/07/22 1750 6' (1.829 m)     Head Circumference --      Peak Flow --      Pain Score 08/07/22 1750 0     Pain Loc --      Pain Education --      Exclude from Growth Chart --     Most recent vital signs: Vitals:   08/07/22 1823 08/07/22 2030  BP:  (!) 129/95  Pulse:  84  Resp:  (!) 21  Temp: 97.6 F (36.4 C) 97.8 F (36.6 C)  SpO2:  94%    General: Awake, no distress.  CV:  Good peripheral perfusion.  Regular rate.  Normal distal pulses Resp:  Normal effort.  Clear to auscultation bilaterally Abd:  No distention.  Soft nontender Other:  Trace pitting edema bilateral lower  extremities.  Symmetric calf circumference.   ED Results / Procedures / Treatments   Labs (all labs ordered are listed, but only abnormal results are displayed) Labs Reviewed  BASIC METABOLIC PANEL - Abnormal; Notable for the following components:      Result Value   Glucose, Bld 153 (*)    BUN 26 (*)    Creatinine, Ser 1.01 (*)    Calcium 8.0 (*)    GFR, Estimated 52 (*)    All other components within normal limits  CBC  URINALYSIS, ROUTINE W REFLEX MICROSCOPIC  CBG MONITORING, ED     EKG    RADIOLOGY    PROCEDURES:  Procedures   MEDICATIONS ORDERED IN ED: Medications - No data to display   IMPRESSION / MDM / ASSESSMENT AND PLAN / ED COURSE  I reviewed the triage vital signs and the nursing notes.  DDx: Dehydration, anemia, electrolyte abnormality, AKI  Patient's presentation is most consistent with acute presentation with potential threat to life or bodily function.  Patient comes to  the ED due to an episode of nausea and feeling fatigued.  That is since resolved and she is back to baseline.  Vital signs are normal, exam is benign and reassuring.  Serum labs unremarkable.  Stable for discharge home.  Doubt ACS PE dissection AAA pneumothorax stroke intracranial hemorrhage meningitis.  No evidence of infection.  No signs of trauma.       FINAL CLINICAL IMPRESSION(S) / ED DIAGNOSES   Final diagnoses:  Nausea     Rx / DC Orders   ED Discharge Orders     None        Note:  This document was prepared using Dragon voice recognition software and may include unintentional dictation errors.   Sharman Cheek, MD 08/07/22 2253

## 2022-08-07 NOTE — ED Notes (Signed)
As patient is placed in her room, she states that she feels better now, and that whatever was going on with her earlier has passed.  She was not strong enough however to stand on her own and get from the wheelchair to the bed.  Assistance was required to get her upright, and pivoted over to the stretcher.

## 2022-08-07 NOTE — ED Notes (Signed)
This nurse spoke with the daughter of the patient who indicated that this level of weakness is the normal baseline for the patient.  The patient is unable to walk, and has been this way for months now.  She went on to say that the patient has an attendant who cares for her at [facility].  Provider has been advised.

## 2022-08-07 NOTE — ED Triage Notes (Signed)
First nurse: [facility] from [facility] via [facility] c/o weakness, fatigue, and nausea.

## 2022-12-09 ENCOUNTER — Other Ambulatory Visit: Payer: Self-pay

## 2022-12-09 ENCOUNTER — Emergency Department (HOSPITAL_COMMUNITY): Payer: Medicare Other

## 2022-12-09 ENCOUNTER — Emergency Department (HOSPITAL_BASED_OUTPATIENT_CLINIC_OR_DEPARTMENT_OTHER)
Admit: 2022-12-09 | Discharge: 2022-12-09 | Disposition: A | Discharge status: Home / Self Care | Payer: Medicare Other | Attending: Emergency Medicine | Admitting: Emergency Medicine

## 2022-12-09 ENCOUNTER — Emergency Department (HOSPITAL_COMMUNITY)
Admission: EM | Admit: 2022-12-09 | Discharge: 2022-12-09 | Disposition: A | Discharge status: Home / Self Care | Payer: Medicare Other | Attending: Emergency Medicine | Admitting: Emergency Medicine

## 2022-12-09 DIAGNOSIS — Z7901 Long term (current) use of anticoagulants: Secondary | ICD-10-CM | POA: Insufficient documentation

## 2022-12-09 DIAGNOSIS — R0602 Shortness of breath: Secondary | ICD-10-CM | POA: Diagnosis not present

## 2022-12-09 DIAGNOSIS — I509 Heart failure, unspecified: Secondary | ICD-10-CM | POA: Diagnosis not present

## 2022-12-09 DIAGNOSIS — M79661 Pain in right lower leg: Secondary | ICD-10-CM

## 2022-12-09 DIAGNOSIS — M1711 Unilateral primary osteoarthritis, right knee: Secondary | ICD-10-CM | POA: Diagnosis not present

## 2022-12-09 DIAGNOSIS — Z79899 Other long term (current) drug therapy: Secondary | ICD-10-CM | POA: Diagnosis not present

## 2022-12-09 DIAGNOSIS — M25561 Pain in right knee: Secondary | ICD-10-CM | POA: Diagnosis present

## 2022-12-09 DIAGNOSIS — I4891 Unspecified atrial fibrillation: Secondary | ICD-10-CM | POA: Diagnosis not present

## 2022-12-09 DIAGNOSIS — Z20822 Contact with and (suspected) exposure to covid-19: Secondary | ICD-10-CM | POA: Insufficient documentation

## 2022-12-09 DIAGNOSIS — F0390 Unspecified dementia without behavioral disturbance: Secondary | ICD-10-CM | POA: Diagnosis not present

## 2022-12-09 LAB — BASIC METABOLIC PANEL
Anion gap: 11 (ref 5–15)
BUN: 36 mg/dL — ABNORMAL HIGH (ref 8–23)
CO2: 22 mmol/L (ref 22–32)
Calcium: 8.2 mg/dL — ABNORMAL LOW (ref 8.9–10.3)
Chloride: 98 mmol/L (ref 98–111)
Creatinine, Ser: 1.12 mg/dL — ABNORMAL HIGH (ref 0.44–1.00)
GFR, Estimated: 46 mL/min — ABNORMAL LOW (ref >60)
Glucose, Bld: 92 mg/dL (ref 70–99)
Potassium: 4.3 mmol/L (ref 3.5–5.1)
Sodium: 131 mmol/L — ABNORMAL LOW (ref 135–145)

## 2022-12-09 LAB — CBC WITH DIFFERENTIAL/PLATELET
Abs Immature Granulocytes: 0.03 10*3/uL (ref 0.00–0.07)
Basophils Absolute: 0 10*3/uL (ref 0.0–0.1)
Basophils Relative: 0 %
Eosinophils Absolute: 0 10*3/uL (ref 0.0–0.5)
Eosinophils Relative: 0 %
HCT: 42.1 % (ref 36.0–46.0)
Hemoglobin: 12.9 g/dL (ref 12.0–15.0)
Immature Granulocytes: 0 %
Lymphocytes Relative: 14 %
Lymphs Abs: 1 10*3/uL (ref 0.7–4.0)
MCH: 28 pg (ref 26.0–34.0)
MCHC: 30.6 g/dL (ref 30.0–36.0)
MCV: 91.5 fL (ref 80.0–100.0)
Monocytes Absolute: 0.8 10*3/uL (ref 0.1–1.0)
Monocytes Relative: 12 %
Neutro Abs: 5.1 10*3/uL (ref 1.7–7.7)
Neutrophils Relative %: 74 %
Platelets: 226 10*3/uL (ref 150–400)
RBC: 4.6 MIL/uL (ref 3.87–5.11)
RDW: 14.2 % (ref 11.5–15.5)
WBC: 6.9 10*3/uL (ref 4.0–10.5)
nRBC: 0 % (ref 0.0–0.2)

## 2022-12-09 LAB — RESP PANEL BY RT-PCR (RSV, FLU A&B, COVID)  RVPGX2
Influenza A by PCR: NEGATIVE
Influenza B by PCR: NEGATIVE
Resp Syncytial Virus by PCR: NEGATIVE
SARS Coronavirus 2 by RT PCR: NEGATIVE

## 2022-12-09 LAB — TROPONIN I (HIGH SENSITIVITY)
Troponin I (High Sensitivity): 18 ng/L — ABNORMAL HIGH (ref <18)
Troponin I (High Sensitivity): 16 ng/L (ref <18)

## 2022-12-09 LAB — BRAIN NATRIURETIC PEPTIDE: B Natriuretic Peptide: 172.7 pg/mL — ABNORMAL HIGH (ref 0.0–100.0)

## 2022-12-09 MED ORDER — ACETAMINOPHEN 500 MG PO TABS
1000.0000 mg | ORAL_TABLET | Freq: Once | ORAL | Status: AC
  Administered 2022-12-09: 1000 mg via ORAL
  Filled 2022-12-09: qty 2

## 2022-12-09 NOTE — ED Notes (Signed)
Report given to John at [facility] [facility]

## 2022-12-09 NOTE — Progress Notes (Signed)
Right lower extremity venous duplex has been completed. Preliminary results can be found in CV Proc through chart review.  Results were given to Dr. Jacqulyn Bath.  12/09/22 4:25 PM Olen Cordial RVT

## 2022-12-09 NOTE — ED Provider Notes (Signed)
[place] EMERGENCY DEPARTMENT AT [facility] Provider Note   CSN: [id number] Arrival date & time: 12/09/22  1501     History  Chief Complaint  Patient presents with   Shortness of Breath    Vanessa Logan is a 89 y.o. female.  Presenting today from [facility] and found to have low O2 sat, cyanosis around her mouth and nailbeds and voicing concerns of a smothering feeling and right knee pain.  Daughter and patient act as historian.  Patient notes that she felt short of breath today and this was worse when she was trying to drink or have conversation.  She does not feel short of breath at this time.  Denies chest pain/pressure.  She does not have any significant tobacco use history although significant secondhand smoke exposure from her husband.  She denies any sick symptoms of fever, cough.  She does not require oxygen in her daily life.  Secondarily, she mentions right knee pain.  She spends most of her time in a wheelchair.  Daughter notes that she had her right knee x-rayed a few days ago which showed arthritis.  Denies recent trauma.  She takes her medication as prescribed.  She has a medical history of hypotension on midodrine, A-fib on Eliquis, CHF on Lasix, GERD, dementia.  Her CODE STATUS is DNR.   Shortness of Breath Associated symptoms: no chest pain, no cough and no fever        Home Medications Prior to Admission medications   Medication Sig Start Date End Date Taking? Authorizing Provider  acetaminophen (TYLENOL) 500 MG tablet Take 500 mg by mouth every 6 (six) hours as needed for moderate pain or mild pain.    [provider]  apixaban (ELIQUIS) 5 MG TABS tablet Take 5 mg by mouth 2 (two) times daily.    [provider]  benzonatate (TESSALON) 200 MG capsule Take 200 mg by mouth every 8 (eight) hours as needed for cough. 01/28/22   [provider]  furosemide (LASIX) 40 MG tablet Take 40 mg by mouth daily. 05/04/22 05/04/23   [provider]  loperamide (IMODIUM) 2 MG capsule Take 2 mg by mouth daily. 09/23/21   [provider]  LORazepam (ATIVAN) 0.5 MG tablet Take 0.5 mg by mouth every 8 (eight) hours as needed for anxiety. 11/24/21   [provider]  Magnesium 250 MG TABS Take 250 mg by mouth 2 (two) times daily.    [provider]  metoprolol succinate (TOPROL-XL) 25 MG 24 hr tablet Take 25 mg by mouth at bedtime.    [provider]  midodrine (PROAMATINE) 5 MG tablet Take 1 tablet (5 mg total) by mouth 3 (three) times daily with meals as needed (Systolic blood pressure less than 100). 05/12/22   Arnetha Courser, MD  ondansetron (ZOFRAN) 4 MG tablet Take 4 mg by mouth every 8 (eight) hours as needed for nausea or vomiting.    [provider]  oxybutynin (DITROPAN-XL) 10 MG 24 hr tablet Take 10 mg by mouth 2 (two) times daily. 08/26/14   [provider]  pantoprazole (PROTONIX) 40 MG tablet Take 1 tablet (40 mg total) by mouth daily. 02/12/21   Lurene Shadow, MD  Potassium Chloride ER 20 MEQ TBCR Take 20 mEq by mouth 4 (four) times daily. 11/15/20   [provider]  triamcinolone cream (KENALOG) 0.5 % Apply 1 Application topically 2 (two) times daily. Apply topically to legs twice a day for rash.  03/04/22 03/04/23  [provider]      Allergies    Neosporin [neomycin-bacitracin zn-polymyx] and Keflex [cephalexin]    Review of Systems   Review of Systems  Constitutional:  Negative for chills, fatigue and fever.  Respiratory:  Positive for shortness of breath. Negative for cough.   Cardiovascular:  Negative for chest pain.   Physical Exam Updated Vital Signs BP (!) 138/95   Pulse (!) 103   Temp (!) 97.4 F (36.3 C) (Oral)   Resp (!) 24   Ht 5\' 10"  (1.778 m)   Wt 64 kg   SpO2 97%   BMI 20.23 kg/m  Physical Exam Constitutional:      General: She is not in acute distress.    Appearance: She is not toxic-appearing.  Cardiovascular:      Rate and Rhythm: Rhythm irregular.     Heart sounds: Normal heart sounds.  Pulmonary:     Effort: Pulmonary effort is normal.     Breath sounds: Normal breath sounds. No decreased breath sounds.     Comments: On 2 L [place] Musculoskeletal:     Right knee: Swelling present. Decreased range of motion. Tenderness present.     Right lower leg: No edema.     Left lower leg: No edema.  Neurological:     General: No focal deficit present.     Mental Status: She is oriented to person, place, and time.    ED Results / Procedures / Treatments   Labs (all labs ordered are listed, but only abnormal results are displayed) Labs Reviewed  BASIC METABOLIC PANEL - Abnormal; Notable for the following components:      Result Value   Sodium 131 (*)    BUN 36 (*)    Creatinine, Ser 1.12 (*)    Calcium 8.2 (*)    GFR, Estimated 46 (*)    All other components within normal limits  BRAIN NATRIURETIC PEPTIDE - Abnormal; Notable for the following components:   B Natriuretic Peptide 172.7 (*)    All other components within normal limits  TROPONIN I (HIGH SENSITIVITY) - Abnormal; Notable for the following components:   Troponin I (High Sensitivity) 18 (*)    All other components within normal limits  RESP PANEL BY RT-PCR (RSV, FLU A&B, COVID)  [id number]  CBC WITH DIFFERENTIAL/PLATELET  TROPONIN I (HIGH SENSITIVITY)   EKG: A-fib, rate approximately 90, QTc 470, no ST elevation or T wave depression None  Radiology DG Knee Right Port  Result Date: 12/09/2022 CLINICAL DATA:  Acute right knee pain. EXAM: PORTABLE RIGHT KNEE - 1-2 VIEW COMPARISON:  Radiographs 02/04/2021 FINDINGS: Advanced tricompartmental degenerative arthritis of the right knee similar to 02/04/2021. Small knee joint effusion. No acute fracture or dislocation. IMPRESSION: No evidence of acute fracture. Advanced tricompartmental degenerative arthritis. Electronically Signed   By: Minerva Fester M.D.   On: 12/09/2022 19:57   DG Chest 2  View  Result Date: 12/09/2022 CLINICAL DATA:  Low oxygen saturation and cyanosis EXAM: CHEST - 2 VIEW COMPARISON:  05/09/2022 FINDINGS: Hyperinflation and chronic bronchitic change. Patchy nodular opacities in the right mid to lower lung and left lower lung. No focal consolidation, pleural effusion, or pneumothorax. Biapical pleural-parenchymal scarring. Stable enlargement of the cardiomediastinal silhouette. Aortic atherosclerotic calcification. IMPRESSION: Findings compatible with bronchitis/reactive airways. Electronically Signed   By: Minerva Fester M.D.   On: 12/09/2022 19:55   VAS [place] LOWER EXTREMITY VENOUS (DVT) (7a-7p)  Result Date: 12/09/2022  Lower Venous DVT Study Patient  Name:  Vanessa Logan  Date of Exam:   12/09/2022 Medical Rec #: [id number]     Accession #:    [id number] Date of Birth: [birth year]     Patient Gender: F Patient Age:   77 years Exam Location:  [facility] Procedure:      VAS [place] LOWER EXTREMITY VENOUS (DVT) Referring Phys: JOSHUA LONG --------------------------------------------------------------------------------  Indications: Pain.  Risk Factors: None identified. Limitations: Poor ultrasound/tissue interface and patient positioning, patient immobility, patient pain tolerance. Comparison Study: No prior studies. Performing Technologist: Chanda Busing RVT  Examination Guidelines: A complete evaluation includes B-mode imaging, spectral Doppler, color Doppler, and power Doppler as needed of all accessible portions of each vessel. Bilateral testing is considered an integral part of a complete examination. Limited examinations for reoccurring indications may be performed as noted. The reflux portion of the exam is performed with the patient in reverse Trendelenburg.  +---------+---------------+---------+-----------+----------+--------------+ RIGHT    CompressibilityPhasicitySpontaneityPropertiesThrombus Aging  +---------+---------------+---------+-----------+----------+--------------+ CFV      Full           Yes      Yes                                 +---------+---------------+---------+-----------+----------+--------------+ SFJ      Full                                                        +---------+---------------+---------+-----------+----------+--------------+ FV Prox  Full                                                        +---------+---------------+---------+-----------+----------+--------------+ FV Mid   Full                                                        +---------+---------------+---------+-----------+----------+--------------+ FV DistalFull                                                        +---------+---------------+---------+-----------+----------+--------------+ PFV      Full                                                        +---------+---------------+---------+-----------+----------+--------------+ POP      Full           Yes      Yes                                 +---------+---------------+---------+-----------+----------+--------------+ PTV      Full                                                        +---------+---------------+---------+-----------+----------+--------------+  PERO     Full                                                        +---------+---------------+---------+-----------+----------+--------------+   +----+---------------+---------+-----------+----------+--------------+ LEFTCompressibilityPhasicitySpontaneityPropertiesThrombus Aging +----+---------------+---------+-----------+----------+--------------+ CFV Full           Yes      Yes                                 +----+---------------+---------+-----------+----------+--------------+    Summary: RIGHT: - There is no evidence of deep vein thrombosis in the lower extremity.  - No cystic structure found in the popliteal fossa.   LEFT: - No evidence of common femoral vein obstruction.   *See table(s) above for measurements and observations. Electronically signed by Sherald Hess MD on 12/09/2022 at 5:44:16 PM.    Final     Procedures Procedures    Medications Ordered in ED Medications  acetaminophen (TYLENOL) tablet 1,000 mg (1,000 mg Oral Given 12/09/22 2034)    ED Course/ Medical Decision Making/ A&P                                 Medical Decision Making 89 year old female with medical history of hypotension on midodrine, A-fib on Eliquis, CHF on Lasix, GERD who presents with hypoxia requiring 2 L [place] and secondary complaint of right knee pain.  She is comfortable appearing and not voicing pertinent positives regarding her shortness of breath.  Differential diagnosis considered pneumonia, worsening CHF, DVT/PE, anemia, ACS, viral illness.  PE unlikely as the require Eliquis failure, however with secondary complaint of right knee pain obtained DVT ultrasound which was negative.  Given absence of further symptoms, worsening CHF and pneumonia are less likely.  Normal CBC ruled out anemia.  EKG does not show ST elevation, troponin 18>16.  BNP of 172, below her prior values.  CXR negative for pneumonia.  Secondarily, her complaint of right knee pain is modestly concerning for osteoarthritis given she did not have recent trauma.  Right knee XR shows advanced tricompartmental degenerative arthritis, no evidence of fracture.  Tylenol provided for comfort.  During monitoring, hypoxia improved and patient had normal O2 sats on room air.  Given unremarkable workup, deemed stable for discharge.  Suspect this is potentially related to aspiration.         Final Clinical Impression(s) / ED Diagnoses Final diagnoses:  Shortness of breath  Primary osteoarthritis of right knee   Rx / DC Orders ED Discharge Orders     None         Shelby Mattocks, DO 12/09/22 2115    Maia Plan, MD 12/16/22 [phone]

## 2022-12-09 NOTE — ED Triage Notes (Signed)
Pt from [facility] after being found to have low o2 sat, cyanosis around mouth and nail beds, c/o "feeling of smothering", c/o right knee pain, pt always has edema on knee but pain is acute

## 2022-12-09 NOTE — ED Notes (Signed)
[facility] contacted 

## 2022-12-09 NOTE — Discharge Instructions (Addendum)
Her lab work and chest x-ray did not reveal any acute findings, she improved without interventional measures back to room air.  I suspect this is related to an aspiration process.  Please look out for any further signs of shortness of breath which may prompt her return.  For the right knee pain, she has significant arthritis.  There was no DVT on ultrasound.  You may use Voltaren gel, Tylenol or ibuprofen, heat packs in this area for pain management.

## 2023-03-26 DEATH — deceased
